# Patient Record
Sex: Female | Born: 1940 | Race: Black or African American | Hispanic: No | Smoking: Never smoker
Health system: Southern US, Community
[De-identification: ages and names within clinical notes are randomized; demographics above are authoritative.]

## PROBLEM LIST (undated history)

## (undated) DIAGNOSIS — F0390 Unspecified dementia without behavioral disturbance: Secondary | ICD-10-CM

## (undated) DIAGNOSIS — N189 Chronic kidney disease, unspecified: Secondary | ICD-10-CM

## (undated) DIAGNOSIS — H269 Unspecified cataract: Secondary | ICD-10-CM

## (undated) DIAGNOSIS — M810 Age-related osteoporosis without current pathological fracture: Secondary | ICD-10-CM

## (undated) DIAGNOSIS — I700 Atherosclerosis of aorta: Secondary | ICD-10-CM

## (undated) DIAGNOSIS — K219 Gastro-esophageal reflux disease without esophagitis: Secondary | ICD-10-CM

## (undated) DIAGNOSIS — I4821 Permanent atrial fibrillation: Secondary | ICD-10-CM

## (undated) DIAGNOSIS — M1990 Unspecified osteoarthritis, unspecified site: Secondary | ICD-10-CM

## (undated) DIAGNOSIS — K5790 Diverticulosis of intestine, part unspecified, without perforation or abscess without bleeding: Secondary | ICD-10-CM

## (undated) DIAGNOSIS — K922 Gastrointestinal hemorrhage, unspecified: Secondary | ICD-10-CM

## (undated) DIAGNOSIS — I10 Essential (primary) hypertension: Secondary | ICD-10-CM

## (undated) DIAGNOSIS — I5189 Other ill-defined heart diseases: Secondary | ICD-10-CM

## (undated) DIAGNOSIS — Z8669 Personal history of other diseases of the nervous system and sense organs: Secondary | ICD-10-CM

## (undated) DIAGNOSIS — I509 Heart failure, unspecified: Secondary | ICD-10-CM

## (undated) DIAGNOSIS — I429 Cardiomyopathy, unspecified: Secondary | ICD-10-CM

## (undated) DIAGNOSIS — I442 Atrioventricular block, complete: Secondary | ICD-10-CM

## (undated) DIAGNOSIS — M109 Gout, unspecified: Secondary | ICD-10-CM

## (undated) DIAGNOSIS — Z5189 Encounter for other specified aftercare: Secondary | ICD-10-CM

## (undated) DIAGNOSIS — IMO0001 Reserved for inherently not codable concepts without codable children: Secondary | ICD-10-CM

## (undated) DIAGNOSIS — T8092XA Unspecified transfusion reaction, initial encounter: Secondary | ICD-10-CM

## (undated) DIAGNOSIS — D509 Iron deficiency anemia, unspecified: Secondary | ICD-10-CM

## (undated) DIAGNOSIS — R011 Cardiac murmur, unspecified: Secondary | ICD-10-CM

## (undated) DIAGNOSIS — Z86010 Personal history of colonic polyps: Secondary | ICD-10-CM

## (undated) DIAGNOSIS — K279 Peptic ulcer, site unspecified, unspecified as acute or chronic, without hemorrhage or perforation: Secondary | ICD-10-CM

## (undated) HISTORY — DX: Unspecified cataract: H26.9

## (undated) HISTORY — PX: CARDIAC PACEMAKER PLACEMENT: SHX583

## (undated) HISTORY — DX: Age-related osteoporosis without current pathological fracture: M81.0

## (undated) HISTORY — DX: Atrioventricular block, complete: I44.2

## (undated) HISTORY — DX: Personal history of other diseases of the nervous system and sense organs: Z86.69

## (undated) HISTORY — DX: Atherosclerosis of aorta: I70.0

## (undated) HISTORY — DX: Personal history of colonic polyps: Z86.010

## (undated) HISTORY — DX: Diverticulosis of intestine, part unspecified, without perforation or abscess without bleeding: K57.90

## (undated) HISTORY — PX: FETAL BLOOD TRANSFUSION: SHX1602

## (undated) HISTORY — DX: Gastro-esophageal reflux disease without esophagitis: K21.9

## (undated) HISTORY — DX: Unspecified dementia, unspecified severity, without behavioral disturbance, psychotic disturbance, mood disturbance, and anxiety: F03.90

## (undated) HISTORY — DX: Other ill-defined heart diseases: I51.89

## (undated) HISTORY — DX: Cardiomyopathy, unspecified: I42.9

## (undated) HISTORY — PX: UPPER GASTROINTESTINAL ENDOSCOPY: SHX188

## (undated) HISTORY — DX: Reserved for inherently not codable concepts without codable children: IMO0001

## (undated) HISTORY — DX: Chronic kidney disease, unspecified: N18.9

## (undated) HISTORY — DX: Essential (primary) hypertension: I10

## (undated) HISTORY — DX: Unspecified transfusion reaction, initial encounter: T80.92XA

## (undated) HISTORY — DX: Encounter for other specified aftercare: Z51.89

## (undated) HISTORY — PX: COLONOSCOPY: SHX174

## (undated) HISTORY — DX: Heart failure, unspecified: I50.9

## (undated) HISTORY — DX: Unspecified osteoarthritis, unspecified site: M19.90

## (undated) HISTORY — DX: Gastrointestinal hemorrhage, unspecified: K92.2

## (undated) HISTORY — DX: Peptic ulcer, site unspecified, unspecified as acute or chronic, without hemorrhage or perforation: K27.9

## (undated) HISTORY — DX: Permanent atrial fibrillation: I48.21

## (undated) HISTORY — DX: Gout, unspecified: M10.9

## (undated) HISTORY — DX: Cardiac murmur, unspecified: R01.1

## (undated) HISTORY — DX: Iron deficiency anemia, unspecified: D50.9

---

## 1993-07-23 HISTORY — PX: BILROTH I PROCEDURE: SHX1231

## 1993-07-23 HISTORY — PX: VAGOTOMY: SUR1431

## 1997-08-24 ENCOUNTER — Ambulatory Visit (HOSPITAL_COMMUNITY): Admission: RE | Admit: 1997-08-24 | Discharge: 1997-08-24 | Payer: Self-pay | Admitting: Obstetrics

## 1997-10-29 ENCOUNTER — Encounter: Admission: RE | Admit: 1997-10-29 | Discharge: 1997-10-29 | Payer: Self-pay | Admitting: Family Medicine

## 1997-11-04 ENCOUNTER — Ambulatory Visit (HOSPITAL_COMMUNITY): Admission: RE | Admit: 1997-11-04 | Discharge: 1997-11-04 | Payer: Self-pay | Admitting: Internal Medicine

## 1997-11-09 ENCOUNTER — Encounter: Admission: RE | Admit: 1997-11-09 | Discharge: 1997-11-09 | Payer: Self-pay | Admitting: Family Medicine

## 1997-11-29 ENCOUNTER — Encounter: Admission: RE | Admit: 1997-11-29 | Discharge: 1997-11-29 | Payer: Self-pay | Admitting: Family Medicine

## 1998-05-16 ENCOUNTER — Encounter: Admission: RE | Admit: 1998-05-16 | Discharge: 1998-05-16 | Payer: Self-pay | Admitting: Family Medicine

## 1998-06-15 ENCOUNTER — Encounter: Admission: RE | Admit: 1998-06-15 | Discharge: 1998-06-15 | Payer: Self-pay | Admitting: Family Medicine

## 1998-08-11 ENCOUNTER — Encounter: Payer: Self-pay | Admitting: Family Medicine

## 1998-08-11 ENCOUNTER — Inpatient Hospital Stay (HOSPITAL_COMMUNITY): Admission: EM | Admit: 1998-08-11 | Discharge: 1998-08-14 | Payer: Self-pay | Admitting: Emergency Medicine

## 1998-10-04 ENCOUNTER — Encounter: Admission: RE | Admit: 1998-10-04 | Discharge: 1998-10-04 | Payer: Self-pay | Admitting: Sports Medicine

## 1998-11-03 ENCOUNTER — Encounter: Admission: RE | Admit: 1998-11-03 | Discharge: 1998-11-03 | Payer: Self-pay | Admitting: Family Medicine

## 1999-01-09 ENCOUNTER — Encounter: Admission: RE | Admit: 1999-01-09 | Discharge: 1999-01-09 | Payer: Self-pay | Admitting: Family Medicine

## 1999-04-04 ENCOUNTER — Ambulatory Visit (HOSPITAL_COMMUNITY): Admission: RE | Admit: 1999-04-04 | Discharge: 1999-04-04 | Payer: Self-pay | Admitting: *Deleted

## 1999-04-24 ENCOUNTER — Encounter: Admission: RE | Admit: 1999-04-24 | Discharge: 1999-04-24 | Payer: Self-pay | Admitting: Family Medicine

## 1999-11-15 ENCOUNTER — Encounter: Admission: RE | Admit: 1999-11-15 | Discharge: 1999-11-15 | Payer: Self-pay | Admitting: Family Medicine

## 1999-12-05 ENCOUNTER — Ambulatory Visit (HOSPITAL_COMMUNITY): Admission: RE | Admit: 1999-12-05 | Discharge: 1999-12-05 | Payer: Self-pay | Admitting: Family Medicine

## 1999-12-07 ENCOUNTER — Encounter: Admission: RE | Admit: 1999-12-07 | Discharge: 1999-12-07 | Payer: Self-pay | Admitting: Family Medicine

## 1999-12-19 ENCOUNTER — Other Ambulatory Visit: Admission: RE | Admit: 1999-12-19 | Discharge: 1999-12-19 | Payer: Self-pay | Admitting: Podiatry

## 1999-12-25 ENCOUNTER — Emergency Department (HOSPITAL_COMMUNITY): Admission: EM | Admit: 1999-12-25 | Discharge: 1999-12-25 | Payer: Self-pay | Admitting: Emergency Medicine

## 1999-12-26 ENCOUNTER — Encounter: Payer: Self-pay | Admitting: Emergency Medicine

## 2000-01-06 ENCOUNTER — Encounter: Payer: Self-pay | Admitting: Emergency Medicine

## 2000-01-06 ENCOUNTER — Emergency Department (HOSPITAL_COMMUNITY): Admission: EM | Admit: 2000-01-06 | Discharge: 2000-01-06 | Payer: Self-pay | Admitting: Emergency Medicine

## 2000-03-12 ENCOUNTER — Encounter: Admission: RE | Admit: 2000-03-12 | Discharge: 2000-03-12 | Payer: Self-pay | Admitting: Family Medicine

## 2000-04-30 ENCOUNTER — Encounter: Admission: RE | Admit: 2000-04-30 | Discharge: 2000-04-30 | Payer: Self-pay | Admitting: Family Medicine

## 2000-05-03 ENCOUNTER — Encounter: Admission: RE | Admit: 2000-05-03 | Discharge: 2000-05-03 | Payer: Self-pay | Admitting: Family Medicine

## 2000-05-31 ENCOUNTER — Encounter: Admission: RE | Admit: 2000-05-31 | Discharge: 2000-05-31 | Payer: Self-pay | Admitting: Family Medicine

## 2000-11-06 ENCOUNTER — Encounter: Admission: RE | Admit: 2000-11-06 | Discharge: 2000-11-06 | Payer: Self-pay | Admitting: Family Medicine

## 2001-01-24 ENCOUNTER — Encounter: Admission: RE | Admit: 2001-01-24 | Discharge: 2001-01-24 | Payer: Self-pay | Admitting: Family Medicine

## 2001-01-29 ENCOUNTER — Ambulatory Visit (HOSPITAL_COMMUNITY): Admission: RE | Admit: 2001-01-29 | Discharge: 2001-01-29 | Payer: Self-pay | Admitting: Family Medicine

## 2001-02-17 ENCOUNTER — Encounter: Admission: RE | Admit: 2001-02-17 | Discharge: 2001-02-17 | Payer: Self-pay | Admitting: Family Medicine

## 2001-09-29 ENCOUNTER — Encounter: Admission: RE | Admit: 2001-09-29 | Discharge: 2001-09-29 | Payer: Self-pay | Admitting: Family Medicine

## 2002-02-24 ENCOUNTER — Encounter: Admission: RE | Admit: 2002-02-24 | Discharge: 2002-02-24 | Payer: Self-pay | Admitting: Sports Medicine

## 2002-03-31 ENCOUNTER — Encounter: Admission: RE | Admit: 2002-03-31 | Discharge: 2002-03-31 | Payer: Self-pay | Admitting: Family Medicine

## 2002-05-05 ENCOUNTER — Encounter: Admission: RE | Admit: 2002-05-05 | Discharge: 2002-05-05 | Payer: Self-pay | Admitting: Sports Medicine

## 2002-06-22 LAB — POC HEMOCCULT BLD/STL (HOME/3-CARD/SCREEN)

## 2002-07-13 ENCOUNTER — Encounter: Admission: RE | Admit: 2002-07-13 | Discharge: 2002-07-13 | Payer: Self-pay | Admitting: Family Medicine

## 2002-07-14 ENCOUNTER — Encounter: Payer: Self-pay | Admitting: Cardiology

## 2002-07-14 ENCOUNTER — Inpatient Hospital Stay (HOSPITAL_COMMUNITY): Admission: AD | Admit: 2002-07-14 | Discharge: 2002-07-22 | Payer: Self-pay | Admitting: Internal Medicine

## 2002-07-14 ENCOUNTER — Encounter: Payer: Self-pay | Admitting: Internal Medicine

## 2002-07-20 ENCOUNTER — Encounter (INDEPENDENT_AMBULATORY_CARE_PROVIDER_SITE_OTHER): Payer: Self-pay | Admitting: *Deleted

## 2002-08-03 ENCOUNTER — Encounter: Admission: RE | Admit: 2002-08-03 | Discharge: 2002-08-03 | Payer: Self-pay | Admitting: Family Medicine

## 2002-08-20 ENCOUNTER — Encounter: Admission: RE | Admit: 2002-08-20 | Discharge: 2002-08-20 | Payer: Self-pay | Admitting: Family Medicine

## 2002-09-16 ENCOUNTER — Encounter: Admission: RE | Admit: 2002-09-16 | Discharge: 2002-09-16 | Payer: Self-pay | Admitting: Family Medicine

## 2002-09-24 ENCOUNTER — Encounter: Payer: Self-pay | Admitting: Family Medicine

## 2002-09-24 ENCOUNTER — Ambulatory Visit (HOSPITAL_COMMUNITY): Admission: RE | Admit: 2002-09-24 | Discharge: 2002-09-24 | Payer: Self-pay | Admitting: Family Medicine

## 2002-11-20 ENCOUNTER — Encounter: Admission: RE | Admit: 2002-11-20 | Discharge: 2002-11-20 | Payer: Self-pay | Admitting: Family Medicine

## 2002-12-22 ENCOUNTER — Encounter (INDEPENDENT_AMBULATORY_CARE_PROVIDER_SITE_OTHER): Payer: Self-pay | Admitting: *Deleted

## 2002-12-31 ENCOUNTER — Encounter: Admission: RE | Admit: 2002-12-31 | Discharge: 2002-12-31 | Payer: Self-pay | Admitting: Family Medicine

## 2003-01-15 ENCOUNTER — Encounter: Admission: RE | Admit: 2003-01-15 | Discharge: 2003-01-15 | Payer: Self-pay | Admitting: Family Medicine

## 2003-02-03 ENCOUNTER — Encounter: Admission: RE | Admit: 2003-02-03 | Discharge: 2003-02-03 | Payer: Self-pay | Admitting: Family Medicine

## 2003-03-19 ENCOUNTER — Encounter: Admission: RE | Admit: 2003-03-19 | Discharge: 2003-03-19 | Payer: Self-pay | Admitting: Family Medicine

## 2003-04-21 ENCOUNTER — Encounter: Admission: RE | Admit: 2003-04-21 | Discharge: 2003-04-21 | Payer: Self-pay | Admitting: Family Medicine

## 2003-05-17 ENCOUNTER — Encounter: Admission: RE | Admit: 2003-05-17 | Discharge: 2003-05-17 | Payer: Self-pay | Admitting: Family Medicine

## 2003-08-07 ENCOUNTER — Inpatient Hospital Stay (HOSPITAL_COMMUNITY): Admission: EM | Admit: 2003-08-07 | Discharge: 2003-08-09 | Payer: Self-pay | Admitting: Emergency Medicine

## 2003-08-13 ENCOUNTER — Encounter: Admission: RE | Admit: 2003-08-13 | Discharge: 2003-08-13 | Payer: Self-pay | Admitting: Family Medicine

## 2003-09-17 ENCOUNTER — Encounter: Admission: RE | Admit: 2003-09-17 | Discharge: 2003-09-17 | Payer: Self-pay | Admitting: Family Medicine

## 2003-10-01 ENCOUNTER — Encounter: Admission: RE | Admit: 2003-10-01 | Discharge: 2003-10-01 | Payer: Self-pay | Admitting: Family Medicine

## 2004-01-14 ENCOUNTER — Encounter: Admission: RE | Admit: 2004-01-14 | Discharge: 2004-01-14 | Payer: Self-pay | Admitting: Sports Medicine

## 2004-01-17 ENCOUNTER — Encounter: Admission: RE | Admit: 2004-01-17 | Discharge: 2004-01-17 | Payer: Self-pay | Admitting: Sports Medicine

## 2004-02-15 ENCOUNTER — Encounter: Admission: RE | Admit: 2004-02-15 | Discharge: 2004-02-15 | Payer: Self-pay | Admitting: Sports Medicine

## 2004-04-06 ENCOUNTER — Ambulatory Visit: Payer: Self-pay | Admitting: Family Medicine

## 2004-11-22 ENCOUNTER — Ambulatory Visit: Payer: Self-pay | Admitting: Cardiology

## 2005-02-19 ENCOUNTER — Ambulatory Visit: Payer: Self-pay | Admitting: Sports Medicine

## 2005-02-23 ENCOUNTER — Ambulatory Visit: Payer: Self-pay | Admitting: Cardiology

## 2005-02-23 ENCOUNTER — Ambulatory Visit: Payer: Self-pay | Admitting: Family Medicine

## 2005-03-02 ENCOUNTER — Ambulatory Visit: Payer: Self-pay | Admitting: Family Medicine

## 2005-03-09 ENCOUNTER — Ambulatory Visit: Payer: Self-pay | Admitting: Family Medicine

## 2005-03-16 ENCOUNTER — Ambulatory Visit: Payer: Self-pay | Admitting: Family Medicine

## 2005-03-23 ENCOUNTER — Ambulatory Visit: Payer: Self-pay | Admitting: Sports Medicine

## 2005-03-30 ENCOUNTER — Ambulatory Visit: Payer: Self-pay | Admitting: Family Medicine

## 2005-04-13 ENCOUNTER — Ambulatory Visit: Payer: Self-pay | Admitting: Family Medicine

## 2005-04-30 ENCOUNTER — Ambulatory Visit: Payer: Self-pay | Admitting: Family Medicine

## 2005-05-21 ENCOUNTER — Ambulatory Visit: Payer: Self-pay | Admitting: Sports Medicine

## 2005-05-22 ENCOUNTER — Ambulatory Visit: Payer: Self-pay | Admitting: Family Medicine

## 2005-08-22 ENCOUNTER — Ambulatory Visit (HOSPITAL_COMMUNITY): Admission: RE | Admit: 2005-08-22 | Discharge: 2005-08-22 | Payer: Self-pay | Admitting: Sports Medicine

## 2005-08-23 DIAGNOSIS — K279 Peptic ulcer, site unspecified, unspecified as acute or chronic, without hemorrhage or perforation: Secondary | ICD-10-CM

## 2005-08-23 HISTORY — DX: Peptic ulcer, site unspecified, unspecified as acute or chronic, without hemorrhage or perforation: K27.9

## 2005-09-16 ENCOUNTER — Ambulatory Visit: Payer: Self-pay | Admitting: Gastroenterology

## 2005-09-16 ENCOUNTER — Inpatient Hospital Stay (HOSPITAL_COMMUNITY): Admission: EM | Admit: 2005-09-16 | Discharge: 2005-09-19 | Payer: Self-pay | Admitting: Emergency Medicine

## 2005-09-16 ENCOUNTER — Ambulatory Visit: Payer: Self-pay | Admitting: Family Medicine

## 2005-10-01 ENCOUNTER — Ambulatory Visit: Payer: Self-pay | Admitting: Sports Medicine

## 2005-11-20 DIAGNOSIS — K5790 Diverticulosis of intestine, part unspecified, without perforation or abscess without bleeding: Secondary | ICD-10-CM

## 2005-11-20 HISTORY — DX: Diverticulosis of intestine, part unspecified, without perforation or abscess without bleeding: K57.90

## 2005-12-13 ENCOUNTER — Ambulatory Visit: Payer: Self-pay | Admitting: Cardiology

## 2005-12-19 ENCOUNTER — Ambulatory Visit (HOSPITAL_COMMUNITY): Admission: RE | Admit: 2005-12-19 | Discharge: 2005-12-19 | Payer: Self-pay | Admitting: Gastroenterology

## 2006-05-27 ENCOUNTER — Ambulatory Visit: Payer: Self-pay

## 2006-08-28 ENCOUNTER — Ambulatory Visit: Payer: Self-pay | Admitting: Cardiology

## 2006-09-19 DIAGNOSIS — IMO0001 Reserved for inherently not codable concepts without codable children: Secondary | ICD-10-CM

## 2006-09-19 DIAGNOSIS — I428 Other cardiomyopathies: Secondary | ICD-10-CM

## 2006-09-19 DIAGNOSIS — M545 Low back pain: Secondary | ICD-10-CM

## 2006-09-19 DIAGNOSIS — K219 Gastro-esophageal reflux disease without esophagitis: Secondary | ICD-10-CM

## 2006-09-19 DIAGNOSIS — M1710 Unilateral primary osteoarthritis, unspecified knee: Secondary | ICD-10-CM

## 2006-09-19 DIAGNOSIS — IMO0002 Reserved for concepts with insufficient information to code with codable children: Secondary | ICD-10-CM | POA: Insufficient documentation

## 2006-09-19 DIAGNOSIS — M2540 Effusion, unspecified joint: Secondary | ICD-10-CM | POA: Insufficient documentation

## 2006-09-19 DIAGNOSIS — D509 Iron deficiency anemia, unspecified: Secondary | ICD-10-CM

## 2006-09-20 ENCOUNTER — Encounter (INDEPENDENT_AMBULATORY_CARE_PROVIDER_SITE_OTHER): Payer: Self-pay | Admitting: *Deleted

## 2006-11-27 ENCOUNTER — Ambulatory Visit: Payer: Self-pay | Admitting: Cardiology

## 2007-01-13 ENCOUNTER — Ambulatory Visit: Payer: Self-pay | Admitting: Cardiology

## 2007-04-16 ENCOUNTER — Ambulatory Visit: Payer: Self-pay | Admitting: Cardiology

## 2007-05-26 ENCOUNTER — Ambulatory Visit: Payer: Self-pay

## 2007-06-25 ENCOUNTER — Encounter (INDEPENDENT_AMBULATORY_CARE_PROVIDER_SITE_OTHER): Payer: Self-pay | Admitting: *Deleted

## 2007-07-14 ENCOUNTER — Telehealth: Payer: Self-pay | Admitting: *Deleted

## 2007-07-21 ENCOUNTER — Ambulatory Visit: Payer: Self-pay | Admitting: Family Medicine

## 2007-08-19 ENCOUNTER — Ambulatory Visit: Payer: Self-pay | Admitting: Family Medicine

## 2007-08-22 ENCOUNTER — Ambulatory Visit (HOSPITAL_COMMUNITY): Admission: RE | Admit: 2007-08-22 | Discharge: 2007-08-22 | Payer: Self-pay | Admitting: Family Medicine

## 2007-08-27 ENCOUNTER — Ambulatory Visit: Payer: Self-pay | Admitting: Cardiology

## 2007-09-18 ENCOUNTER — Encounter: Payer: Self-pay | Admitting: Family Medicine

## 2007-09-18 ENCOUNTER — Ambulatory Visit: Payer: Self-pay | Admitting: Family Medicine

## 2007-09-18 LAB — CONVERTED CEMR LAB
BUN: 17 mg/dL (ref 6–23)
Calcium: 8.7 mg/dL (ref 8.4–10.5)
Cholesterol: 139 mg/dL (ref 0–200)
Creatinine, Ser: 0.81 mg/dL (ref 0.40–1.20)
Glucose, Bld: 62 mg/dL — ABNORMAL LOW (ref 70–99)
HCT: 39.6 % (ref 36.0–46.0)
Hemoglobin: 11.8 g/dL — ABNORMAL LOW (ref 12.0–15.0)
MCHC: 29.8 g/dL — ABNORMAL LOW (ref 30.0–36.0)
MCV: 96.4 fL (ref 78.0–100.0)
Potassium: 3.6 meq/L (ref 3.5–5.3)
RDW: 14.6 % (ref 11.5–15.5)
Triglycerides: 82 mg/dL (ref <150)

## 2007-09-22 ENCOUNTER — Encounter: Payer: Self-pay | Admitting: Family Medicine

## 2007-10-01 ENCOUNTER — Ambulatory Visit (HOSPITAL_COMMUNITY): Admission: RE | Admit: 2007-10-01 | Discharge: 2007-10-01 | Payer: Self-pay | Admitting: Family Medicine

## 2007-10-01 ENCOUNTER — Ambulatory Visit: Payer: Self-pay | Admitting: Family Medicine

## 2007-10-01 DIAGNOSIS — R42 Dizziness and giddiness: Secondary | ICD-10-CM

## 2007-10-06 ENCOUNTER — Encounter: Payer: Self-pay | Admitting: Family Medicine

## 2007-12-22 ENCOUNTER — Ambulatory Visit: Payer: Self-pay | Admitting: Cardiology

## 2007-12-30 ENCOUNTER — Ambulatory Visit: Payer: Self-pay | Admitting: Family Medicine

## 2008-03-24 ENCOUNTER — Ambulatory Visit: Payer: Self-pay | Admitting: Cardiology

## 2008-07-06 ENCOUNTER — Ambulatory Visit: Payer: Self-pay | Admitting: Cardiology

## 2008-07-08 ENCOUNTER — Ambulatory Visit: Payer: Self-pay | Admitting: Family Medicine

## 2008-07-08 DIAGNOSIS — R040 Epistaxis: Secondary | ICD-10-CM

## 2008-07-09 ENCOUNTER — Emergency Department (HOSPITAL_COMMUNITY): Admission: EM | Admit: 2008-07-09 | Discharge: 2008-07-09 | Payer: Self-pay | Admitting: Emergency Medicine

## 2008-07-10 ENCOUNTER — Emergency Department (HOSPITAL_COMMUNITY): Admission: EM | Admit: 2008-07-10 | Discharge: 2008-07-10 | Payer: Self-pay | Admitting: Emergency Medicine

## 2008-09-01 ENCOUNTER — Ambulatory Visit (HOSPITAL_COMMUNITY): Admission: RE | Admit: 2008-09-01 | Discharge: 2008-09-01 | Payer: Self-pay | Admitting: Family Medicine

## 2008-09-10 ENCOUNTER — Ambulatory Visit (HOSPITAL_COMMUNITY): Admission: RE | Admit: 2008-09-10 | Discharge: 2008-09-10 | Payer: Self-pay | Admitting: Family Medicine

## 2008-10-05 ENCOUNTER — Telehealth: Payer: Self-pay | Admitting: Family Medicine

## 2008-10-05 ENCOUNTER — Ambulatory Visit: Payer: Self-pay | Admitting: Family Medicine

## 2008-10-05 DIAGNOSIS — M25569 Pain in unspecified knee: Secondary | ICD-10-CM

## 2008-10-11 ENCOUNTER — Ambulatory Visit: Payer: Self-pay | Admitting: Cardiology

## 2008-11-11 ENCOUNTER — Encounter (INDEPENDENT_AMBULATORY_CARE_PROVIDER_SITE_OTHER): Payer: Self-pay | Admitting: *Deleted

## 2008-12-22 DIAGNOSIS — I10 Essential (primary) hypertension: Secondary | ICD-10-CM

## 2008-12-22 DIAGNOSIS — I4821 Permanent atrial fibrillation: Secondary | ICD-10-CM

## 2008-12-22 DIAGNOSIS — Z950 Presence of cardiac pacemaker: Secondary | ICD-10-CM

## 2008-12-22 DIAGNOSIS — Z8711 Personal history of peptic ulcer disease: Secondary | ICD-10-CM

## 2008-12-22 DIAGNOSIS — I442 Atrioventricular block, complete: Secondary | ICD-10-CM

## 2008-12-22 DIAGNOSIS — I5032 Chronic diastolic (congestive) heart failure: Secondary | ICD-10-CM

## 2008-12-22 DIAGNOSIS — Z8679 Personal history of other diseases of the circulatory system: Secondary | ICD-10-CM | POA: Insufficient documentation

## 2008-12-23 ENCOUNTER — Ambulatory Visit: Payer: Self-pay | Admitting: Cardiology

## 2009-01-05 ENCOUNTER — Encounter: Payer: Self-pay | Admitting: Cardiology

## 2009-01-05 ENCOUNTER — Ambulatory Visit: Payer: Self-pay

## 2009-01-10 ENCOUNTER — Telehealth: Payer: Self-pay | Admitting: Cardiology

## 2009-03-28 ENCOUNTER — Ambulatory Visit: Payer: Self-pay | Admitting: Cardiology

## 2009-04-22 DIAGNOSIS — K922 Gastrointestinal hemorrhage, unspecified: Secondary | ICD-10-CM

## 2009-04-22 HISTORY — DX: Gastrointestinal hemorrhage, unspecified: K92.2

## 2009-04-29 ENCOUNTER — Encounter: Payer: Self-pay | Admitting: Family Medicine

## 2009-04-29 ENCOUNTER — Ambulatory Visit: Payer: Self-pay | Admitting: Family Medicine

## 2009-04-29 ENCOUNTER — Ambulatory Visit: Payer: Self-pay | Admitting: Internal Medicine

## 2009-04-29 ENCOUNTER — Inpatient Hospital Stay (HOSPITAL_COMMUNITY): Admission: EM | Admit: 2009-04-29 | Discharge: 2009-05-02 | Payer: Self-pay | Admitting: Emergency Medicine

## 2009-04-30 ENCOUNTER — Encounter: Payer: Self-pay | Admitting: Internal Medicine

## 2009-05-02 ENCOUNTER — Encounter: Payer: Self-pay | Admitting: Family Medicine

## 2009-05-05 ENCOUNTER — Ambulatory Visit: Payer: Self-pay | Admitting: Family Medicine

## 2009-05-05 DIAGNOSIS — L259 Unspecified contact dermatitis, unspecified cause: Secondary | ICD-10-CM

## 2009-05-06 ENCOUNTER — Encounter: Payer: Self-pay | Admitting: Cardiology

## 2009-06-14 ENCOUNTER — Ambulatory Visit: Payer: Self-pay | Admitting: Family Medicine

## 2009-06-14 DIAGNOSIS — L293 Anogenital pruritus, unspecified: Secondary | ICD-10-CM

## 2009-06-29 ENCOUNTER — Encounter: Payer: Self-pay | Admitting: Internal Medicine

## 2009-07-07 ENCOUNTER — Encounter: Payer: Self-pay | Admitting: Internal Medicine

## 2009-09-30 ENCOUNTER — Encounter (INDEPENDENT_AMBULATORY_CARE_PROVIDER_SITE_OTHER): Payer: Self-pay | Admitting: *Deleted

## 2009-10-03 ENCOUNTER — Ambulatory Visit: Payer: Self-pay | Admitting: Cardiology

## 2009-10-03 ENCOUNTER — Telehealth: Payer: Self-pay | Admitting: Cardiology

## 2009-10-11 ENCOUNTER — Inpatient Hospital Stay (HOSPITAL_COMMUNITY): Admission: EM | Admit: 2009-10-11 | Discharge: 2009-10-13 | Payer: Self-pay | Admitting: Emergency Medicine

## 2009-10-11 ENCOUNTER — Ambulatory Visit: Payer: Self-pay | Admitting: Internal Medicine

## 2009-10-12 ENCOUNTER — Encounter: Payer: Self-pay | Admitting: Cardiology

## 2009-10-12 DIAGNOSIS — I5189 Other ill-defined heart diseases: Secondary | ICD-10-CM

## 2009-10-12 HISTORY — DX: Other ill-defined heart diseases: I51.89

## 2009-11-08 ENCOUNTER — Encounter: Payer: Self-pay | Admitting: Cardiology

## 2009-11-09 ENCOUNTER — Ambulatory Visit: Payer: Self-pay | Admitting: Cardiology

## 2009-11-14 ENCOUNTER — Telehealth: Payer: Self-pay | Admitting: Cardiology

## 2009-11-14 LAB — CONVERTED CEMR LAB
Basophils Absolute: 0 10*3/uL (ref 0.0–0.1)
Basophils Relative: 0.3 % (ref 0.0–3.0)
Eosinophils Absolute: 0.2 10*3/uL (ref 0.0–0.7)
HCT: 38 % (ref 36.0–46.0)
Hemoglobin: 12.4 g/dL (ref 12.0–15.0)
Lymphs Abs: 1.3 10*3/uL (ref 0.7–4.0)
MCHC: 32.7 g/dL (ref 30.0–36.0)
MCV: 91.6 fL (ref 78.0–100.0)
Neutro Abs: 5.2 10*3/uL (ref 1.4–7.7)
RBC: 4.15 M/uL (ref 3.87–5.11)
RDW: 14.6 % (ref 11.5–14.6)
Transferrin: 290.2 mg/dL (ref 212.0–360.0)

## 2009-11-24 ENCOUNTER — Telehealth: Payer: Self-pay | Admitting: Family Medicine

## 2009-11-25 ENCOUNTER — Ambulatory Visit: Payer: Self-pay | Admitting: Family Medicine

## 2009-11-25 ENCOUNTER — Encounter: Payer: Self-pay | Admitting: Family Medicine

## 2010-02-06 ENCOUNTER — Ambulatory Visit: Payer: Self-pay | Admitting: Cardiology

## 2010-03-28 ENCOUNTER — Ambulatory Visit: Payer: Self-pay | Admitting: Family Medicine

## 2010-03-28 ENCOUNTER — Encounter: Payer: Self-pay | Admitting: Family Medicine

## 2010-03-28 LAB — CONVERTED CEMR LAB
BUN: 13 mg/dL (ref 6–23)
CO2: 24 meq/L (ref 19–32)
Chloride: 100 meq/L (ref 96–112)
Creatinine, Ser: 0.96 mg/dL (ref 0.40–1.20)
Potassium: 3.7 meq/L (ref 3.5–5.3)

## 2010-03-30 ENCOUNTER — Ambulatory Visit: Payer: Self-pay | Admitting: Family Medicine

## 2010-03-30 ENCOUNTER — Telehealth: Payer: Self-pay | Admitting: Family Medicine

## 2010-03-30 DIAGNOSIS — Z8669 Personal history of other diseases of the nervous system and sense organs: Secondary | ICD-10-CM

## 2010-03-30 DIAGNOSIS — R634 Abnormal weight loss: Secondary | ICD-10-CM | POA: Insufficient documentation

## 2010-04-03 ENCOUNTER — Encounter: Payer: Self-pay | Admitting: Family Medicine

## 2010-04-05 ENCOUNTER — Ambulatory Visit: Payer: Self-pay | Admitting: Family Medicine

## 2010-04-05 ENCOUNTER — Encounter: Payer: Self-pay | Admitting: Family Medicine

## 2010-04-05 ENCOUNTER — Inpatient Hospital Stay (HOSPITAL_COMMUNITY): Admission: EM | Admit: 2010-04-05 | Discharge: 2010-04-18 | Payer: Self-pay | Admitting: Emergency Medicine

## 2010-04-16 LAB — CONVERTED CEMR LAB
BUN: 6 mg/dL
Creatinine, Ser: 0.8 mg/dL
Glucose, Bld: 101 mg/dL
Hemoglobin: 10 g/dL — ABNORMAL LOW
Potassium: 3.8 meq/L
RBC: 3.44 M/uL — ABNORMAL LOW
WBC: 7.6 10*3/uL

## 2010-04-24 ENCOUNTER — Ambulatory Visit: Payer: Self-pay | Admitting: Family Medicine

## 2010-04-25 ENCOUNTER — Encounter: Payer: Self-pay | Admitting: Family Medicine

## 2010-05-01 ENCOUNTER — Ambulatory Visit (HOSPITAL_COMMUNITY): Admission: RE | Admit: 2010-05-01 | Discharge: 2010-05-01 | Payer: Self-pay | Admitting: Family Medicine

## 2010-05-03 ENCOUNTER — Encounter: Payer: Self-pay | Admitting: Cardiology

## 2010-05-04 ENCOUNTER — Encounter
Admission: RE | Admit: 2010-05-04 | Discharge: 2010-08-02 | Payer: Self-pay | Source: Home / Self Care | Attending: Neurology | Admitting: Neurology

## 2010-05-11 ENCOUNTER — Encounter: Payer: Self-pay | Admitting: Internal Medicine

## 2010-05-11 ENCOUNTER — Ambulatory Visit: Payer: Self-pay | Admitting: Cardiology

## 2010-05-26 ENCOUNTER — Encounter (INDEPENDENT_AMBULATORY_CARE_PROVIDER_SITE_OTHER): Payer: Self-pay | Admitting: *Deleted

## 2010-08-10 ENCOUNTER — Ambulatory Visit
Admission: RE | Admit: 2010-08-10 | Discharge: 2010-08-10 | Payer: Self-pay | Source: Home / Self Care | Attending: Internal Medicine | Admitting: Internal Medicine

## 2010-08-10 ENCOUNTER — Encounter: Payer: Self-pay | Admitting: Internal Medicine

## 2010-08-13 ENCOUNTER — Encounter: Payer: Self-pay | Admitting: Family Medicine

## 2010-08-22 NOTE — Assessment & Plan Note (Signed)
Summary: h/fup,tcb   Vital Signs:  Patient profile:   70 year old female Height:      63 inches Weight:      109.25 pounds BMI:     19.42 BSA:     1.50 Temp:     98.8 degrees F Pulse rate:   59 / minute BP sitting:   134 / 72  Vitals Entered By: Jone Baseman CMA (April 24, 2010 4:29 PM) CC: HFU Is Patient Diabetic? No Pain Assessment Patient in pain? no        Primary Care Provider:  . [facility]  CC:  HFU.  History of Present Illness: Guillain-Barre syndrome ( Acute demyelinating polyneuropathy) Ms Batson is coming in to [facility] for follow-up of her recent [facility] admission in consultation with [facility] for  GB syndrome. She was treated with several rounds of plasmaphoresis with good response. She had a keft IJ central line at that time.  she was accompanied by her dgt to this OV history and physical. Currently she is at home with her dgt.  she feels much less unsteady with walking, though she uses a rolling walker for assistance. Her hearing has returned as has her appetite.  the "pins and needles" feeling in her feet is resolved. She will see [facility] in follow-up outpt consultation tomorrow.       Habits & Providers  Alcohol-Tobacco-Diet     Alcohol drinks/day: 0     Tobacco Status: never  Current Problems (verified): 1)  Weight Loss, Abnormal  (ICD-783.21) 2)  Guillain-barre Syndrome, Hx of  (ICD-V12.49) 3)  Acquired Hemolytic Anemia Unspecified  (ICD-283.9) 4)  Genital Pruritus  (ICD-698.1) 5)  Dermatitis  (ICD-692.9) 6)  Diverticulosis, Colon, Hx of  (ICD-V12.79) 7)  Palpitations, Hx of  (ICD-V12.50) 8)  Pacemaker, Permanent  (ICD-V45.01) 9)  Gastrointestinal Hemorrhage, Hx of  (ICD-V12.79) 10)  Peptic Ulcer Disease, Hx of  (ICD-V12.71) 11)  Atrial Fibrillation, Paroxysmal  (ICD-427.31) 12)  Hypertension  (ICD-401.9) 13)  Av Block, Complete  (ICD-426.0) 14)  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  (ICD-280.9)  Current Medications (verified): 1)  Metoprolol Succinate 100 Mg Xr24h-Tab (Metoprolol Succinate) .... Take One Tablet By Mouth Daily 2)  Amlodipine Besylate 10 Mg  Tabs (Amlodipine Besylate) .Marland Kitchen.. 1 Tab Once Daily 3)  Tramadol Hcl 50 Mg Tabs (Tramadol Hcl) .Marland Kitchen.. 1 By Mouth Three Times A Day As Needed Pain 4)  Multivitamins  Caps (Multiple Vitamin) .Marland Kitchen.. 1 Tablet By Mouth Daily  Allergies (verified): 1)  ! Aspirin 2)  ! Caffeine Extra Strength 3)  ! Orphenadrine Citrate (Orphenadrine Citrate)  Past History:  Past Medical History: Guillain-Barre Syndrome, History of DIVERTICULOSIS, COLON, HX OF (ICD-V12.79) - Diag 11/2005 PALPITATIONS, HX OF (ICD-V12.50) PACEMAKER, PERMANENT (ICD-V45.01) - placed in 2003 GASTROINTESTINAL HEMORRHAGE, HX OF (ICD-V12.79) - last one in Feb 2007 PEPTIC ULCER DISEASE, HX OF (ICD-V12.71) - in Feb 2007 ATRIAL FIBRILLATION, PAROXYSMAL (ICD-427.31) - not a coumadin candidate due to GI bleed HYPERTENSION (ICD-401.9) AV BLOCK, COMPLETE (ICD-426.0) - s/p pacemaker placement  HYPERTROPHIC CARDIOMYOPATHY (ICD-425.1) - Last ECHO 01/05/09: EF 65 -70%, mod concentric LV hypertrophy, mod AR, severe TR  1. Complete AV block. 2. Status post Medtronic dual chamber pacemaker implantation in 2003     with good pacer function, not pacer dependent. 3. Hypertrophic cardiomyopathy. Echo 09/14/09: Severe asymmetric hypertrophy with vigorous left ventricular      ejection fraction and no dynamic  obstruction at rest (Valsalva not performed), (wall motion normal), grade 1      diastolic dysfunction, 4. Hypertension. 5. Paroxysmal atrial fibrillation, not felt to be a Coumadin     candidate. 6. Peptic ulcer disease with a history of gastrointestinal bleed (most recent 10/10) requiring transfusion     Physical Exam  General:  alert.  Cooperative, slowly walking with rolling walker without sign of imbalance.  Chest Wall:  1 cm left precordial skin would with healing surface and 3 mm punctum at site of prior plasmapheresis catheter.  Flat, Nontender, nonfluctuant, no expressible discharge, no edema.   Lungs:  normal respiratory effort, normal breath sounds, no crackles, and no wheezes.   Heart:  normal rate.     Impression & Recommendations:  Problem # 1:  GUILLAIN-BARRE SYNDROME, HX OF (ICD-V12.49) Assessment Improved  Much improved paresthetia and ataxia and hearing loss.  Using rolling walker at present.  Follow-up at [facility] tomorrow. No planned further monitoring or intervention by [place] at this time.  Follow up as needed.   Orders: [facility]- Est Level  3 ([address])  Problem # 2:  WEIGHT LOSS, ABNORMAL (ICD-783.21) Assessment: Improved  Weight up 7 pounds from last office visit associated with return of appetitie with resolving GB syndrome.  Patient is up to date on colorectal cancer screening.  She is will to go for screening mammography.    Orders: [facility]- Est Level  3 (60454)  Problem # 3:  Preventive Health Care (ICD-V70.0) Patient declined pneumovax and Zostavax.  She is not a candidate for Influenza vaccination given hx of Gullain Barre Syndrome.   Complete Medication List: 1)  Metoprolol Succinate 100 Mg Xr24h-tab (Metoprolol succinate) .... Take one tablet by mouth daily 2)  Amlodipine Besylate 10 Mg Tabs (Amlodipine besylate) .Marland Kitchen.. 1 tab once daily 3)  Tramadol Hcl 50 Mg Tabs (Tramadol hcl) .Marland Kitchen.. 1 by mouth three times a day as needed pain 4)  Multivitamins Caps  (Multiple vitamin) .Marland Kitchen.. 1 tablet by mouth daily  Contraindications/Deferment of Procedures/Staging:    Treatment: Flu Shot    Contraindication: other     Test/Procedure: Pneumovax vaccine    Reason for deferment: patient declined     Test/Procedure: Zoster vaccine    Reason for deferment: declined

## 2010-08-22 NOTE — Letter (Signed)
Summary: Device-Delinquent Phone Journalist, newspaper, Main Office  [address]   [place], [place] [address]   Phone: [phone]  Fax: [phone]     September 30, 2009 MRN: [id number]   [name] [address] [place], [place]  [address]   Dear [name],  According to our records, you were scheduled for a device phone transmission on                              .  09/28/2009   We did not receive any results from this check.  If you transmitted on your scheduled day, please call us to help troubleshoot your system.  If you forgot to send your transmission, please send one upon receipt of this letter.  Thank you,  Altha Harm, LPN  September 30, 2009 8:51 AM  [facility]

## 2010-08-22 NOTE — Assessment & Plan Note (Signed)
Summary: Unsteady walking and decreased hearing   Vital Signs:  Patient profile:   70 year old female Height:      63 inches Weight:      102 pounds BMI:     18.13 BSA:     1.45 Temp:     97.4 degrees F Pulse rate:   89 / minute BP sitting:   167 / 126  (left arm)  Vitals Entered By: Jone Baseman CMA (March 30, 2010 11:21 AM)  Serial Vital Signs/Assessments:  Comments: 11:22 AM Manual BP: Right 158/104 and Left 150/108 ............................................... Delora Fuel March 30, 2010 11:22 AM  By: Jone Baseman CMA   Is Patient Diabetic? No Pain Assessment Patient in pain? no        Primary Care Provider:  . [facility]   History of Present Illness: Unsteady walking  Patient accompanied by a daughter.  Both pt and dgt were historians for the interview.  Both seemed able to relate a coherent narrative of recent events.  The dgt seemed more concerned about the changes in the patient than the patient herself.   Onset one day ago. Difficulty getting out of bed this morning because of sense of unsteadiness.   Patient was working full time at a [employer] until about a month ago when she retired.  No history of previous unsteady gait.   No alcohol, tobacco or illicit drug use.  She is taking her prescription medications as prescribed.  Denies new medication/herbals/vitamins/supplements.  Recent upper respiratory illness that primarily involved head and chest about three weeks ago.  Patient curtailed her usual activity significantly since the illness. .  No gastrointesitnal acute illness symptoms. Patient has a documented weight loss of over 20 pounds since May 2011.  Patient's dgt says patient not eating very much since recent illness.   ROS: No rash, no veritgo, no headache, no limb weakness, no dysphagia or odynophagia, no sz, no change in vision, no diplopia, no syncope, no change in speech or language, no sores in mouth, no melema or  hematochezia, no chest pain or shortness of breath, no dysuria, no hematuria, no change in her chronic knee pain, no new joint swelling or redness or pain, no fever/chills/night sweats. No back pain.  No urinary incontinence or difficulty emptying her bladder.  No saddle dysaesthesia  Patient denies being told she has nystagmus in past.  Decrease in hearing Dgt noticed patient not hearing as well as previously over the last couploe days.  TV turned up louder.  Patient noticed that not hearing as well from left ear when listening on phone yesterday. No tinnitus. No vertigo.   No recent new medications/herbals/supplements/vitamins. No drainage from ear. No otalgia No headache.   Hx of similar event in past which improved after removal of ear wax.    Habits & Providers  Alcohol-Tobacco-Diet     Alcohol drinks/day: 0     Tobacco Status: never  Current Medications (verified): 1)  Metoprolol Succinate 100 Mg Xr24h-Tab (Metoprolol Succinate) .... Take One Tablet By Mouth Daily 2)  Amlodipine Besylate 10 Mg  Tabs (Amlodipine Besylate) .Marland Kitchen.. 1 Tab Once Daily 3)  Tynleol .... Prn 4)  Ferrous Sulfate 325 (65 Fe) Mg Tabs (Ferrous Sulfate) .... Take One Tab By Mouth Two Times A Day With Meals. 5)  Tramadol Hcl 50 Mg Tabs (Tramadol Hcl) .Marland Kitchen.. 1 By Mouth Three Times A Day As Needed Pain  Allergies (verified): 1)  ! Aspirin 2)  ! Caffeine Extra  Strength 3)  ! Orphenadrine Citrate (Orphenadrine Citrate)  Past History:  Social History: Last updated: 03/30/2010 Lives with her daughter. Retired from working at  [employer] in July 2011. No smoking, alcohol, or drug use.  2 daughters Nettie Elm - lives with) and Marylene Land, 1 son.  Occasional sexual activity.  Divorced, but ex-husband died in 08-14-22 of cancer. PMH reviewed for relevance  Social History: Lives with her daughter. Retired from working at  [employer] in July 2011. No smoking, alcohol, or drug use.  2 daughters Nettie Elm  - lives with) and Marylene Land, 1 son.  Occasional sexual activity.  Divorced, but ex-husband died in Aug 14, 2022 of cancer.  Physical Exam  General:  Mental Status: Serial sevens form 100 to 65 without error.  Oriented to year, month, date, day, location.  Able to tie the drawstring on her pants easily.  Mood and affect congruent. Language is semantically meaningful and pragmatically apt.   Head:  atraumatic.   Eyes:  pupils equal, pupils round, and pupils reactive to light.  Horizontal nystagmus in both eyes with patient looking straight ahead.   Ears:  R. ear with bubbles behind TM, transparent TMs. Left ear normal TM R. Tympanogram with low peak pressure consistent with MEE Weber: localizes to Right ear Rhinne: AC>BC bilaterally. L. Tympanogram normal  Neck:  supple, full ROM, and no masses.   no carotid bruits Lungs:  normal respiratory effort, no intercostal retractions, and normal breath sounds.   Heart:  normal rate and regular rhythm.   2/6 SEM LUSB  no gallup No JVD Normal PMI Msk:  Enlarged boney medial knees bilaterally without erythema and exudate Pulses:  Palpable R and L dorsalis pedal pulses Palpable R and L post tibial pulses bilaterally. Hair on anterior shins bilaterally  Extremities:  No edema  Neurologic:  CN: Intact CN II thru CN XII except horizontal nystagmus with looking straing ahead.   Gait: walked into exam room with 1-point cane in right hand and dgt holding her left elbow.   Unsteady gait without cane and assistance.  Right foot crossing midline with swing and patient subsequently falling towards left.  Sensory: monofilament decrease monofilament in all toes bilaterally and right foot dorsum.  Intact bilaterally at and above ankles Pinprick: absent toes bilaterally and right foot dorsum.  Intact bilaterally at and above ankles. Vibration: intact at [facility] bilaterally Proprioception: intact at [employer] bilaterally  Motor: 5/5 strenght bilateral upper  extremities 4(+)/5 knee extension and ankle dorsiflexion bilaterally 5/5 knee flexion and ankle plantar flexion bilaterally. Normal muscle tone in arms and legs, no tremor or clonus  DTR: ankles absent bilaterally.  Knees (+)1 bilaterally, (+)1 at biceps and triceps. Babinski: Down going toes bilaterally  Cerebellar: Normal Finger to nose bilaterally; Normal heel to shim bilaterally.  Rhomberg: Swaying with eyes closed.   Psych:  Oriented X3, memory intact for recent and remote, normally interactive, good eye contact, not anxious appearing, and not depressed appearing.     Impression & Recommendations:  Problem # 1:  ATAXIA (ICD-781.3) Complicated presentation with acute onset of a bilateral stocking distribution positive paresthetia of feet followed by an acute ataxia with normal motor strength.  Unprovoked horizontal nystagmus without vertigo of unknow duration noted on exam.   If ignore the nystagmus, a sensory ataxia from whatever the cause of the paresthetia is possible explanation of temporally close findings.   If include the nystagmus then cerebellar or vestibular ataxia would be general domain of  differential.  She does have Middle ear effusion following her recent URI that could result in a vestibluar ataxia and the loss of hearing in the right ear on sweep audiometry today.  Given the possiblity of cerebellar ataxia and the acute paresthetia that can involve immunologic, infectious or neoplastic orign, patient referred for acute consultation with Dr Terrace Arabia at [facility] today at 2:30 pm.   Referral letter and recent labs sent to consult.    Problem # 2:  HEARING LOSS, BILATERAL (ICD-389.9)  Patient complaining of left ear decreased hearing but both MEE and No Response on Sweep Audiometry in Right ear.  She also has no response in 500 hz and 2000 hz in left ear.  Some contribution to decrease in hearing from right ear MEE, though there may be some sensorineural  hearing loss preceding this acute decline. Recommended shortterm topical decongestant for now with follow-up of Ears, tympanograms and audiometry in 2 to 4 weeks. Patient has appointment for follow up 03/11/10 at [facility]. 500 hz: No Response 1000 hz: 25db 2000 hz: No Response 4000 hz: 25db Right  500 hz: No Response 1000 hz: No Response 2000 hz: No Response 4000 hz: No Response Hearing test: fail Orders: Neurology Referral (Neuro)  Orders: [facility]- Est  Level 4 (04540)  Problem # 3:  WEIGHT LOSS, ABNORMAL (ICD-783.21) Assessment: New 23 pounds unintentional weight loss documented in [facility] medical chart from 11/25/2009.  Poor caloric intake since recent URI illness per dgt.  Will bring patient in to evaluate this after acute issues around paresthetia and ataxia are more defined. Patient has appointment for follow up 03/11/10 at [facility].  Problem # 4:  HYPERTENSION (ICD-401.9) Uncontrolled currently, will reassess after stabilization of acute issues. Patient has appointment for follow up 03/11/10 at [facility]. Her updated medication list for this problem includes:    Metoprolol Succinate 100 Mg Xr24h-tab (Metoprolol succinate) .Marland Kitchen... Take one tablet by mouth daily    Amlodipine Besylate 10 Mg Tabs (Amlodipine besylate) .Marland Kitchen... 1 tab once daily  Complete Medication List: 1)  Metoprolol Succinate 100 Mg Xr24h-tab (Metoprolol succinate) .... Take one tablet by mouth daily 2)  Amlodipine Besylate 10 Mg Tabs (Amlodipine besylate) .Marland Kitchen.. 1 tab once daily 3)  Tynleol  .... Prn 4)  Ferrous Sulfate 325 (65 Fe) Mg Tabs (Ferrous sulfate) .... Take one tab by mouth two times a day with meals. 5)  Tramadol Hcl 50 Mg Tabs (Tramadol hcl) .Marland Kitchen.. 1 by mouth three times a day as needed pain   Vital Signs:  Patient Profile:   70 year old female Height:     63 inches (160.02 cm) Weight:      102 pounds BMI:     18.13 BSA:     1.45 Temp:     97.4 degrees F Pulse rate:   89 / minute BP sitting:   167 / 126                Audiometry Screening        Left  500 hz: No Response 1000 hz: 25db 2000 hz: No Response 4000 hz: 25db Right  500 hz: No Response 1000 hz: No Response 2000 hz: No Response 4000 hz: No Response Hearing test: fail   Hearing Testing Entered By: Garen Grams LPN (March 30, 2010 12:22 PM)

## 2010-08-22 NOTE — Cardiovascular Report (Signed)
Summary: Office Visit Remote   Office Visit Remote   Imported By: Roderic Ovens 05/04/2010 10:57:26  _____________________________________________________________________  External Attachment:    Type:   Image     Comment:   External Document

## 2010-08-22 NOTE — Progress Notes (Signed)
Summary: CALLING REGARDING PACEMAKER TRANSMIT  Phone Note Call from Patient Call back at Home Phone [phone]   Caller: Patient Summary of Call: PT CALLING REGARDING PACEMAKER. Initial call taken by: Judie Grieve,  October 03, 2009 10:57 AM  Follow-up for Phone Call        Pt will try to transmit again today. Gypsy Balsam RN BSN  October 03, 2009 11:16 AM

## 2010-08-22 NOTE — Progress Notes (Signed)
  Phone Note Call from Patient   Caller: Daughter Summary of Call: Daughter calling regarding appt needed for mom.  She is doing a lot of stumbling and appear to have difficulty hearing when spoken to in normal voice mode.  Please call back @ [phone]  Initial call taken by: Britta Mccreedy mcgregor  Follow-up for Phone Call        dtr states her mom lives with her sister. they report unsteady gait x 1 wk. they think it is her ears. says that this has happened before & it was her ears. she is also speaking loudy at times. work in at 55 with Dr. Tanja Gift Follow-up by: Golden Circle RN,  March 30, 2010 9:44 AM

## 2010-08-22 NOTE — Cardiovascular Report (Signed)
Summary: Office Visit Remote   Office Visit Remote   Imported By: Roderic Ovens 05/30/2010 15:32:47  _____________________________________________________________________  External Attachment:    Type:   Image     Comment:   External Document

## 2010-08-22 NOTE — Letter (Signed)
Summary: Remote Device Check  [employer], Main Office  [address]   [place], [place] [address]   Phone: [phone]  Fax: [phone]     May 03, 2010 MRN: [id number]   [name] [address] [place], [place]  [address]   Dear [name],   Your remote transmission was recieved and reviewed by your physician.  All diagnostics were within normal limits for you.  __X___Your next transmission is scheduled for:  05-11-2010.  Please transmit at any time this day.  If you have a wireless device your transmission will be sent automatically.   Sincerely,  Vella Kohler

## 2010-08-22 NOTE — Initial Assessments (Signed)
Summary: History and Physical   Visit Type:  hospital Admission Primary Provider:  . [facility]  CC:  ataxia.  History of Present Illness: Pt. is a 70 y/o aaf with 3 week history of weakness and ataxia that all started with a Viral upper respiratory infection she contracted from her grand daughter. Her daughter caught a similar viral infection and was out of work for a week and half recovering. The patient has lost 20 lbs over the last year with a loss of appetite that has been there for the last two years. She says ever since she had her Billroth procedure for gastric ulcers she has had a loss of appetite. the patient was at her neurologists office for labwork and on her way out of the building she felt faint and had a near-syncopal episode. Her family decided to bring her to the Emergency room. Pt. has no upper or lower GI symptoms. she has no SOB, no Chest Pain, no vaginal bleeding. She has no loss of vision or new loss of hearing. She has been afebrile and normotensive. Her complaints include tingling in her lower ext. and weakness making it difficult to walk. She has no vertigo.  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) 7)  Genital Pruritus  (ICD-698.1) 8)  Dermatitis  (ICD-692.9) 9)  Diverticulosis, Colon, Hx of  (ICD-V12.79) 10)  Palpitations, Hx of  (ICD-V12.50) 11)  Pacemaker, Permanent  (ICD-V45.01) 12)  Gastrointestinal Hemorrhage, Hx of  (ICD-V12.79) 13)  Peptic Ulcer Disease, Hx of  (ICD-V12.71) 14)  Atrial Fibrillation, Paroxysmal  (ICD-427.31) 15)  Hypertension  (ICD-401.9) 16)  Av Block, Complete  (ICD-426.0) 17)  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  (ICD-280.9)  Medications Prior to Update: 1)  Metoprolol Succinate 100 Mg Xr24h-Tab (Metoprolol Succinate) .... Take One Tablet By Mouth Daily 2)  Amlodipine Besylate 10 Mg  Tabs (Amlodipine Besylate) .Marland Kitchen.. 1 Tab Once Daily 3)  Tynleol .... Prn 4)  Ferrous Sulfate 325 (65 Fe) Mg Tabs (Ferrous Sulfate) .... Take One Tab By Mouth Two Times A Day With Meals. 5)  Tramadol Hcl 50 Mg Tabs (Tramadol Hcl) .Marland Kitchen.. 1 By Mouth Three Times A Day As Needed Pain  Allergies: 1)  ! Aspirin 2)  ! Caffeine Extra Strength 3)  ! Orphenadrine Citrate (Orphenadrine Citrate)  Past History:  Past Medical History: Last updated: 10/17/2009 DIVERTICULOSIS, COLON, HX OF (ICD-V12.79) - Diag 11/2005 PALPITATIONS, HX OF (ICD-V12.50) PACEMAKER, PERMANENT (ICD-V45.01) - placed in 2003 GASTROINTESTINAL HEMORRHAGE, HX OF (ICD-V12.79) - last one in Feb 2007 PEPTIC ULCER DISEASE, HX OF (ICD-V12.71) - in Feb 2007 ATRIAL FIBRILLATION, PAROXYSMAL (ICD-427.31) - not a coumadin candidate due to GI bleed HYPERTENSION (ICD-401.9) AV BLOCK, COMPLETE (ICD-426.0) - s/p pacemaker placement HYPERTROPHIC CARDIOMYOPATHY (ICD-425.1) - Last ECHO 01/05/09: EF 65 -70%, mod concentric LV hypertrophy, mod AR, severe  TR  1. Complete AV block. 2. Status post Medtronic dual chamber pacemaker implantation in 2003     with good pacer function, not pacer dependent. 3. Hypertrophic cardiomyopathy. Echo 09/14/09: Severe asymmetric hypertrophy with vigorous left ventricular      ejection fraction and no dynamic obstruction at rest (Valsalva  not performed), (wall motion normal), grade 1      diastolic dysfunction, 4. Hypertension. 5. Paroxysmal atrial fibrillation, not felt to be a Coumadin     candidate. 6. Peptic ulcer disease with a history of gastrointestinal bleed (most recent 10/10) requiring transfusion     Past Surgical History: Last updated: 10/17/2009 -antrectomy/vagotomy `95 -, Cardiac Pacemaker placed - 07/24/2002, ECHO 4/04:  severe focal basal septal thickening - 12/11/2002, -ECHO 4/98: LVH; nml sys fxn -, -ECHO 7/02; LV cavity near obliteration - 02/18/2001, echo3/23: Severe asymmetric hypertrophy with vigorous left ventricularejection fraction and no dynamic obstruction at rest (Valsalva not performed), (wall motion normal), grade 1 diastolic dysfunction, per echocardiogram, October 12, 2009. -EGD & transfusion 2 U 1/00 - -UGI bleed 10/10 -- Hgb 6.3 s/p red cell transfusion; endoscopy by Dr. Juanda Chance showed anastomotic ulcer at site of previous bilroth I procedure  Family History: Last updated: 2006-09-22 Father died of TIAs, COPD, Htn - daughter, Mother died of ?, Sister - Breast cancer in late 82s  Social History: Last updated: 03/30/2010 Lives with her daughter. Retired from working at  [employer] in July 2011. No smoking, alcohol, or drug use.  2 daughters Nettie Elm - lives with) and Marylene Land, 1 son.  Occasional sexual activity.  Divorced, but ex-husband died in 2022/07/29 of cancer.  Risk Factors: Alcohol Use: 0 (03/30/2010)  Risk Factors: Smoking Status: never (03/30/2010)   Family History: Reviewed history from 09-22-2006 and no changes required. Father died of TIAs, COPD, Htn - daughter, Mother died of ?, Sister - Breast cancer in late 83s  Social History: Reviewed history from 03/30/2010 and no changes required. Lives with her daughter. Retired from working at  [employer] in July 2011. No smoking, alcohol, or drug use.  2 daughters Nettie Elm - lives with) and Marylene Land, 1 son.  Occasional  sexual activity.  Divorced, but ex-husband died in 2022/07/29 of cancer.   Physical Exam  General:  alert, well-developed, and underweight appearing.   Head:  normocephalic and atraumatic.   Eyes:  vision grossly intact, pupils equal, pupils round, and pupils reactive to light.   Ears:  R ear normal and L ear normal.   Nose:  no external deformity.   Neck:  supple, full ROM, and no masses.   Chest Wall:  no deformities, no tenderness, and no mass.   Lungs:  Normal respiratory effort, chest expands symmetrically. Lungs are clear to auscultation, no crackles or wheezes. Heart:  Normal rate and regular rhythm. S1 and S2 normal without gallop, murmur, click, rub or other extra sounds. Abdomen:  soft, non-tender, normal bowel sounds, no distention, no masses, no rebound tenderness, and abdominal scar(s).   Msk:  normal ROM, no joint tenderness, and no joint swelling.   Neurologic:  alert & oriented X3, cranial nerves II-XII intact, strength normal in all extremities, sensation intact to light touch, DTRs symmetrical and normal, finger-to-nose normal, abnormal gait, and ataxic gait.   Skin:  turgor normal, color normal, and no rashes.   Cervical Nodes:  no anterior cervical adenopathy and no posterior cervical adenopathy.   Psych:  Oriented X3, memory intact for recent and remote, normally interactive,  good eye contact, not anxious appearing, and not depressed appearing.    LABS/Radiology:  CT HEAD: negative Cxray:  IMPRESSION:   Chronic cardiac pacer - no congestive heart failure or pneumonia in   one-view.  Drug Screen Negative U/A Negative  asa/acetominaphen - negative  Troponins: 0.05 INR 0.96   WBC                                      11.0       h      4.0-10.5         K/uL  RBC                                      4.55              3.87-5.11        MIL/uL  Hemoglobin (HGB)                         13.2              12.0-15.0        g/dL  Hematocrit (HCT)                         39.9               36.0-46.0        %  MCV                                      87.7              78.0-100.0       fL  MCH -                                    29.0              26.0-34.0        pg  MCHC                                     33.1              30.0-36.0        g/dL  RDW                                      13.9              11.5-15.5        %  Platelet Count (PLT)                     378               150-400          K/u  Pt. is a 70 y/o female with recent viral infection, ataxia and 20 lbs weight loss.  Impression & Recommendations:  Problem # 1:  ATAXIA (  ZOX-096.3) Assessment New Likely Post Viral coupled with poor oral intake for the last three weeks. Neurology on board. - They want to rule out Autoimmune vs. Paraneoplastic vs. Inflammatory cause of ataxia. Head CT negative for lesion or stroke. Neuro exam normal except weakness and gait abnormality. will get CT abd/pel, Chest. To rule out occult malignancy (weight loss/neurologic deficit) Neuro wants Lumbar Puncture.  Problem # 2:  WEIGHT LOSS, ABNORMAL (ICD-783.21) Assessment: Deteriorated See problem 1  Problem # 3:  PERIPHERAL NEUROPATHY (ICD-356.9) Assessment: Unchanged see problem 1  Problem # 4:  DIZZINESS (ICD-780.4) see problem 1  Problem # 5:  HYPERTROPHIC CARDIOMYOPATHY (ICD-425.1) repeat dizziness, will get Cardiology to evaluate Pacer again and weigh in on further need for cardio workup.  Problem # 6:  PACEMAKER, PERMANENT (ICD-V45.01) see problem 5  Problem # 7:  FEN-GI Hep subq Regular diet - plus Ensure  Problem # 8:  Dispo Will complete workup for occult cause of ataxia/weight loss. Likely DC in AM  Complete Medication List: 1)  Metoprolol Succinate 100 Mg Xr24h-tab (Metoprolol succinate) .... Take one tablet by mouth daily 2)  Amlodipine Besylate 10 Mg Tabs (Amlodipine besylate) .Marland Kitchen.. 1 tab once daily 3)  Tynleol  .... Prn 4)  Ferrous Sulfate 325 (65 Fe) Mg Tabs (Ferrous sulfate) .... Take  one tab by mouth two times a day with meals. 5)  Tramadol Hcl 50 Mg Tabs (Tramadol hcl) .Marland Kitchen.. 1 by mouth three times a day as needed pain  ]

## 2010-08-22 NOTE — Assessment & Plan Note (Signed)
Summary: wants knees injected//everhart   Vital Signs:  Patient profile:   70 year old female Height:      63 inches Weight:      125 pounds BMI:     22.22 BSA:     1.59 Temp:     98.3 degrees F Pulse rate:   77 / minute BP sitting:   148 / 95  Vitals Entered By: Jone Baseman CMA (Nov 25, 2009 9:53 AM) CC: bilateral knee pain/ wants knee injections Is Patient Diabetic? No Pain Assessment Patient in pain? yes     Location: bilateral knee pain Intensity: 10   Primary Care Provider:  Myrtie Soman  MD  CC:  bilateral knee pain/ wants knee injections.  History of Present Illness: bilateral knee pain - chronic.  no acute injury.  known arthritis.  has had good improvement for short periods of time with intraarticular injections to knees.  no complications in the past from injections.  last injection 06/14/09.   Habits & Providers  Alcohol-Tobacco-Diet     Tobacco Status: never  Current Medications (verified): 1)  Toprol Xl 100 Mg  Tb24 (Metoprolol Succinate) 2)  Amlodipine Besylate 10 Mg  Tabs (Amlodipine Besylate) 3)  Tynleol .... Prn 4)  Ferrous Sulfate 325 (65 Fe) Mg Tabs (Ferrous Sulfate) .... Take One Tab By Mouth Two Times A Day With Meals. 5)  Tramadol Hcl 50 Mg Tabs (Tramadol Hcl) .Marland Kitchen.. 1 By Mouth Three Times A Day As Needed Pain  Allergies (verified): 1)  ! Aspirin 2)  ! Caffeine Extra Strength 3)  ! Orphenadrine Citrate (Orphenadrine Citrate)  Past History:  PMH reviewed for relevance  Review of Systems       per HPI.  knees not swollen, warm or hot.  no recent fevers  Physical Exam  General:  vital signs reviewed Alert, appropriate; well-dressed. somewhat cachectic elderly female; pleasant.   Msk:  impressive arthritis of the knees bilatearlly with fat pat swelling on inferior aspects of both knees, prominent crepitus, and valgus deformity of knees.  Additional Exam:  procedure note: informed consent obtained site marked bilateral  knees with patient seated and legs dangling area prepped with betadyne and alcohol swab cold anesthetic applied for topical anesthesia from inferomedial approach 1cc kenalog 40 and 2 cc marcaine and 2cc 1% lidocaine injected pressure applied bandage placed patient tolerated well.  same procedure performed on both knees.  less than 5cc blood loss.      Impression & Recommendations:  Problem # 1:  KNEE PAIN, BILATERAL (ICD-719.46) Assessment Deteriorated injected without complication.  added tramadol for pain. cannot tolerate NSAIDS from GI standpoint.  Her updated medication list for this problem includes:    Tramadol Hcl 50 Mg Tabs (Tramadol hcl) .Marland Kitchen... 1 by mouth three times a day as needed pain  Orders: Injection, large joint- [facility] ([address]10) Injection, large joint- [facility] ([address])  Complete Medication List: 1)  Toprol Xl 100 Mg Tb24 (Metoprolol succinate) 2)  Amlodipine Besylate 10 Mg Tabs (Amlodipine besylate) 3)  Tynleol  .... Prn 4)  Ferrous Sulfate 325 (65 Fe) Mg Tabs (Ferrous sulfate) .... Take one tab by mouth two times a day with meals. 5)  Tramadol Hcl 50 Mg Tabs (Tramadol hcl) .Marland Kitchen.. 1 by mouth three times a day as needed pain  Patient Instructions: 1)  Ice the knees a couple of times today. 2)  Try the tramadol for pain (it works best if taken with tylenol) 3)  If you have other questions or  concerns please let us know. Prescriptions: TRAMADOL HCL 50 MG TABS (TRAMADOL HCL) 1 by mouth three times a day as needed pain  #90 x 1   Entered and Authorized by:   Ancil Boozer  MD   Signed by:   Ancil Boozer  MD on 11/25/2009   Method used:   Electronically to        [facility] (retail)       [address]       [place], [place]  [address]       Ph: [phone]       Fax: [phone]   RxID:   [id number]

## 2010-08-22 NOTE — Miscellaneous (Signed)
Summary: ECHO  Clinical Lists Changes  Observations: Added new observation of ECHOINTERP:  Study Conclusions    - Left ventricle: The cavity size was normal. There was severe     asymmetric hypertrophy. Systolic function was vigorous. The     estimated ejection fraction was in the range of 65% to 70%. There     was no dynamic obstruction at rest. Valsalva not performed. Wall     motion was normal; there were no regional wall motion     abnormalities. Doppler parameters are consistent with abnormal     left ventricular relaxation (grade 1 diastolic dysfunction).   - Aortic valve: Mild regurgitation.   - Mitral valve: There was mild systolic anterior motion. Mild     regurgitation directed posteriorly.   - Left atrium: The atrium was moderately dilated.   - Atrial septum: There was an atrial septal aneurysm. --------------------------------------------------------------------------------  Prepared and Electronically Authenticated by    Nicholes Mango, MD   2011-03-23T18:43:25.920  (10/12/2009 9:49)      Echocardiogram  Procedure date:  10/12/2009  Findings:       Study Conclusions    - Left ventricle: The cavity size was normal. There was severe     asymmetric hypertrophy. Systolic function was vigorous. The     estimated ejection fraction was in the range of 65% to 70%. There     was no dynamic obstruction at rest. Valsalva not performed. Wall     motion was normal; there were no regional wall motion     abnormalities. Doppler parameters are consistent with abnormal     left ventricular relaxation (grade 1 diastolic dysfunction).   - Aortic valve: Mild regurgitation.   - Mitral valve: There was mild systolic anterior motion. Mild     regurgitation directed posteriorly.   - Left atrium: The atrium was moderately dilated.   - Atrial septum: There was an atrial septal aneurysm. --------------------------------------------------------------------------------  Prepared and  Electronically Authenticated by    Nicholes Mango, MD   2011-03-23T18:43:25.920

## 2010-08-22 NOTE — Consult Note (Signed)
Summary: [facility]   [facility]   Imported By: Clydell Hakim 04/10/2010 16:54:38  _____________________________________________________________________  External Attachment:    Type:   Image     Comment:   External Document

## 2010-08-22 NOTE — Letter (Signed)
Summary: Remote Device Check  [facility], Main Office  [address]   [place], [place] [address]   Phone: [phone]  Fax: [phone]     May 26, 2010 MRN: [id number]   [name] [address]  [address] [place], [place]  [address]   Dear Ms. PELLE,   Your remote transmission was recieved and reviewed by your physician.  All diagnostics were within normal limits for you.  __X___Your next transmission is scheduled for:08/10/2010.   Please transmit at any time this day.  If you have a wireless device your transmission will be sent automatically.  ______Your next office visit is scheduled for:                              . Please call our office to schedule an appointment.    Sincerely,  Altha Harm, LPN

## 2010-08-22 NOTE — Progress Notes (Signed)
Summary: medication change  ---- Converted from flag ---- ---- 11/11/2009 10:41 PM, Lenoria Farrier, MD, [facility] wrote: Yes. BB  ---- 11/09/2009 6:34 PM, Sherri Rad, RN, BSN wrote: Do you want her to start iron or not? ------------------------------  Phone Note Outgoing Call   Call placed by: Sherri Rad, RN, BSN,  November 14, 2009 11:29 AM Call placed to: Patient Summary of Call: The pt is aware to start Iron 325mg  two times a day per Dr. Juanda Chance. Initial call taken by: Sherri Rad, RN, BSN,  November 14, 2009 11:30 AM

## 2010-08-22 NOTE — Cardiovascular Report (Signed)
Summary: Office Visit   Office Visit   Imported By: Roderic Ovens 11/09/2009 16:35:20  _____________________________________________________________________  External Attachment:    Type:   Image     Comment:   External Document

## 2010-08-22 NOTE — Consult Note (Signed)
Summary: Neurology Consultation on FMTS 04/05/10  NAME:  [name], [name]            ACCOUNT NO.:  [id number]      MEDICAL RECORD NO.:  [id number]          PATIENT TYPE:  EMS      LOCATION:  [facility]                         FACILITY:  [facility]      PHYSICIAN:  Felicie Morn, PA-C      DATE OF BIRTH:  [birth year]      DATE OF CONSULTATION:  04/05/2010   DATE OF DISCHARGE:                                    CONSULTATION      TIME:  3:00.      REASON FOR CONSULTATION:  New onset bilateral lower extremity numbness   and tingling with abnormal EG findings in the office.      HISTORY OF PRESENT ILLNESS:  This is a 70 year old right-handed African   American female with primary care physician, Dr. McDiarmid.  The patient   has a past medical history of hypertension, pacemaker placement for   bradycardia, low back surgery 30 years ago for back pain.  Approximately   3 weeks ago, the patient suffered a cold complaining of running nose,   coughing, but denied any fever.  The patient had not been feeling well   ever since that flu-like illness.  During that flu-like illness, she   also had a loss of appetite in which family members describe she was not   eating as well as she usually does at baseline.  A couple weeks ago she   noticed bilateral feet paresthesia that was mainly located on the   plantar surfaces of her feet.  A few days after the bilateral plantar   paresthesias, she noted some difficulty with her gait, complained of   generalized weakness, loss of balance, and tendency to list to the   right.  She also complained of bilateral feet achiness and dizziness   when standing up from a seated position.  She denied any vertiginous   sensations.  The patient denied any bilateral upper extremity weaknesses   or paresthesias.      In addition, over the past 3 weeks she has lost approximately 20 pounds   per family members, and 26 pounds over the past 9 months.  The patient   has had no problems  with bowel incontinence, bladder incontinence.      PAST MEDICAL HISTORY:   1. Hypertension.   2. Osteoarthritis.   3. Gastric ulcers.   4. Back surgery.   5. Pacemaker placement for bradycardia.      MEDICATIONS:   1. Tramadol 50 mg daily.   2. Metoprolol ER 100 mg daily.   3. Amlodipine 100 mg daily.      ALLERGIES:  No known drug allergies.      SOCIAL HISTORY:  The patient does not drink, smoke or do illicit drugs.      REVIEW OF SYSTEMS:  Positive for weight loss, fatigue, decreased   hearing, hypertension, swelling of feet and ankles, shortness of breath,   gastric ulcers, easily bruises, joint pain, muscle pain, difficulty with   walking and use of a cane, lower back pain,  difficulty with sleeping.      PHYSICAL EXAMINATION:  VITAL SIGNS:  Blood pressure is 114/84, pulse 61,   respiratory 16, temperature 96.2.   NEUROLOGIC:  The patient is alert and oriented x3.  She carries out two   and three-step commands without any difficulty.  Cranial nerves II-XII   are grossly intact.  Pupils are equal, round, reactive to light and   accommodating, conjugate gaze, extraocular movements are intact.  Visual   fields grossly intact.  Face is symmetrical.  Tongue is midline.  Uvula   is midline.  Facial sensation V1-T3 grossly intact.   COORDINATION:  Finger-to-nose, heel-to-shin are smooth.   GAIT:  The patient has a short, shuffled gait.  She tends to lean to the   right side and has a cautious unsteady gait.   MOTOR:  The patient has 5/5 strength throughout.   DEEP TENDON REFLEXES:  Grossly intact, 2+, downgoing toes bilaterally.   ROMBERG SIGN:  She is able to keep her balance with her feet apart at   shoulder width.  She does lean to the right side when her feet are   together and eyes are closed.   DRIFT:  The patient has no drift in the upper or lower extremities.   PULMONARY:  Clear to auscultation bilaterally.  No rhonchi or wheezing.   CARDIOVASCULAR:  Regular rate and  rhythm.  S1 and S2 audible.   NECK:  Negative for bruits and supple.   SENSATION:  The patient has full sensation to pinprick and light touch   throughout.  In her lower extremities, she has full proprioception,   vibratory sensation and sensation to pinprick, light touch.  I did note   some increase in sensation as she moved from her toe to about mid calf.      LABS:  CMP is pending.  CBC 11.0 for white blood cell count, platelets   378, hemoglobin 13.2, hematocrit 39.9.      IMAGING AND TESTS:  CT of head is negative.  Unfortunately, the patient   has a pacemaker and is unable to have an MRI.      EMG study that was done at the office on April 03, 2010, showed   electrodiagnostic evidence of demyelinating polyneuropathy as evident by   multiple prolonged distal latencies, F-wave latency, slow conduction   velocity, and temperature dispersion of left peroneal motor response.   There is no evidence of acute axonal loss; however, on examination there   are preserved deep tendon reflexes.      ASSESSMENT:  This is a pleasant 70 year old African American female with   new onset of bilateral lower extremity numbness, tingling and gait   difficulty.  The patient has also lost approximately 20 pounds over the   past 9 months of unknown origin.  At this time do not believe this is   GBS as the patient has fully intact deep tendon reflexes.  Possibility   of paraneoplastic is in the differential diagnosis at this time.      RECOMMENDATIONS:   1. At this time I will order an LP from Interventional Radiology to       look for protein, glucose, CSF, Gram stain cell and diff.  I will       also order a CBC, CMP, B12, TSH, CRP, sedimentation rate, ANA,       serum protein pheresis, EBV, hep C, hepatitis screen,       paraneoplastic panel, CT  of abdomen and pelvis with contrast.  I       would recommend starting the patient on an oral vitamin B complex       and starting the patient on a  prednisone Dosepak of 30 mg after the       patient receives her LP.        We will continue to follow this patient while she is in the hospital.   Thank you very much for this consultation.            Felicie Morn, PA-C         DS/MEDQ  D:  04/05/2010  T:  04/05/2010  Job:  [id number]      cc:   Levert Feinstein, MD

## 2010-08-22 NOTE — Assessment & Plan Note (Signed)
Summary: eph   Visit Type:  Follow-up Primary Provider:  Myrtie Soman  MD  CC:  arthritis.  History of Present Illness: The patient is 70 years old and return for followup management of her pacemaker and hypertrophic cardiomyopathy and paroxysmal H. fibrillation. She had a Medtronic DDD pacemaker implanted for AV block in 2003. She has a hypertrophic cardiopathy and her last echocardiogram in June 2000 and showed a septal thickness of 14 mm and the posterior wall thickness of 13 mm with no subvalvular obstruction. She also has paroxysmal atrial fibrillation but has not been felt to be a Coumadin candidate due to her history of previous GI bleeding.  She says she's been doing quite well over the past several months but no chest pain shortness of breath or palpitations.  Current Medications (verified): 1)  Toprol Xl 100 Mg  Tb24 (Metoprolol Succinate) 2)  Amlodipine Besylate 10 Mg  Tabs (Amlodipine Besylate) 3)  Tynleol .... Prn  Allergies (verified): 1)  ! Aspirin 2)  ! Caffeine Extra Strength 3)  ! Orphenadrine Citrate (Orphenadrine Citrate)   Past History:  Past Medical History: Reviewed history from 10/17/2009 and no changes required. 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 Complete AV block. 2. Status post Medtronic dual chamber pacemaker implantation in 2003     with good pacer function, not pacer dependent. 3. Hypertrophic cardiomyopathy. Echo 09/14/09:  Severe asymmetric hypertrophy with vigorous left ventricular      ejection fraction and no dynamic obstruction at rest (Valsalva not performed), (wall motion normal), grade 1      diastolic dysfunction, 4. Hypertension. 5. Paroxysmal atrial fibrillation, not felt to be a Coumadin     candidate. 6. Peptic ulcer disease with a history of gastrointestinal bleed (most recent 10/10) requiring transfusion     Review of Systems       ROS is negative except as outlined in HPI.   Vital Signs:  Patient profile:   70 year old female Height:      63 inches Weight:      125 pounds BMI:     22.22 Pulse rate:   60 / minute BP sitting:   140 / 80  (left arm)  Vitals Entered By: Burnett Kanaris, CNA (November 09, 2009 10:31 AM)  Physical Exam  Additional Exam:  Gen. Well-nourished, in no distress   Neck: No JVD, thyroid not enlarged, no carotid bruits Lungs: No tachypnea, clear without rales, rhonchi or wheezes Cardiovascular: Rhythm regular, PMI not displaced,  heart sounds  normal, no murmurs or gallops, no peripheral edema, pulses normal in all 4 extremities. Abdomen: BS normal, abdomen soft and non-tender without masses or organomegaly, no hepatosplenomegaly. MS: No deformities, no cyanosis or clubbing   Neuro:  No focal sns   Skin:  no lesions    PPM Specifications Following MD:  Everardo Beals. Juanda Chance, MD     PPM Vendor:  Medtronic     PPM Model Number:  [id number]     PPM Serial Number:  [id number] PPM DOI:  07/17/2002  Lead 1    Location: RA     DOI: 07/17/2002     Model #: [id number]     Serial #: [id number]     Status: active Lead 2    Location: RV     DOI: 07/18/2007     Model #: [id number]     Serial #: [id number]     Status: active   Indications:  2;1 AV BLOCK; PRE-SYNCOPE    PPM Follow Up Remote Check?  No Battery Voltage:  2.71 V     Battery Est. Longevity:  16 months     Pacer Dependent:  Yes       PPM Device Measurements Atrium  Amplitude: 2.0 mV, Impedance: 402 ohms, Threshold: 0.75 V at  0.4 msec Right Ventricle  Impedance: 648 ohms, Threshold: 0.75 V at 0.4 msec  Episodes MS Episodes:  4     Percent Mode Switch:  4.9%     Coumadin:  No Atrial Pacing:  57.7%     Ventricular Pacing:  100%  Parameters Mode:  DDD     Lower Rate Limit:  60     Upper Rate Limit:  120 Next Remote Date:  02/09/2010     Next Cardiology Appt Due:  10/22/2010 Tech Comments:  RV 2.5@0 .4.  Carelink transmissions every 3 months.  Longest mode switch 17:57 hours, - coumadin.  ROV 1 year  Impression & Recommendations:  Problem # 1:  PACEMAKER, PERMANENT (ICD-V45.01) She has a DDD pacemaker implanted for AV block in 2003. She is pacing both chambers and resting ECG. We will interrogate her pacemaker today.  Problem # 2:  HYPERTROPHIC CARDIOMYOPATHY (ICD-425.1)  She has a hypertrophic cardiomyopathy without obstruction. This polyp is stable at present. Her updated medication list for this problem includes:    Toprol Xl 100 Mg Tb24 (Metoprolol succinate)    Amlodipine Besylate 10 Mg Tabs (Amlodipine besylate)  Orders: EKG w/ Interpretation (93000) TLB-CBC Platelet - w/Differential (85025-CBCD) TLB-Ferritin (82728-FER) TLB-Iron, (Fe) Total (83540-FE) TLB-IBC Pnl (Iron/FE;Transferrin) (83550-IBC)  Problem # 3:  ATRIAL FIBRILLATION, PAROXYSMAL (ICD-427.31) She has paroxysmal mitral fibrillation and is not felt to be a Coumadin candidate. She is currently in sinus rhythm. We will continue current therapy. Her updated medication list for this problem includes:    Toprol Xl 100 Mg Tb24 (Metoprolol succinate)  Problem # 4:  GASTROINTESTINAL HEMORRHAGE, HX OF (ICD-V12.79) She has a history of GI bleed has been previously been treated with iron. We will get a CBC and iron studies today and decide about her starting on iron.  Patient Instructions: 1)  Your physician recommends that you have lab work today: cbc/iron/TIBC/ferritin (426.0;425.1) 2)  Your physician has recommended you make the following  change in your medication: 1) pending the results of you labwork, we will have you start Iron 325mg  two times a day. 3)  Your physician wants you to follow-up in: 1 year with Dr. Johney Frame.  You will receive a reminder letter in the mail two months in advance. If you don't receive a letter, please call our office to schedule the follow-up appointment. Prescriptions: FERROUS SULFATE 325 (65 FE) MG TABS (FERROUS SULFATE) take one tab by mouth two times a day with meals.  #60 x 6   Entered by:   Sherri Rad, RN, BSN   Authorized by:   Lenoria Farrier, MD, [facility]   Signed by:   Sherri Rad, RN, BSN on 11/09/2009   Method used:   Print then Give to Patient  RxID:   [id number]

## 2010-08-22 NOTE — Assessment & Plan Note (Signed)
Summary: leg pain,tcb   Vital Signs:  Patient profile:   70 year old female Height:      63 inches Weight:      103 pounds BMI:     18.31 BSA:     1.46 Temp:     98.1 degrees F Pulse rate:   100 / minute BP sitting:   170 / 100  Vitals Entered By: Jone Baseman CMA (March 28, 2010 11:31 AM) CC: Bilateral leg pain, tingling x 1 week Is Patient Diabetic? No Pain Assessment Patient in pain? yes     Location: lower half of body Intensity: 10   Primary Care Provider:  . [facility]  CC:  Bilateral leg pain and tingling x 1 week.  History of Present Illness: 1) Bilateral foot tingling / pain: x 4-5 days. Never had this before. Started acutely, gradually worsened. Pain and tingling start at dorsal surface of feet and goes up to mid calf bilaterally. Equal symptom intensity bilaterally. Reports that she has had a cold for the past 2-3 weeks and has mostly been in bed, getting up to use bathroom, eat meals and walk around occasionally. 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   Denies rash, fever, nausea, emesis, history of diabetes, EtOH abuse, weakness, rash, chest pain, dyspnea, LE edema, current back pain, trauma, bruising, numbness/tingling elsewhere, difficulty with urination or defacation.   Habits & Providers  Alcohol-Tobacco-Diet     Tobacco Status: never  Current Medications (verified): 1)  Metoprolol Succinate 100 Mg Xr24h-Tab (Metoprolol Succinate) .... Take One Tablet By Mouth Daily 2)  Amlodipine Besylate 10 Mg  Tabs (Amlodipine Besylate) .Marland Kitchen.. 1 Tab Once Daily 3)  Tynleol .... Prn 4)  Ferrous Sulfate 325 (65 Fe) Mg Tabs (Ferrous Sulfate) .... Take One Tab By Mouth Two Times A Day With Meals. 5)  Tramadol Hcl 50 Mg Tabs (Tramadol Hcl) .Marland Kitchen.. 1 By Mouth Three Times A Day As Needed Pain  Allergies (verified): 1)  ! Aspirin 2)  ! Caffeine  Extra Strength 3)  ! Orphenadrine Citrate (Orphenadrine Citrate)  Physical Exam  General:  thin, alert,  well-appearing elderly female in NAD, cooperative to exam Eyes:  pupils equal, pupils round, corneas and lenses clear, and no injection.   Mouth:  moist membranes  Neck:  No deformities, masses, or tenderness noted. Lungs:  Normal respiratory effort, chest expands symmetrically. Lungs are clear to auscultation, no crackles or wheezes. Heart:  Normal rate and regular rhythm. 2/6 systolic murmur RUSB  Abdomen:  Bowel sounds positive,abdomen soft and non-tender without masses, organomegaly or hernias noted. Msk:  normal ROM, no joint tenderness, no joint swelling, no joint warmth, no redness over joints, and no joint deformities.   - negative SLR bilaterally - negative FABER bilaterally  Pulses:  2+ pedals bilaterally, well perfused  Extremities:  no edema  Neurologic:  alert & oriented X3, cranial nerves II-XII intact, strength 4+/5 in all extremities , and sensation intact to light touch.   DTRs 1+ patellae  Skin:  no skin changes bilateral lower extremities    Impression & Recommendations:  Problem # 1:  PERIPHERAL NEUROPATHY (ICD-356.9) Assessment New Concern for peripheral neuropathy of unknown etiology. Will start workup as below for metabolic (check BMET) vs. infectious vs inflammatory vs nutritional deficiency as cause. No history of diabetes. Red flags that would prompt return to care reviewed with  patient. Follow up in 1-2 weeks. Tramadol for pain. Uncertain if related to deconditioning.   Orders: B12-[facility] ([id number]) RPR-[facility] [phone]) Sed Rate (ESR)-[facility] [phone]) [facility]- Est Level  3 (30160)  Complete Medication List: 1)  Metoprolol Succinate 100 Mg Xr24h-tab (Metoprolol succinate) .... Take one tablet by mouth daily 2)  Amlodipine Besylate 10 Mg Tabs (Amlodipine besylate) .Marland Kitchen.. 1 tab once daily 3)  Tynleol  .... Prn 4)  Ferrous Sulfate 325 (65 Fe) Mg Tabs (Ferrous  sulfate) .... Take one tab by mouth two times a day with meals. 5)  Tramadol Hcl 50 Mg Tabs (Tramadol hcl) .Marland Kitchen.. 1 by mouth three times a day as needed pain  Other Orders: Basic Met-[facility] [phone])  Laboratory Results   Blood Tests   Date/Time Received: March 28, 2010 11:57 AM  Date/Time Reported: March 28, 2010 3:01 PM   SED rate: 34 mm/hr  Comments: ...........test performed by...........Marland KitchenTerese Door, CMA

## 2010-08-22 NOTE — Progress Notes (Signed)
Summary: triage  Phone Note Call from Patient Call back at Home Phone [phone]   Caller: Patient Summary of Call: Pt asking if she can get a cortisone shot for today. Initial call taken by: Clydell Hakim,  Nov 24, 2009 10:19 AM  Follow-up for Phone Call        c/o pain in both knees. has had pain for months. has been seen for this and has had injections before. work in with Dr. Sandi Mealy at 9:45. pcp is on vacation Follow-up by: Golden Circle RN,  Nov 24, 2009 10:23 AM

## 2010-08-22 NOTE — Miscellaneous (Signed)
Summary: Consent Knee Injection  Consent Knee Injection   Imported By: Clydell Hakim 11/30/2009 11:16:52  _____________________________________________________________________  External Attachment:    Type:   Image     Comment:   External Document

## 2010-08-22 NOTE — Cardiovascular Report (Signed)
Summary: Office Visit Remote   Office Visit Remote   Imported By: Roderic Ovens 10/20/2009 11:57:15  _____________________________________________________________________  External Attachment:    Type:   Image     Comment:   External Document

## 2010-08-30 ENCOUNTER — Encounter (INDEPENDENT_AMBULATORY_CARE_PROVIDER_SITE_OTHER): Payer: Self-pay | Admitting: *Deleted

## 2010-09-07 NOTE — Letter (Signed)
Summary: Remote Device Check  [employer], Main Office  [address]   [place], [place] [address]   Phone: [phone]  Fax: [phone]     August 30, 2010 MRN: [id number]   GENNAVIEVE HUQ [address]  [address] [place], [place]  [address]   Dear Ms. STEEBER,   Your remote transmission was recieved and reviewed by your physician.  All diagnostics were within normal limits for you.  __X___Your next transmission is scheduled for: 09-14-2010 .  Please transmit at any time this day.  If you have a wireless device your transmission will be sent automatically.   Sincerely,  Vella Kohler

## 2010-09-07 NOTE — Cardiovascular Report (Signed)
Summary: Office Visit Remote   Office Visit Remote   Imported By: Roderic Ovens 08/31/2010 15:31:16  _____________________________________________________________________  External Attachment:    Type:   Image     Comment:   External Document

## 2010-09-19 ENCOUNTER — Encounter (INDEPENDENT_AMBULATORY_CARE_PROVIDER_SITE_OTHER): Payer: Self-pay | Admitting: *Deleted

## 2010-09-28 NOTE — Letter (Signed)
Summary: Device-Delinquent Phone Journalist, newspaper, Main Office  [address]   [place], [place] [address]   Phone: [phone]  Fax: [phone]     September 19, 2010 MRN: [id number]   INDIGO BARBIAN [address]  [address] [place], [place]  [address]   Dear Ms. HICKOK,  According to our records, you were scheduled for a device phone transmission on 09-14-10.     We did not receive any results from this check.  If you transmitted on your scheduled day, please call us to help troubleshoot your system.  If you forgot to send your transmission, please send one upon receipt of this letter.  Thank you,   Architectural technologist [facility]

## 2010-10-05 ENCOUNTER — Encounter (INDEPENDENT_AMBULATORY_CARE_PROVIDER_SITE_OTHER): Payer: Self-pay | Admitting: *Deleted

## 2010-10-05 LAB — URINALYSIS, ROUTINE W REFLEX MICROSCOPIC
Glucose, UA: NEGATIVE mg/dL
Ketones, ur: NEGATIVE mg/dL
Nitrite: NEGATIVE
Specific Gravity, Urine: 1.009 (ref 1.005–1.030)
pH: 5.5 (ref 5.0–8.0)

## 2010-10-05 LAB — BASIC METABOLIC PANEL
BUN: 5 mg/dL — ABNORMAL LOW (ref 6–23)
BUN: 6 mg/dL (ref 6–23)
CO2: 17 mEq/L — ABNORMAL LOW (ref 19–32)
CO2: 24 mEq/L (ref 19–32)
CO2: 26 mEq/L (ref 19–32)
Calcium: 9 mg/dL (ref 8.4–10.5)
Calcium: 9.3 mg/dL (ref 8.4–10.5)
Chloride: 112 mEq/L (ref 96–112)
Chloride: 112 mEq/L (ref 96–112)
Creatinine, Ser: 0.6 mg/dL (ref 0.4–1.2)
Creatinine, Ser: 0.62 mg/dL (ref 0.4–1.2)
Creatinine, Ser: 0.72 mg/dL (ref 0.4–1.2)
Creatinine, Ser: 0.75 mg/dL (ref 0.4–1.2)
Creatinine, Ser: 0.8 mg/dL (ref 0.4–1.2)
GFR calc Af Amer: >60 mL/min (ref >60)
GFR calc Af Amer: >60 mL/min (ref >60)
GFR calc Af Amer: >60 mL/min (ref >60)
GFR calc non Af Amer: >60 mL/min (ref >60)
GFR calc non Af Amer: >60 mL/min (ref >60)
GFR calc non Af Amer: >60 mL/min (ref >60)
Glucose, Bld: 85 mg/dL (ref 70–99)
Glucose, Bld: 93 mg/dL (ref 70–99)
Glucose, Bld: 95 mg/dL (ref 70–99)
Potassium: 3.4 mEq/L — ABNORMAL LOW (ref 3.5–5.1)
Potassium: 5.1 mEq/L (ref 3.5–5.1)
Sodium: 139 mEq/L (ref 135–145)
Sodium: 141 mEq/L (ref 135–145)

## 2010-10-05 LAB — DIFFERENTIAL
Basophils Absolute: 0 10*3/uL (ref 0.0–0.1)
Eosinophils Absolute: 0 10*3/uL (ref 0.0–0.7)
Eosinophils Relative: 2 % (ref 0–5)
Eosinophils Relative: 0 % (ref 0–5)
Lymphocytes Relative: 11 % — ABNORMAL LOW (ref 12–46)
Lymphocytes Relative: 8 % — ABNORMAL LOW (ref 12–46)
Lymphs Abs: 0.8 10*3/uL (ref 0.7–4.0)
Lymphs Abs: 1.3 10*3/uL (ref 0.7–4.0)
Monocytes Absolute: 0.6 10*3/uL (ref 0.1–1.0)
Monocytes Absolute: 0.7 10*3/uL (ref 0.1–1.0)
Monocytes Relative: 9 % (ref 3–12)
Monocytes Relative: 10 % (ref 3–12)
Neutro Abs: 5.1 10*3/uL (ref 1.7–7.7)
Neutrophils Relative %: 70 % (ref 43–77)

## 2010-10-05 LAB — CBC
HCT: 30.8 % — ABNORMAL LOW (ref 36.0–46.0)
HCT: 33.5 % — ABNORMAL LOW (ref 36.0–46.0)
HCT: 33.9 % — ABNORMAL LOW (ref 36.0–46.0)
HCT: 34.4 % — ABNORMAL LOW (ref 36.0–46.0)
HCT: 37.6 % (ref 36.0–46.0)
HCT: 35.3 % — ABNORMAL LOW (ref 36.0–46.0)
HCT: 39.9 % (ref 36.0–46.0)
Hemoglobin: 10 g/dL — ABNORMAL LOW (ref 12.0–15.0)
Hemoglobin: 10.4 g/dL — ABNORMAL LOW (ref 12.0–15.0)
Hemoglobin: 11.4 g/dL — ABNORMAL LOW (ref 12.0–15.0)
Hemoglobin: 9.9 g/dL — ABNORMAL LOW (ref 12.0–15.0)
MCH: 28.9 pg (ref 26.0–34.0)
MCH: 29.2 pg (ref 26.0–34.0)
MCH: 29.2 pg (ref 26.0–34.0)
MCH: 29.4 pg (ref 26.0–34.0)
MCH: 29 pg (ref 26.0–34.0)
MCH: 29 pg (ref 26.0–34.0)
MCHC: 32.7 g/dL (ref 30.0–36.0)
MCHC: 33 g/dL (ref 30.0–36.0)
MCHC: 33.1 g/dL (ref 30.0–36.0)
MCHC: 33.8 g/dL (ref 30.0–36.0)
MCHC: 33.1 g/dL (ref 30.0–36.0)
MCV: 85.7 fL (ref 78.0–100.0)
MCV: 86.7 fL (ref 78.0–100.0)
MCV: 87.8 fL (ref 78.0–100.0)
MCV: 87.4 fL (ref 78.0–100.0)
MCV: 87.7 fL (ref 78.0–100.0)
Platelets: 213 10*3/uL (ref 150–400)
Platelets: 228 10*3/uL (ref 150–400)
Platelets: 246 10*3/uL (ref 150–400)
Platelets: 271 10*3/uL (ref 150–400)
Platelets: 285 10*3/uL (ref 150–400)
RBC: 3.44 MIL/uL — ABNORMAL LOW (ref 3.87–5.11)
RBC: 3.91 MIL/uL (ref 3.87–5.11)
RBC: 4.55 MIL/uL (ref 3.87–5.11)
RDW: 13.7 % (ref 11.5–15.5)
RDW: 14 % (ref 11.5–15.5)
RDW: 14.2 % (ref 11.5–15.5)
RDW: 14.8 % (ref 11.5–15.5)
RDW: 13.6 % (ref 11.5–15.5)
RDW: 13.9 % (ref 11.5–15.5)
WBC: 7.4 10*3/uL (ref 4.0–10.5)
WBC: 7.6 10*3/uL (ref 4.0–10.5)
WBC: 11 10*3/uL — ABNORMAL HIGH (ref 4.0–10.5)

## 2010-10-05 LAB — GLUCOSE, CAPILLARY: Glucose-Capillary: 104 mg/dL — ABNORMAL HIGH (ref 70–99)

## 2010-10-05 LAB — COMPREHENSIVE METABOLIC PANEL
ALT: 16 U/L (ref 0–35)
AST: 43 U/L — ABNORMAL HIGH (ref 0–37)
AST: 32 U/L (ref 0–37)
Albumin: 3.3 g/dL — ABNORMAL LOW (ref 3.5–5.2)
CO2: 23 mEq/L (ref 19–32)
Calcium: 9.2 mg/dL (ref 8.4–10.5)
Creatinine, Ser: 0.74 mg/dL (ref 0.4–1.2)
GFR calc Af Amer: >60 mL/min (ref >60)
GFR calc non Af Amer: >60 mL/min (ref >60)
GFR calc non Af Amer: >60 mL/min (ref >60)
Total Bilirubin: 0.7 mg/dL (ref 0.3–1.2)

## 2010-10-05 LAB — URINE MICROSCOPIC-ADD ON

## 2010-10-05 LAB — CSF CELL COUNT WITH DIFFERENTIAL
RBC Count, CSF: 14 /mm3 — ABNORMAL HIGH
WBC, CSF: 13 /mm3 (ref 0–5)

## 2010-10-05 LAB — RAPID URINE DRUG SCREEN, HOSP PERFORMED
Barbiturates: NOT DETECTED
Benzodiazepines: NOT DETECTED
Opiates: NOT DETECTED

## 2010-10-05 LAB — HEPATITIS C ANTIBODY: HCV Ab: NEGATIVE

## 2010-10-05 LAB — CARDIAC PANEL(CRET KIN+CKTOT+MB+TROPI)
CK, MB: 2.1 ng/mL (ref 0.3–4.0)
Relative Index: 1.6 (ref 0.0–2.5)
Troponin I: 0.01 ng/mL (ref 0.00–0.06)

## 2010-10-05 LAB — PROTEIN ELECTROPHORESIS, SERUM
Beta 2: 4.4 % (ref 3.2–6.5)
Beta Globulin: 5.8 % (ref 4.7–7.2)
M-Spike, %: NOT DETECTED g/dL
Total Protein ELP: 7 g/dL (ref 6.0–8.3)

## 2010-10-05 LAB — SEDIMENTATION RATE: Sed Rate: 10 mm/hr (ref 0–22)

## 2010-10-05 LAB — B. BURGDORFI ANTIBODIES BY WB: B burgdorferi IgM Abs (IB): NEGATIVE

## 2010-10-05 LAB — PROTEIN AND GLUCOSE, CSF: Total  Protein, CSF: 137 mg/dL — ABNORMAL HIGH (ref 15–45)

## 2010-10-05 LAB — SJOGRENS SYNDROME-B EXTRACTABLE NUCLEAR ANTIBODY: SSB (La) (ENA) Antibody, IgG: <1 AU/mL (ref <30)

## 2010-10-05 LAB — URINE CULTURE: Culture  Setup Time: 201109141627

## 2010-10-05 LAB — FIBRINOGEN: Fibrinogen: 347 mg/dL (ref 204–475)

## 2010-10-05 LAB — RPR: RPR Ser Ql: NONREACTIVE

## 2010-10-05 LAB — VITAMIN B12: Vitamin B-12: 440 pg/mL (ref 211–911)

## 2010-10-05 LAB — POCT CARDIAC MARKERS: Myoglobin, poc: 144 ng/mL (ref 12–200)

## 2010-10-10 NOTE — Letter (Signed)
Summary: Appointment - Reschedule  [employer], Main Office  [address]   [place], [place] [address]   Phone: [phone]  Fax: [phone]     October 05, 2010 MRN: [id number]   [name] [address]  [address] [place], [place]  [address]   Dear Ms. MIRKIN,   Due to a change in our office schedule, your appointment on  10-23-10  at  2:45p              must be changed.  It is very important that we reach you to reschedule this appointment. We look forward to participating in your health care needs. Please contact us at the number listed above at your earliest convenience to reschedule this appointment.     Sincerely,  Glass blower/designer

## 2010-10-11 ENCOUNTER — Encounter: Payer: Self-pay | Admitting: Internal Medicine

## 2010-10-13 LAB — DIFFERENTIAL
Eosinophils Absolute: 0.1 10*3/uL (ref 0.0–0.7)
Lymphocytes Relative: 10 % — ABNORMAL LOW (ref 12–46)
Lymphs Abs: 0.7 10*3/uL (ref 0.7–4.0)
Neutrophils Relative %: 82 % — ABNORMAL HIGH (ref 43–77)

## 2010-10-13 LAB — TSH: TSH: 0.367 u[IU]/mL (ref 0.350–4.500)

## 2010-10-13 LAB — COMPREHENSIVE METABOLIC PANEL
ALT: 19 U/L (ref 0–35)
AST: 28 U/L (ref 0–37)
Albumin: 3.7 g/dL (ref 3.5–5.2)
Alkaline Phosphatase: 76 U/L (ref 39–117)
BUN: 11 mg/dL (ref 6–23)
Chloride: 108 mEq/L (ref 96–112)
GFR calc Af Amer: >60 mL/min (ref >60)
Potassium: 3.7 mEq/L (ref 3.5–5.1)
Sodium: 139 mEq/L (ref 135–145)
Total Bilirubin: 1 mg/dL (ref 0.3–1.2)

## 2010-10-13 LAB — CARDIAC PANEL(CRET KIN+CKTOT+MB+TROPI)
Relative Index: 1.7 (ref 0.0–2.5)
Relative Index: 1.7 (ref 0.0–2.5)
Troponin I: 0.01 ng/mL (ref 0.00–0.06)

## 2010-10-13 LAB — CBC
MCV: 91.2 fL (ref 78.0–100.0)
Platelets: 287 10*3/uL (ref 150–400)
WBC: 7.1 10*3/uL (ref 4.0–10.5)

## 2010-10-13 LAB — POCT CARDIAC MARKERS: Myoglobin, poc: 94.5 ng/mL (ref 12–200)

## 2010-10-13 LAB — BASIC METABOLIC PANEL
BUN: 16 mg/dL (ref 6–23)
Creatinine, Ser: 1 mg/dL (ref 0.4–1.2)
GFR calc non Af Amer: 55 mL/min — ABNORMAL LOW (ref >60)

## 2010-10-13 LAB — TROPONIN I: Troponin I: 0.01 ng/mL (ref 0.00–0.06)

## 2010-10-13 LAB — PROTIME-INR: INR: 1.13 (ref 0.00–1.49)

## 2010-10-13 LAB — CK TOTAL AND CKMB (NOT AT ARMC): Total CK: 144 U/L (ref 7–177)

## 2010-10-23 ENCOUNTER — Ambulatory Visit: Payer: Self-pay | Admitting: Internal Medicine

## 2010-10-26 LAB — CBC
HCT: 19 % — ABNORMAL LOW (ref 36.0–46.0)
HCT: 23.1 % — ABNORMAL LOW (ref 36.0–46.0)
HCT: 31.7 % — ABNORMAL LOW (ref 36.0–46.0)
HCT: 33.5 % — ABNORMAL LOW (ref 36.0–46.0)
Hemoglobin: 10.7 g/dL — ABNORMAL LOW (ref 12.0–15.0)
Hemoglobin: 10.9 g/dL — ABNORMAL LOW (ref 12.0–15.0)
Hemoglobin: 11.6 g/dL — ABNORMAL LOW (ref 12.0–15.0)
Hemoglobin: 7.9 g/dL — CL (ref 12.0–15.0)
MCHC: 33.3 g/dL (ref 30.0–36.0)
MCHC: 33.4 g/dL (ref 30.0–36.0)
MCHC: 34.5 g/dL (ref 30.0–36.0)
MCHC: 34.8 g/dL (ref 30.0–36.0)
MCV: 85.6 fL (ref 78.0–100.0)
MCV: 86.1 fL (ref 78.0–100.0)
MCV: 86.9 fL (ref 78.0–100.0)
Platelets: 187 10*3/uL (ref 150–400)
Platelets: 187 10*3/uL (ref 150–400)
Platelets: 190 10*3/uL (ref 150–400)
Platelets: 222 10*3/uL (ref 150–400)
RBC: 2.7 MIL/uL — ABNORMAL LOW (ref 3.87–5.11)
RBC: 3.69 MIL/uL — ABNORMAL LOW (ref 3.87–5.11)
RBC: 3.86 MIL/uL — ABNORMAL LOW (ref 3.87–5.11)
RDW: 14.3 % (ref 11.5–15.5)
RDW: 14.4 % (ref 11.5–15.5)
RDW: 14.7 % (ref 11.5–15.5)
RDW: 14.9 % (ref 11.5–15.5)
WBC: 10.8 10*3/uL — ABNORMAL HIGH (ref 4.0–10.5)
WBC: 9.1 10*3/uL (ref 4.0–10.5)

## 2010-10-26 LAB — COMPREHENSIVE METABOLIC PANEL
AST: 17 U/L (ref 0–37)
Albumin: 2.8 g/dL — ABNORMAL LOW (ref 3.5–5.2)
BUN: 43 mg/dL — ABNORMAL HIGH (ref 6–23)
Calcium: 8.2 mg/dL — ABNORMAL LOW (ref 8.4–10.5)
Creatinine, Ser: 0.93 mg/dL (ref 0.4–1.2)
GFR calc Af Amer: >60 mL/min (ref >60)
GFR calc non Af Amer: 60 mL/min — ABNORMAL LOW (ref >60)

## 2010-10-26 LAB — CROSSMATCH
ABO/RH(D): O POS
Donor AG Type: NEGATIVE
Donor AG Type: NEGATIVE

## 2010-10-26 LAB — POCT CARDIAC MARKERS
CKMB, poc: <1 ng/mL — ABNORMAL LOW (ref 1.0–8.0)
Troponin i, poc: <0.05 ng/mL (ref 0.00–0.09)

## 2010-10-26 LAB — BASIC METABOLIC PANEL
BUN: 38 mg/dL — ABNORMAL HIGH (ref 6–23)
CO2: 23 mEq/L (ref 19–32)
Calcium: 8 mg/dL — ABNORMAL LOW (ref 8.4–10.5)
Creatinine, Ser: 0.9 mg/dL (ref 0.4–1.2)
GFR calc non Af Amer: >60 mL/min (ref >60)
Glucose, Bld: 98 mg/dL (ref 70–99)

## 2010-10-26 LAB — DIFFERENTIAL
Basophils Absolute: 0 10*3/uL (ref 0.0–0.1)
Eosinophils Relative: 0 % (ref 0–5)
Lymphocytes Relative: 8 % — ABNORMAL LOW (ref 12–46)
Lymphs Abs: 0.9 10*3/uL (ref 0.7–4.0)
Monocytes Absolute: 0.6 10*3/uL (ref 0.1–1.0)
Neutro Abs: 9.5 10*3/uL — ABNORMAL HIGH (ref 1.7–7.7)

## 2010-10-26 LAB — PROTIME-INR
INR: 1.16 (ref 0.00–1.49)
Prothrombin Time: 14.7 s (ref 11.6–15.2)

## 2010-10-26 LAB — APTT: aPTT: 25 seconds (ref 24–37)

## 2010-10-26 LAB — PREPARE RBC (CROSSMATCH)

## 2010-11-15 ENCOUNTER — Encounter: Payer: Self-pay | Admitting: Internal Medicine

## 2010-11-15 ENCOUNTER — Encounter: Payer: Self-pay | Admitting: *Deleted

## 2010-11-15 ENCOUNTER — Ambulatory Visit (INDEPENDENT_AMBULATORY_CARE_PROVIDER_SITE_OTHER): Payer: Medicare Other | Admitting: Internal Medicine

## 2010-11-15 DIAGNOSIS — I442 Atrioventricular block, complete: Secondary | ICD-10-CM

## 2010-11-15 DIAGNOSIS — I4891 Unspecified atrial fibrillation: Secondary | ICD-10-CM

## 2010-11-15 LAB — CBC WITH DIFFERENTIAL/PLATELET
Eosinophils Absolute: 0.1 10*3/uL (ref 0.0–0.7)
MCHC: 33 g/dL (ref 30.0–36.0)
MCV: 86 fl (ref 78.0–100.0)
Monocytes Absolute: 0.7 10*3/uL (ref 0.1–1.0)
Neutrophils Relative %: 77 % (ref 43.0–77.0)
Platelets: 295 10*3/uL (ref 150.0–400.0)
WBC: 7.5 10*3/uL (ref 4.5–10.5)

## 2010-11-15 LAB — BASIC METABOLIC PANEL
GFR: 73.42 mL/min (ref >60.00)
Potassium: 3.3 mEq/L — ABNORMAL LOW (ref 3.5–5.1)
Sodium: 143 mEq/L (ref 135–145)

## 2010-11-15 LAB — APTT: aPTT: 27 s (ref 21.7–28.8)

## 2010-11-15 LAB — PROTIME-INR: Prothrombin Time: 13 s — ABNORMAL HIGH (ref 10.2–12.4)

## 2010-11-15 NOTE — Assessment & Plan Note (Signed)
Asymptomatic Not felt to be a coumadin or ASA candidate due to GI bleeding

## 2010-11-15 NOTE — Patient Instructions (Signed)
Lab work today. We will call you with any abnormal labs. Your physician has recommended that you have  New battery  pacemaker inserted. A pacemaker is a small device that is placed under the skin of your chest or abdomen to help control abnormal heart rhythms. This device uses electrical pulses to prompt the heart to beat at a normal rate. Pacemakers are used to treat heart rhythms that are too slow. Wire (leads) are attached to the pacemaker that goes into the chambers of you heart. This is done in the hospital and usually requires and overnight stay. Please see the instruction sheet given to you today for more information.

## 2010-11-15 NOTE — Assessment & Plan Note (Signed)
Stable No changes 

## 2010-11-15 NOTE — Progress Notes (Signed)
[name] is a pleasant 70 y.o. yo patient with a h/o bradycardia sp PPM (MDT) by Dr Juanda Chance who presents today to establish care in the Electrophysiology device clinic.  The patient reports doing very well since having a pacemaker implanted and remains very active despite her age.   Today, she  denies symptoms of palpitations, chest pain, shortness of breath, orthopnea, PND, lower extremity edema, dizziness, presyncope, syncope, or neurologic sequela.  The patientis tolerating medications without difficulties and is otherwise without complaint today.  Her primary concern is with DJD of the knees.  Past Medical History  Diagnosis Date  . History of Guillain-Barre syndrome     9/11  . Diverticulosis 11/2005    colon, history  . Gastrointestinal hemorrhage 08/2005    Not felt to be a coumadin or ASA candidate  . Peptic ulcer disease 08/2005  . Paroxysmal atrial fibrillation     prior GI bleeding- not felt to be a candidate for ASA or coumadin  . Hypertension   . Atrioventricular block, complete     s/p PPM  . Hypertrophic obstructive cardiomyopathy   . Diastolic dysfunction 10/12/2009  . UGI bleed 04/2009  . Blood transfusion abn reaction or complication, no procedure mishap     Past Surgical History  Procedure Date  . Vagotomy 1995  . Cardiac pacemaker placement 2003/2004    medtronic  . Fetal blood transfusion     History   Social History  . Marital Status: Single    Spouse Name: N/A    Number of Children: N/A  . Years of Education: N/A   Occupational History  . Not on file.   Social History Main Topics  . Smoking status: Never Smoker   . Smokeless tobacco: Not on file  . Alcohol Use: No  . Drug Use: No  . Sexually Active: Not on file   Other Topics Concern  . Not on file   Social History Narrative  . No narrative on file    Family History  Problem Relation Age of Onset  . Transient ischemic attack Father   . COPD Father   . Hypertension Daughter   .  Breast cancer Sister     Allergies  Allergen Reactions  . Aspirin     REACTION: doesn't take due to trouble w/ ulcers  . Orphenadrine Citrate     Current Outpatient Prescriptions  Medication Sig Dispense Refill  . amLODipine (NORVASC) 10 MG tablet Take 10 mg by mouth daily.        . metoprolol (TOPROL-XL) 100 MG 24 hr tablet Take 100 mg by mouth daily.        . Multiple Vitamin (MULTIVITAMIN) capsule Take 1 capsule by mouth daily.        Marland Kitchen DISCONTD: traMADol (ULTRAM) 50 MG tablet Take 50 mg by mouth 3 (three) times daily. As needed for pain         ROS- all systems are reviewed and negative except as per HPI  Physical Exam: Filed Vitals:   11/15/10 1037  BP: 122/64  Pulse: 56  Height: 5' (1.524 m)  Weight: 116 lb 1.9 oz (52.672 kg)    GEN- The patient is well appearing, alert and oriented x 3 today.   Head- normocephalic, atraumatic Eyes-  Sclera clear, conjunctiva pink Ears- hearing intact Oropharynx- clear Neck- supple, no JVP Lymph- no cervical lymphadenopathy Lungs- Clear to ausculation bilaterally, normal work of breathing Chest- R sided pacemaker pocket is well healed Heart- Regular rate and rhythm,  2/6 SEM LUSB, no rubs or gallops  GI- soft, NT, ND, + BS Extremities- no clubbing, cyanosis, or edema MS- no significant deformity or atrophy Skin- no rash or lesion Psych- euthymic mood, full affect Neuro- strength and sensation are intact  Pacemaker interrogation- reviewed in detail today,  She has reached ERI battery status and reverted to VVI,  See PACEART report  Assessment and Plan:

## 2010-11-15 NOTE — Assessment & Plan Note (Signed)
She has reached ERI battery status and requires pulse generator replacement.  R/B/A to PPM pulse generator replacement were discussed at length with pt who wishes to proceed.  We will schedule the procedure at the next available time. See Arita Miss Art report No changes today

## 2010-11-15 NOTE — Assessment & Plan Note (Signed)
Stable No change required today  

## 2010-11-20 ENCOUNTER — Telehealth: Payer: Self-pay | Admitting: Internal Medicine

## 2010-11-20 NOTE — Telephone Encounter (Signed)
Pt is calling re procedure. Pt wants to talk to kelly to reschedule procedure.

## 2010-11-21 NOTE — Telephone Encounter (Signed)
Patient moved to 12/08/10 and will get labs on 12/01/10  Pt aware

## 2010-11-29 ENCOUNTER — Ambulatory Visit: Payer: Medicare Other | Admitting: *Deleted

## 2010-12-01 ENCOUNTER — Other Ambulatory Visit (INDEPENDENT_AMBULATORY_CARE_PROVIDER_SITE_OTHER): Payer: Medicare Other | Admitting: *Deleted

## 2010-12-01 DIAGNOSIS — I442 Atrioventricular block, complete: Secondary | ICD-10-CM

## 2010-12-01 DIAGNOSIS — I421 Obstructive hypertrophic cardiomyopathy: Secondary | ICD-10-CM

## 2010-12-01 LAB — CBC WITH DIFFERENTIAL/PLATELET
Eosinophils Relative: 2.4 % (ref 0.0–5.0)
Monocytes Absolute: 0.8 10*3/uL (ref 0.1–1.0)
Monocytes Relative: 9.8 % (ref 3.0–12.0)
Neutrophils Relative %: 74 % (ref 43.0–77.0)
Platelets: 367 10*3/uL (ref 150.0–400.0)
WBC: 8.2 10*3/uL (ref 4.5–10.5)

## 2010-12-01 LAB — BASIC METABOLIC PANEL
GFR: 80.11 mL/min (ref >60.00)
Glucose, Bld: 85 mg/dL (ref 70–99)
Potassium: 3.6 mEq/L (ref 3.5–5.1)
Sodium: 141 mEq/L (ref 135–145)

## 2010-12-05 ENCOUNTER — Ambulatory Visit (INDEPENDENT_AMBULATORY_CARE_PROVIDER_SITE_OTHER): Payer: Medicare Other | Admitting: Family Medicine

## 2010-12-05 ENCOUNTER — Encounter: Payer: Self-pay | Admitting: Family Medicine

## 2010-12-05 VITALS — BP 142/75 | HR 65 | Temp 97.8°F | Ht 63.0 in

## 2010-12-05 DIAGNOSIS — M109 Gout, unspecified: Secondary | ICD-10-CM

## 2010-12-05 DIAGNOSIS — M2550 Pain in unspecified joint: Secondary | ICD-10-CM

## 2010-12-05 MED ORDER — PREDNISONE (PAK) 10 MG PO TABS: 10.0000 mg | ORAL_TABLET | Freq: Every day | ORAL | Status: DC

## 2010-12-05 MED ORDER — PREDNISONE (PAK) 10 MG PO TABS: 10.0000 mg | ORAL_TABLET | Freq: Every day | ORAL | Status: AC

## 2010-12-05 MED ORDER — HYDROCODONE-ACETAMINOPHEN 7.5-500 MG PO TABS: 1.0000 | ORAL_TABLET | ORAL | Status: DC | PRN

## 2010-12-05 NOTE — Progress Notes (Signed)
  Subjective:    Patient ID: [name], female    DOB: [birth year], 70 y.o.   MRN: [id number]  HPI 1.  Right knee pain and swelling:  Woke up with red, warm, swollen knee on Sunday morning.  Pain 8-10/10 in severity.  Has never had swollen unilateral joint before.  Diagnosed with osteoarthritis BL knees.  No trauma to area, no recent injuries or MVA.  No fevers, chills, nausea, abdominal pain, vomiting.  Pain persists so she can to clinic.  Has Tramadol at home but has not taken anything for pain.     Review of Systems See HPI above for review of systems.       Objective:   Physical Exam Gen:  Alert, cooperative patient who appears stated age in minimal distress, sitting in wheelchair.  Vital signs reviewed. MSK:  Right knee erythematous/edematous.  Some warmth. Effusion noted supra-patellar and anterior to patella.  Tenderness throughout.  No joint or ligament laxity.           Assessment & Plan:

## 2010-12-05 NOTE — Assessment & Plan Note (Signed)
[facility]                            CARDIOLOGY OFFICE NOTE   CHARNICE, ZWILLING                     MRN:          [id number]  DATE:01/13/2007                            DOB:          Aug 01, 1940    PRIMARY CARE PHYSICIAN:  Dr. Ara Kussmaul [facility].   CLINICAL HISTORY:  Ms. Lafontant is 70 years old and has a history of a  complete heart block and has been treated with a Medtronic dual-chamber  pacemaker.  She also has a hypertrophic cardiomyopathy.  Her last  echocardiogram in 2004, showed mild SAM and mild left ventricular  outflow gradient and focal basal septal thickening.  The posterior wall  and septal thickness were not quantified at that time.  She says she has  been doing quite well and has had no recent chest pain, shortness of  breath or palpitations.   PAST MEDICAL HISTORY:  1. Atrial fibrillation.  2. History of a GI bleed.  __________  __________   CURRENT MEDICATIONS:  Norvasc, Toprol, Prilosec OTC, __________  currently.   PHYSICAL EXAMINATION:  VITAL SIGNS:  Blood pressure 1336/90, pulse 78  and regular.  NECK:  No venous distention.  Carotid pulses full without bruits.  CHEST:  Clear.  HEART:  The cardiac rhythm was regular.  There was a grade 2/6 systolic  murmur at the left sternal edge.  I could hear no diastolic murmur.  ABDOMEN:  Soft with normal bowel sounds.  No hepatosplenomegaly.  EXTREMITIES:  The peripheral pulses are full and there is no peripheral  edema.   We interrogated her pacemaker.  Her resting electrocardiogram showed  atrial tracing and ventricular pacing.  She had good thresholds on both  leads.  She was pacing the ventricle most all of the time and she was  tracking the atrium the majority of the time.   IMPRESSION:  1. Complete atrioventricular block.  2. Status post Medtronic dual-chamber pacer implantation with a Kappa-      701 device, with good pacer  function - pacer-dependent.  3. Hypertension.  4. Peptic ulcer disease with a history of gastrointestinal bleed.  5. Paroxysmal atrial fibrillation, not felt to be a Coumadin      candidate.   RECOMMENDATIONS:  I think Ms. Poteat is doing well.  Will plan to  resume her Norvasc and beta blocker.  Will follow her on [place] and  see her back in one year.  I will plan to arrange for her to have an  echocardiogram after her next visit, since she has not had one in  some time. I also talked to her daughter about having the family members  checked for hypertrophic cardiomyopathy with echocardiography.     Bruce Elvera Lennox Juanda Chance, MD, [facility]  Electronically Signed    BRB/MedQ  DD: 01/13/2007  DT: 01/14/2007  Job #: [id number]

## 2010-12-05 NOTE — Assessment & Plan Note (Signed)
[facility]                            CARDIOLOGY OFFICE NOTE   [name], [name]                     MRN:          [id number]  DATE:12/22/2007                            DOB:          15-Aug-1940    PRIMARY CARE PHYSICIAN:  Dr. Ara Kussmaul [facility].   CLINICAL HISTORY:  [name] is 70 years old and returns for  management of her pacemaker.  She had a Medtronic dual-chamber pacemaker  implanted for complete heart block in December 2003.  She also has  hypertrophic cardiomyopathy with mild SAM by echocardiography.  She says  she had been doing well recently with no chest pain, shortness breath or  palpitations.   Her past medical history is significant for a history of paroxysmal  atrial fibrillation, history of GI bleed.   CURRENT MEDICATIONS:  Include:  1. Norvasc 10 mg daily.  2. Protonix.  3. Toprol 100 mg daily.  4. Multivitamins.   On examination, the blood pressure was 131/84 and the pulse 59 and  regular.  There was no venous distension.  The carotid pulses were full  without bruits.  CHEST:  Was clear.  HEART:  Rhythm was regular.  There was a grade 2/6 systolic murmur at  the left sternal edge.  I could hear no diastolic murmur.  The abdomen was soft with normal bowel sounds.  Peripheral pulses were full.  No peripheral edema.   Electrocardiogram showed pacing in both chambers.  We interrogated the  pacemaker, and she had good thresholds in both leads.  She did have 48  mode switches, the longest which was one hour.   IMPRESSION:  1. Complete AV block.  2. Status post Medtronic dual chamber pacemaker implantation in 2003      with good pacer function, not pacer dependent.  3. Hypertrophic cardiomyopathy.  4. Hypertension.  5. Paroxysmal atrial fibrillation, not felt to be a Coumadin      candidate.  6. Peptic ulcer disease with a history of gastrointestinal bleed.   RECOMMENDATIONS:  I think Ms.  [name] is doing well.  We will not  make any change to her medications.  We will plan to see her back in  follow-up in a year.     Bruce Elvera Lennox Juanda Chance, MD, [facility]  Electronically Signed    BRB/MedQ  DD: 12/22/2007  DT: 12/22/2007  Job #: [id number]

## 2010-12-05 NOTE — Assessment & Plan Note (Signed)
[facility]                         ELECTROPHYSIOLOGY OFFICE NOTE   DAYLE, SHERPA                     MRN:          [id number]  DATE:05/26/2007                            DOB:          21-May-1941    The patient was seen today in the clinic on May 26, 2007, for follow-  up of her Medtronic model #701 Cappa.  Date of implant was July 17, 2002, for 2 to 1 AV block.   On interrogation of her device today, her battery voltage is 2.76 with  an estimated longevity of 3-1/2 years.  P waves measured 2.0 to 2.8  millivolts with an atrial capture threshold of 1 volt at 0.4  milliseconds and an atrial lead impedance of 421 ohms.  R waves were not  measured.  She is pacemaker dependent to a rate of 30 with a ventricular  capture threshold of 0.75 volts at 0.4 milliseconds and a ventricular  lead impedance of 623 ohms.  There were 50 mode switch episodes totally  0.1% on the time.  She is not on Coumadin therapy.  She is ventricularly  pacing 99.8% of the time.  Capture adaptive is programmed on.  No  changes were made in her parameters.  She will send a [facility]  transmission in February and return in June to see Dr. Juanda Chance.      Altha Harm, LPN  Electronically Signed      Everardo Beals. Juanda Chance, MD, [facility]  Electronically Signed   PO/MedQ  DD: 05/26/2007  DT: 05/27/2007  Job #: [id number]

## 2010-12-06 ENCOUNTER — Telehealth: Payer: Self-pay | Admitting: Internal Medicine

## 2010-12-06 NOTE — Telephone Encounter (Signed)
Spoke with Patient's daughter . Patient was seen by PCP Dr. Nehemiah Massed yesterday 12/05/10. For knee swelling and pain. PCP prescribed Lortab 7.5/ 500 mg  every 4 hours as needed for pain, and Prednisone 10 mg 21 tablets pack to titrate down then stop. Pt's daughter said pt. Is having a pulse generator replacement on Friday 12/08/10. She would like to now if pt. Needs to stop take these medications prior the procedure. Pt's daughter was made aware that Prednisone can't be stop abruptly. I will send this message to MD and his nurse.

## 2010-12-06 NOTE — Telephone Encounter (Signed)
Pt was given prednisone 10mg   & hydroco/acteamin 7.5/500mg  by Dr. Nehemiah Massed PCP and they need to know if patient should betaking this and dose she need to stop the meds prior to surgery

## 2010-12-07 DIAGNOSIS — Z8739 Personal history of other diseases of the musculoskeletal system and connective tissue: Secondary | ICD-10-CM | POA: Insufficient documentation

## 2010-12-07 NOTE — Assessment & Plan Note (Signed)
Aspiration of joint performed: Informed consent obtained and placed in chart.  Time out completed prior to procedure.  Betadine swabs x 3 used to clean both supra-patellar and medial joint line aspects of knee, then wiped clear with alcohol swab to prevent staining.  1 cc Lidocaine with Epi raised in wheal supra-patellar via 30 gauge needle.  18 gauge needle then used to access supra-patellar bursa.  Unable to draw back any fluid, used 1 cc Lidocaine to raise wheal at medial aspect of joint line.  I was unable to draw back any fluid but Dr. Leveda Anna then stepped in and removed 0.25 cc serosanguinous synovial fluid.  Sent for culture, no growth thus far.  Likely gout -- treated with steroids and Vicodin rather than NSAIDs/colchicine as she has fairly recent history of bleeding peptic ulcers after ASA use.  To fu for improvement in 1-2 weeks, given strict reasons to return and what defines a septic joint.

## 2010-12-08 ENCOUNTER — Ambulatory Visit (HOSPITAL_COMMUNITY)
Admission: RE | Admit: 2010-12-08 | Discharge: 2010-12-08 | Disposition: A | Discharge status: Home / Self Care | Payer: Medicare Other | Source: Ambulatory Visit | Attending: Internal Medicine | Admitting: Internal Medicine

## 2010-12-08 ENCOUNTER — Ambulatory Visit (HOSPITAL_COMMUNITY): Payer: Medicare Other

## 2010-12-08 DIAGNOSIS — Z45018 Encounter for adjustment and management of other part of cardiac pacemaker: Secondary | ICD-10-CM | POA: Insufficient documentation

## 2010-12-08 DIAGNOSIS — I4891 Unspecified atrial fibrillation: Secondary | ICD-10-CM | POA: Insufficient documentation

## 2010-12-08 DIAGNOSIS — I442 Atrioventricular block, complete: Secondary | ICD-10-CM | POA: Insufficient documentation

## 2010-12-08 DIAGNOSIS — I421 Obstructive hypertrophic cardiomyopathy: Secondary | ICD-10-CM | POA: Insufficient documentation

## 2010-12-08 DIAGNOSIS — Z01811 Encounter for preprocedural respiratory examination: Secondary | ICD-10-CM

## 2010-12-08 LAB — CBC
Hemoglobin: 12.4 g/dL (ref 12.0–15.0)
MCHC: 32.7 g/dL (ref 30.0–36.0)
Platelets: 379 10*3/uL (ref 150–400)
RDW: 14.3 % (ref 11.5–15.5)

## 2010-12-08 LAB — BASIC METABOLIC PANEL
BUN: 25 mg/dL — ABNORMAL HIGH (ref 6–23)
Calcium: 9.8 mg/dL (ref 8.4–10.5)
Creatinine, Ser: 0.73 mg/dL (ref 0.4–1.2)
GFR calc non Af Amer: >60 mL/min (ref >60)
Glucose, Bld: 89 mg/dL (ref 70–99)
Sodium: 142 mEq/L (ref 135–145)

## 2010-12-08 LAB — PROTIME-INR
INR: 1.1 (ref 0.00–1.49)
Prothrombin Time: 14.4 seconds (ref 11.6–15.2)

## 2010-12-08 NOTE — Assessment & Plan Note (Signed)
[facility]                           ELECTROPHYSIOLOGY OFFICE NOTE   ANNASOFIA, POHL                     MRN:          [id number]  DATE:05/27/2006                            DOB:          1940/10/26    REASON FOR VISIT:  Ms. Veley was seen today in the clinic on May 27, 2006 for followup of her Medtronic Model #701 Kappa.  Date of implant was  July 17, 2002 for 2:1 heart block and presyncope.   On interrogation of her device today her battery voltage is 2.77.  P waves  measured 2.0 to 2.8 millivolts with an atrial pacing threshold of 1 volt at  0.4 milliseconds and an atrial lead impedence of 425.  R waves were not  measured.  She is pacemaker dependent to a rate of 30 with a ventricular  pacing threshold 0.75 volts at 0.4 milliseconds and a ventricular lead  impedence of 655.  There were 55 mode switch episodes, all very short in  duration totally 0% of the time since last interrogation.  She was signed up  today for [facility] and will send a transmission at 3, 6 and 9 month's time  with a return office visit in one year.      Altha Harm, LPN  Electronically Signed      Everardo Beals. Juanda Chance, MD, [facility]  Electronically Signed   PO/MedQ  DD: 05/27/2006  DT: 05/28/2006  Job #: [id number]

## 2010-12-08 NOTE — Op Note (Signed)
NAME:  [name], SIME NO.:  [id number]   MEDICAL RECORD NO.:  [id number]          PATIENT TYPE:  AMB   LOCATION:  ENDO                         FACILITY:  [facility]   PHYSICIAN:  Anselmo Rod, M.D.  DATE OF BIRTH:  02-25-1941   DATE OF PROCEDURE:  12/19/2005  DATE OF DISCHARGE:  12/19/2005                                 OPERATIVE REPORT   PROCEDURE PERFORMED:  Screening colonoscopy.   ENDOSCOPIST:  Anselmo Rod, M.D.   INSTRUMENT USED:  Olympus video colonoscope.   INDICATION FOR PROCEDURE:  A 70 year old African-American female undergoing  screening colonoscopy.  Rule out colonic polyps, masses, etc.   PREPROCEDURE PREPARATION:  Informed consent was procured from the patient.  The patient was fasted for 4 hours prior to the procedure and prepped with  OsmoPrep pills the night of and in the morning of the procedure.  The risks  and benefits of the procedure, including a 10% miss rate of cancer and  polyps, were discussed with the patient as well.  She received a gram of  Ancef for prophylaxis prior to the procedure.   PREPROCEDURE PHYSICAL:  VITAL SIGNS:  The patient had stable vital signs.  NECK:  Supple.  CHEST:  Clear to auscultation.  S1, S2 regular.  A pacemaker is present on  the right chest.  ABDOMEN:  Soft with normal bowel sounds.   DESCRIPTION OF PROCEDURE:  The patient was placed in the left lateral  decubitus position and sedated with 50 mcg of fentanyl and 5 mg of Versed in  slow incremental doses.  Once the patient was adequately sedate and  maintained on low-flow oxygen and continuous cardiac monitoring, the Olympus  video colonoscope was advanced from the rectum to the cecum.  There was a  large amount of stool in the colon.  Multiple washes were done.  The  appendiceal orifice and ileocecal valve were clearly visualized and  photographed.  There were small internal hemorrhoids seen on retroflexion.  There was evidence of sigmoid  diverticulosis.  No masses or polyps were  seen.  The patient tolerated the procedure well without any obvious  complications.   IMPRESSION:  1.  Small, nonbleeding internal hemorrhoids.  2.  Sigmoid diverticulosis.  3.  Large amount of residual stool in the colon, multiple washes done.  4.  No masses or polyps seen.   RECOMMENDATIONS:  1.  Continue a high-fiber diet with liberal fluid intake.  Brochures on      diverticulosis have been given to the patient for her education.  2.  Outpatient follow-up as the need arises in the future.  3.  Repeat colonoscopy in the next 10 years unless the patient develops any      abnormal symptoms in the interim, in which case she should contact the      office immediately.      Anselmo Rod, M.D.  Electronically Signed     JNM/MEDQ  D:  12/20/2005  T:  12/21/2005  Job:  [id number]   cc:   Penni Bombard, MD  Fax: [phone]

## 2010-12-08 NOTE — Discharge Summary (Signed)
NAMEMarland Kitchen  Sonya, [name]:  [id number]   MEDICAL RECORD NO.:  [id number]                   PATIENT TYPE:  INP   LOCATION:  2006                                 FACILITY:  [facility]   PHYSICIAN:  Charlies Constable, M.D.                  DATE OF BIRTH:  1941/07/13   DATE OF ADMISSION:  08/07/2003  DATE OF DISCHARGE:  08/09/2003                                 DISCHARGE SUMMARY   PROCEDURE:  None.   HOSPITAL COURSE:  [name] is a 70 year old female with a history of  complete heart block who is status post DDD pacemaker. She also has  hypertrophic cardiomyopathy and hypertension. She has had a negative  Cardiolite in the past but has never been catheterization. On the day of  admission, [name] has onset of tachy palpitations, feeling that her  heart was racing that were associated with chest tightness and shortness  of breath as well as some lightheadedness. She has two episodes and came to  the emergency room. She was admitted for further evaluation and treatment.   Her initial CK-MB was mildly elevated at 135/5.0 with a troponin of 0.08.  Repeat troponins were negative, and her CK-MBs were trending down. It was  felt that this was not clear cut for cardiac ischemia as her creatinine was  elevated at 2.4.   [name] had no further episodes of chest pain, shortness of breath,  or tachy palpitations. It was felt that an outpatient Cardiolite was  adequate for evaluation. This was scheduled at the office on Monday.   Her ISTAT creatinine initially was 1.4, but then when the creatinine was  checked with a CMET, it was 2.4. It was rechecked on August 09, 2003 and  still elevated at 2.5 with a BUN of 37. Her Hyzaar was discontinued, and she  is to get a BMET later this week. Further evaluation and treatment will  depend on those results. It was felt that she was dehydrated. On the point  of care markers, her myoglobin was elevated at 371, and  this was also  concerning for dehydration.   As part of her evaluation, her BMET was checked and not significantly  elevated at 147. Cholesterol profile was also performed which showed a total  cholesterol of 181, triglycerides 96, HDL 28, LDL 134. Dr. Juanda Chance will  evaluate the results when he sees the patient in followup and advised on  treatment.   By August 09, 2003, [name] was ambulating without chest pain or  shortness of breath. She had initially been placed on aspirin, but because  of history of peptic ulcer disease, this is on hold. Possible coated baby  aspirin can be added to her medication regimen if the Cardiolite is abnormal  but otherwise will hold on this for now. Dr. Juanda Chance evaluated [name]  on August 09, 2003 and considered her stable for discharge with  an  outpatient Cardiolite and office visit arranged.   LABORATORY VALUES:  Hemoglobin 12.0, hematocrit 36.7, WBCs 9.8, platelets  311. Sodium 136, potassium 3.9, chloride 107, CO2 22, BUN 37, creatinine  2.5, glucose 92. Other CMET values within normal limits.   Chest x-ray:  Lungs were clear. No acute cardiopulmonary findings.   DISCHARGE CONDITION:  Improved.   DISCHARGE DIAGNOSES:  1. Chest tightness, associated with tachy palpitations and presyncope.     Followup Cardiolite today.  2. Tachy palpitations with presyncope, followup in the office with Dr.     Juanda Chance, possible event monitor.  3. History of third degree heart block, status post DDD pacemaker.  4. Hypertrophic cardiomyopathy with no outflow track obstruction by     echocardiogram.  5. Hypertension.  6. History of peptic ulcer disease.  7. Arthritis.  8. History of iron-deficiency anemia.   DISCHARGE INSTRUCTIONS:  Activity level was to be as tolerated. She is to  stick to a low fat and salt diet. She is to follow up at the office today at  1:00. She is to get a BMET on this Thursday or Friday. She is to follow up  with Dr. Ophelia Charter  as needed. She is to followup with Dr. Juanda Chance on February 9  at 4:15.   DISCHARGE MEDICATIONS:  Toprol-XL 50 mg q.d.      Theodore Demark, P.A. [facility]                  Charlies Constable, M.D.    RB/MEDQ  D:  08/09/2003  T:  08/09/2003  Job:  [id number]   cc:   Jonah Blue, M.D.  [facility] - [address]  [place], [place] [address]  Fax: [phone]

## 2010-12-08 NOTE — H&P (Signed)
NAME:  [name], [name]:  [id number]   MEDICAL RECORD NO.:  [id number]                   PATIENT TYPE:  INP   LOCATION:  1823                                 FACILITY:  [facility]   PHYSICIAN:  Marrian Salvage. Freida Busman, M.D. [facility]            DATE OF BIRTH:  11/06/1940   DATE OF ADMISSION:  08/07/2003  DATE OF DISCHARGE:                                HISTORY & PHYSICAL   CHIEF COMPLAINT:  Chest pain, palpitations, unstable angina.   HISTORY OF PRESENT ILLNESS:  The patient is a 70 year old female with a  history of complete heart block, status post pacemaker placement in 2003, as  well as hypertension and mild hypertrophic cardiomyopathy.  She was in her  usual state of health until about 2-3 days prior to admission when she  developed upper respiratory infection symptoms with cough.  She had no  fevers but did note mild chills, nasal congestion, and significantly  decreased p.o. intake.  Her urine output remained relatively normal.  She  had no significant shortness of breath.  On the morning of admission at  about 8, she had sudden onset of palpitations and racing heart symptoms  lasting for about ten minutes.  This was associated with mild chest  tightness.  She did not take her heart rate at this time.  Eventually, the  symptoms resolved on their own with rest.  Then at approximately noon, she  got up to go to the bathroom, on the way back had onset of lightheadedness,  flushing, palpitations, and recurrent chest pressure.  Again, these symptoms  resolved slowly on their own.  The daughter called EMS.  On arrival, the  paramedics noted the patient's heart rate was 150 with a blood pressure of  120/palp.  An EKG had a rate of 124 and was reported as Afib with inverted P  waves, but EKG was not transmitted and not available for viewing.  In the  emergency department, the patient was pain free and asymptomatic.  Her pulse  was in the 80s there with EKG showing sinus  rhythm and PACs with ventricular  pacing.  Her point-of-care cardiac markers were just mildly elevated and  consequently the [facility] Consult Service was called.   PAST MEDICAL HISTORY:  1. A third heart block in December 2003, status post DDD pacemaker     placement.  She had been on beta-blocker at the time.  2. Hypertrophic cardiomyopathy, likely secondary to hypertension.  No     outflow tract obstruction.  3. Hypertension.  4. Peptic ulcer disease with H. pylori negative.  5. Arthritis.  6. Iron deficiency anemia.  7. Negative Cardiolite stress test in 2001, per the patient.  Note:  The last pacemaker interrogation April 2004.   ALLERGIES:  The patient reports an ASPIRIN allergy due to a history of  peptic ulcer disease.   MEDICATIONS:  1. Toprol XL 25 mg daily.  2.  Hyzaar 100/25 one tab daily.  3. P.r.n. cold medication taken one in two days prior to admission.   SOCIAL HISTORY:  The patient lives in [place] with her daughter.  She is  not employed.  She has three children and is single.  She does not use  tobacco, alcohol or drugs.  Her activity level is minimal.  Her diet is  normal, but she has had decreased p.o. the last few days.  No herbal  medication use.   FAMILY HISTORY:  Mother deceased at a young age of unknown etiology.  Father  died at age 37 of COPD.  No known early coronary artery disease.   REVIEW OF SYSTEMS:  Positive for chills, nasal discharge, chest pain,  palpitations.  Otherwise all systems negative in detail.   ADVANCE DIRECTIVES:  The patient is a full code.   PHYSICAL EXAMINATION:  VITAL SIGNS:  On arrival:  Pulse 83, blood pressure  117/69, respirations 16, saturation 100% on 2 liters.  GENERAL:  The patient was nontoxic-appearing and conversant.  She had  significant nasal congestion and a mild infrequent cough.  NECK:  No carotid bruits.  JVP was flat.  No lymphadenopathy.  HEART:  Regular, rate and rhythm.  Normal S1 S2.  No  appreciable S4.  A 2/6  murmur at the left lower sternal border.  Pulses were 2+ in the femorals and  DPs without carotid bruits.  LUNGS:  Clear to auscultation bilaterally.  No rash.  ABDOMEN:  Soft and nontender.  CHEST WALL:  Nontender.  RECTAL:  Negative.  EXTREMITIES:  With no significant edema.  NEUROLOGIC:  The patient was alert and oriented.   LABORATORIES:  EKG showed a normal sinus rhythm with some PACs at a rate of  90 with a PR interval of 160, QRS of 130 which was variable showing evidence  of V pacing with a possible fusion of native conduction.  QTC was  approximately 450.  No significant evidence of ischemia.   White blood cell count 9.8, hematocrit 37, platelets 311.  Sodium 139,  potassium 3.9, creatinine 1.4, glucose 99.  CK 135, MB 5.0, troponin I,  initially, less than 0.05 and then on repeat 0.08 with a myoglobin of 370  and repeated at 385.   IMPRESSION:  A 70 year old female with a history of pacemaker placement one  year ago for complete heart block, and mild hypertensive hypertrophic  cardiomyopathy.  In the setting of upper respiratory infection, she had two  episodes of palpitations with associated mild chest pain.  By history it  seemed that her chest discomfort was secondary to palpitations.  She had  mild dizziness but no true syncope.  She has no prior known history of  coronary artery disease.   DIFFERENTIAL DIAGNOSIS:  Seems that this would most likely be hypovolemia  due to decreased p.o. intake in the setting of viral illness with associated  sinus tachycardia and PACs; however, her cardiac markers are mildly abnormal  suggesting the possibility of unstable angina/non-ST elevation MI.  Other  concerns would be for pacemaker mediated tachycardia or SVT possibly  secondary to a cold medication, with associated mild demand ischemia.  Currently the patient is stable and chest pain free without significant  symptoms.  PLAN:  The patient being  admitted to telemetry bed.  She will receive mild  fluid bolus for evidence of mild hypovolemia.  We will continue to monitor  her cardiac markers for further elevation.  She will receive another  EKG in  the morning.  She will remain on telemetry and should have her pacemaker  interrogated when the pacemaker service is available.  She will continue on  beta-blocker and ARB.  We will start aspirin and heparin for possible  ACS.  Given a suspicion of demand ischemia versus ACS and current  asymptomatic state, Integrilin will currently be withheld.  Prior to  discharge, the patient will need an ischemia workup with either stress or  cardiac catheterization.                                                Marrian Salvage Freida Busman, M.D. [facility]    LAA/MEDQ  D:  08/08/2003  T:  08/08/2003  Job:  [id number]

## 2010-12-08 NOTE — Discharge Summary (Signed)
NAME:  [name], [name]:  [id number]   MEDICAL RECORD NO.:  [id number]                   PATIENT TYPE:  INP   LOCATION:  2011                                 FACILITY:  [facility]   PHYSICIAN:  Charlton Haws, M.D. [facility]              DATE OF BIRTH:  [birth year]   DATE OF ADMISSION:  07/14/2002  DATE OF DISCHARGE:  07/22/2002                           DISCHARGE SUMMARY - REFERRING   DISCHARGE DIAGNOSES:  1. Third degree atrioventricular block, bradycardic, symptomatic with     presyncope requiring permanent pacemaker implantation.  2. Hypertensive cardiomyopathy.  3. Iron deficiency anemia.   HOSPITAL COURSE:  [name] is a 70 year old female who was seen in the  office on 07/14/02 after feeling fatigued and weak.  Prior to her arrival to  the office she did experience a presyncopal episode where the daughter  called her before she hit the floor.  She denied any total loss of  consciousness.  She has a history of hypertensive cardiomyopathy with no  significant outflow tract gradient and mild mitral regurgitation.  In the  office her electrocardiogram revealed a second degree A-V block, ventricular  rate in the 30s.  For this reason the patient was hospitalized.  She had  been on Toprol XL 100 mg a day and this was immediately stopped.  Despite  stopping this medication the patient remained bradycardic and eventually  required permanent pacemaker implantation on 07/17/02.  A dual chamber  pacemaker, Medtronic device was utilized.  In addition, the patient was noted to have iron deficiency anemia during the  hospitalization and ultimately required management of this through the care  of Dr. Ophelia Charter from the family practice service as well as Dr. Arlyce Dice.  The  patient eventually underwent an EGD which revealed a marginal ulcer but with  no active bleeding.  H. pylori test was obtained and this was negative.   LABORATORY DATA:  Laboratory studies during  the patients stay included H.  pylori 0.34 (negative), ESR 41, white count 8.6, hemoglobin 8.9, hematocrit  27.3, platelets 311,000, sodium 141, potassium 3.8, BUN 11, creatinine 1.1,  AST 48, ALT 49.  Cardiac enzymes were negative.  TSH 1.279, iron 10, TIBC  314, iron saturation 3, B12 299, RBC folate 314, ferritin 9.  The patient  was started on iron promptly and the plan was to have the patient's  reticulocyte count followed up as an outpatient in the family practice  clinic.  If the count was not elevated the patient will eventually need  parenteral iron.   DISPOSITION:  The patient was felt to be stable for discharge to home by  07/22/02 on the following medications:  1. Prilosec 20 mg one p.o. every day.  2. Iron sulfate 325 mg t.i.d.  3. Multivitamin daily.  4. Norvasc 10 mg a day.  5. A new dose of metoprolol will be started at 25 mg one p.o. every day.  6. Tylenol as needed for pain.   DISCHARGE INSTRUCTIONS:  She is to gradually increase her arm motion as  directed over one weeks time.  She is to remain on a low fat diet.  She is  to clean over the incision site gently with soap and water and not to allow  water to directly hit the site while showering.  She is to follow up with  Dr. Ophelia Charter on 07/31/02 at 9:20 AM, [facility] on 07/29/02 at 11:15 AM  for pacemaker site check and then to follow up with the pacemaker nurse on  08/12/02 at 9:45 AM.  At this point a follow up appointment with Dr. Graciela Husbands  will be made.      Guy Franco, P.A. [facility]                      Charlton Haws, M.D. [facility]    LB/MEDQ  D:  07/22/2002  T:  07/22/2002  Job:  [id number]   cc:   Guy Franco, P.A. [facility]  [address]  [place], [place] [address]  Fax: [phone]   Charlton Haws, M.D. [facility]   Jonah Blue, M.D.  Family Prac Resident - [address]  [place], [place] [address]  Fax: [phone]   Teena Irani. Arlyce Dice, M.D.  [address]  [place]  [place] [address]  Fax: [phone]

## 2010-12-08 NOTE — H&P (Signed)
NAMEMarland Kitchen  CAELYNN, MARSHMAN NO.:  [id number]   MEDICAL RECORD NO.:  [id number]          PATIENT TYPE:  INP   LOCATION:  2905                         FACILITY:  [facility]   PHYSICIAN:  Melina Fiddler, MD DATE OF BIRTH:  June 08, 1941   DATE OF ADMISSION:  09/16/2005  DATE OF DISCHARGE:                                HISTORY & PHYSICAL   CHIEF COMPLAINT:  Weak, tarry stools for 5 days.   HISTORY OF PRESENT ILLNESS:  The patient is a 70 year old female who was in  normal state of health on Coumadin who comes in with new weakness, decreased  energy, and funny stools for 5 days. She says her stools were dark and  tarry with some gross blood. No abdominal pain, no hematemesis, no bloody  cough. The patient has been essentially bed-bound for these 5 days because  she has been so tired. She also complains of dizziness for the last day. She  has had decreased p.o. intake secondary to no appetite, no problems with  nausea. She complains of weight loss.   REVIEW OF SYSTEMS:  Negative for chest pain, shortness of breath, diarrhea,  and constipation.   PAST MEDICAL HISTORY:  1.  Arthritis.  2.  Hypertension.  3.  Hypertrophic cardiomyopathy, likely secondary to hypertension.  4.  GERD.  5.  Aortic stenosis.  6.  Osteoarthritis.  7.  Iron deficiency anemia.  8.  History of low back pain.  9.  Peptic ulcer disease status post antrectomy/vagotomy.  10. Third degree heart block, status post pacer December 2003.   MEDICATIONS:  1.  Ferrous sulfate 325 mg p.o. b.i.d.  2.  Hyzaar 100/25 mg p.o. daily.  3.  Prilosec over-the-counter daily p.r.n.  4.  Toprol-XL 25 mg p.o. daily.  5.  Warfarin 5 mg p.o. daily. This was started July 2006 for atrial      fibrillation.   ALLERGIES:  The patient is allergic to:  1.  ASPIRIN.  2.  CAFFEINE.  3.  ORPHENADRINE.   FAMILY HISTORY:  The patient's daughter has hypertension. The patient's  sister has breast cancer that was diagnosed in  her late 2s. The patient's  father died of TIA and COPD in his 66s.   SOCIAL HISTORY:  The patient lives with her daughter, who is 82. She works  at [employer]. No smoking, alcohol, or drug use. She has two  daughters. She lives with Nettie Elm and she has another daughter, Marylene Land, and  one other son. The patient has occasional sexual activity. She is divorced  but her ex-husband died in 01-13-2004of cancer.   PHYSICAL EXAMINATION:  VITAL SIGNS:  Temperature 99.1, pulse 103 but came  down to 85, respirations 20, blood pressure 122/76, 100% on room air.  GENERAL:  The patient is a well-appearing female in no apparent distress,  resting in bed.  MENTAL STATUS:  The patient is alert and oriented x3.  HEENT:  Head is atraumatic. Pupils are equal, round, and reactive to light  with extraocular muscles intact. Left tympanic membranes has fluid.  Oropharynx is clear.  LUNGS:  Clear  to auscultation bilaterally.  HEART:  Regular rate and rhythm with a 3/6 systolic ejection murmur. At  baseline, the patient has a 2/6 systolic ejection murmur.  ABDOMEN:  Soft, nontender, positive bowel sounds.  EXTREMITIES:  No clubbing, cyanosis, or edema.  PULSES:  With 2+ pedal pulses.  RECTAL:  The patient was fecal occult blood positive.  NEUROLOGIC:  CN II-XII was intact. Strength was 5/5 upper and lower  extremities.   LABORATORIES AND TESTS:  The patient was fecal occult blood positive. White  blood cells 8.9, H&H 6.2/17.5, platelets 396. Sodium 134, potassium 3.7,  chloride 104, bicarb 23, BUN 21, creatinine 1.1, glucose 92, pH 7.418, pCO2  35.   ASSESSMENT AND PLAN:  This is a 70 year old female with:  1.  Rectal bleeding. The patient is on Coumadin for atrial fibrillation. Her      INR was pending at the time but came back at 1.1. The patient was fecal      occult blood positive per Dr. Ethelda Chick in the ER. The patient is      getting 2 units of packed red blood cells. We will stop the  FFP ordered      by the ER doctors and the patient does not need vitamin K secondary to      her almost normal INR. The patient will need an EGD. We will call GI and      give her two large-bore IVs.  2.  Cardiology. We will try to get the patient's hemoglobin greater than 10      secondary to history of cardiomyopathy, aortic stenosis. MI. EKG was      paced.  3.  Hypertension. We will continue the patient's Hyzaar. Creatinine 1.1.      Toprol-XL.  4.  Gastroesophageal reflux disease. We will have the patient on Prilosec      b.i.d. given the concern for peptic ulcer disease.  5.  Decreased p.o. intake. The patient may need Megace when taking p.o.  6.  Dehydration. The patient will get boluses with the packed red blood      cells. We will then start her on maintenance IV fluid, half normal      saline at 125 mL/hour.  7.  Prophylaxis. SCDs, PPI b.i.d.      Rolm Gala, M.D.    ______________________________  Melina Fiddler, MD    HG/MEDQ  D:  09/16/2005  T:  09/17/2005  Job:  [id number]

## 2010-12-08 NOTE — Consult Note (Signed)
NAMEMarland Kitchen  [name], [name]:  [id number]   MEDICAL RECORD NO.:  [id number]                   PATIENT TYPE:  INP   LOCATION:  2906                                 FACILITY:  [facility]   PHYSICIAN:  Jonah Blue, M.D.                DATE OF BIRTH:  Sep 06, 1940   DATE OF CONSULTATION:  07/16/2002  DATE OF DISCHARGE:                                   CONSULTATION   CHIEF COMPLAINT:  Anemia.   HISTORY OF PRESENT ILLNESS:  The patient is a 70 year old African-American  female patient of mine who has a longstanding history of medical  noncompliance.  The patient was admitted by Dr. Graciela Husbands from [facility]  cardiology for symptomatic bradycardia secondary to A-V block with  presyncopal episode.  The patient was found to have anemia during her  hospitalization, and thus, the family practice service was consulted for  this problem.   REVIEW OF SYSTEMS:  Significant for weakness and fatigue and a nonproductive  cough and generalized musculoskeletal weakness.   PAST MEDICAL HISTORY:  Significant for hypertension, idiopathic hypertrophic  cardiomyopathy, GERD, osteoarthritis, and aortic stenosis.  The patient had  an anastomotic bleed in 1/00 and a distant history of iron deficient anemia.   MEDICATIONS ON ADMISSION:  1. Nexium 40 mg p.o. q.d.  2. Norvasc 5 mg p.o. q.d.  3. Toprol-XL 100 mg p.o. q.d.  This was received by cardiology secondary to     the patient's bradycardia.   ALLERGIES:  ASPIRIN, CAFFEINE and ORPHENADRINE.   SOCIAL HISTORY:  The patient lives with her daughter.  She works at [employer]  She denies alcohol, drugs, or smoking.   FAMILY HISTORY:  Significant for daughter with hypertension.   OBJECTIVE:  VITAL SIGNS:  Afebrile, blood pressure 142/50, pulse 42,  respirations 12, saturating 99% on 2 L of O2.  GENERAL:  The patient is somewhat lackluster but is in no acute distress,  has some generalized pallor as well as gingival  pallor.  HEENT:  PERRLA, EOMI, gingival pallor as noted previously.  Pharynx clear.  Moderately good dentition.  NECK:  Supple, no lymphadenopathy, no thyromegaly.  CARDIOVASCULAR:  Regular rhythm with bradycardia and hyperdynamic heart  sounds.  LUNGS:  Clear to auscultation bilaterally.  ABDOMEN:  Soft, nontender, nondistended.  LOWER EXTREMITIES:  No clubbing, cyanosis, edema or calf tenderness.  RECTAL:  Heme-negative, moderately-hard brown stool in the rectal vault.   LABORATORY DATA:  WBC 9.8, hemoglobin 8.6, platelets 329,000.  Ferritin was  9, B12 279, iron 10, TIBC 314, percent ________.  Sodium 141, potassium 3.8,  chloride 111, CO2 23, BUN 11, creatinine 1.1, glucose 103, and calcium 8.9.  BNP is 991.  TSH 1.279.  Coags are within normal limits.  Cardiac enzymes  negative x3.  Hemoccult negative.  Echocardiogram shows hyperdynamic left  ventricle with EF of 75-85% with marked basal-septal hypertrophy, LV outflow  tract obstruction,  mild AR, moderate MR, left atrium mildly dilated, and  mild-to-moderate pulmonary hypertension.   ASSESSMENT AND PLAN:  A 70 year old African-American female with symptomatic  bradycardia an anemia.   Problem 1.  Bradycardia. The patient was discontinued off Toprol-XL after  cardiology plans for a permanent transvenous pacer placement tomorrow  morning.   Problem 2.  Anemia.  This is likely iron deficiency anemia as the patient  has had a prior history of iron deficiency anemia in the past, and her iron  studies do indicate that that is a concern.  The patient does also have a  history of a AVM with a GI bleed in the past, but she is heme-negative on  rectal exam.  We will guaiac her stools but presume that the diagnosis is  iron deficiency anemia.  Will start the patient on iron sulfate, FES04 325  mg p.o. q.i.d.  Would not transfuse at this time unless necessary for  surgery.   Problem 3.  Hypertension.  The patient is stable at this point  despite the  discontinuance of  Toprol.  The patient may need additional adjustment in  the future.   Problem 4.  Cardiomegaly.  Question whether the patient would benefit from  other non-beta blocker treatment for this condition, as per cardiology.   Problem 5.  Gastrointestinal.  The patient does have moderately-hard stool  in the rectal vault and is being started on iron which has a tendency to  cause constipation. Will start patient on Senokot-S two tabs p.o. q.h.s. and  follow her.                                               Jonah Blue, M.D.    Milas Gain  D:  07/16/2002  T:  07/17/2002  Job:  [id number]

## 2010-12-08 NOTE — Discharge Summary (Signed)
NAMEMarland Kitchen  MARYFER, TAUZIN NO.:  [id number]   MEDICAL RECORD NO.:  [id number]                   PATIENT TYPE:  INP   LOCATION:  2006                                 FACILITY:  [facility]   PHYSICIAN:  Charlies Constable, M.D.                  DATE OF BIRTH:  1940/09/16   DATE OF ADMISSION:  08/07/2003  DATE OF DISCHARGE:  08/09/2003                                 DISCHARGE SUMMARY   PROCEDURES:  None.   HOSPITAL COURSE:  Ms. Egloff is a 70 year old female with a history of  complete heart block status post pacemaker as well as mild left ventricular  hypertrophy on an echocardiogram.  She has a history of hypertension and had  a negative Cardiolite.  She had never been catheterized.  She has some  palpitations on the day of admission that she described as her heart racing  that lasted about 10 minutes and was associated with chest tightness.  Symptoms resolved.  She later had another episode with similar symptoms and  came to the emergency room.  She was admitted for further evaluation and  treatment.  Sinus rhythm with some PACs was seen on the monitor, and she was  ventricular paced.   She had some moderate elevation in her CK-MBs with a troponin that peaked at  0.08 which was the initial reading.  It was felt that this was possibly  secondary to renal insufficiency as she had a BUN of 36 and creatinine of  2.4 noted.  She has no history of renal insufficiency.   DICTATION ENDS HERE      Theodore Demark, P.A. [facility]                  Charlies Constable, M.D.    RB/MEDQ  D:  08/09/2003  T:  08/09/2003  Job:  [id number]

## 2010-12-08 NOTE — Cardiovascular Report (Signed)
NAMEMarland [name]  LILAS, DIEFENDORF NO.:  [id number]   MEDICAL RECORD NO.:  [id number]                   PATIENT TYPE:  INP   LOCATION:  2906                                 FACILITY:  [facility]   PHYSICIAN:  Charlies Constable, M.D. [facility]              DATE OF BIRTH:  May 21, 1941   DATE OF PROCEDURE:  07/17/2002  DATE OF DISCHARGE:                              CARDIAC CATHETERIZATION   CLINICAL HISTORY:  The patient is 70 years old and has hypertrophic  cardiomyopathy.  She recently developed presyncope and was seen in the  office in consultation by Dr. Berton Mount and was found to have 2:1 AV  block.  She is on beta blockers, and this was stopped, but her block  persisted and she was scheduled for permanent pacing.   PROCEDURE:  Implantation of a Medtronic Kappa DDD pacemaker (model number  [id number], serial number [id number]), and a Medtronic bipolar screw-in  ventricular lead (model number 5076-52 cm, serial number [id number]), and a  Medtronic bipolar screw-in atrial lead (model number 5076-45 cm, serial  number [id number]).   INDICATIONS FOR PROCEDURE:  Second-degree AV block and syncope.   ANESTHESIA:  Local Xylocaine, 1%.   ESTIMATED BLOOD LOSS:  Less than 20 cc.   COMPLICATIONS:  None.   DESCRIPTION OF PROCEDURE:  The procedure was performed in Laboratory #4.  The right anterior chest was prepped and draped in the usual fashion.  A  venogram was performed.  The skin and subcutaneous tissue were anesthetized  with 1% local Xylocaine.  Using a #18 gauge thin-walled needle, the  subclavian vein was entered and access was secured with a 0.038 wire.  We  had to use a Wholey wire to advance down the superior vena cava.  An  incision was made below the clavicle and extended to the prepectoral fascia.  A pocket was made inferior to the incision using blunt dissection.  Using  two #9-French sheaths, the atrial and ventricular leads were passed to the  right atrium.  The  0.038 wire was left in the subclavian vein for later  access.  A figure-of-eight suture was placed at the entry site for  hemostasis for later securing the leads.  The ventricular lead was  positioned in the right ventricular apex with good pacing parameters  described below.  For the atrial lead, we could not find a good position in  the right atrial appendage so the atrial lead was attached to the right  lateral atrial wall.  There were good parameters as described below.  After  removal of the stylets and the 0.038 wire, the figure-of-eight suture was  secured at the entry site.  The leads were attached to the generator with a  hex nut wrench.  The generator was implanted into the pocket and secured  loosely to the prepectoral fascia with 1-0 silk.  The subcutaneous tissue  was closed with running 2-0 Dexon.  The skin was closed with running 5-0  Dexon.   PACING PARAMETERS:  1. Atrial unipolar:  P wave 2.1 mV, minimum threshold to capture 1.2 volts,     resistance 422 ohms.  2. Atrial bipolar:  T wave 2.2 mV, minimum threshold to capture 0.6 volts,     resistance 530 ohms.  3. Ventricular unipolar:  R wave 9.0 mV, minimum threshold to capture 1.2     volts, resistance 864 ohms.  4.     Ventricular bipolar:  R wave 16.4 mV, minimum threshold to capture 1.8     volts, resistance 1100 ohms.  5. There was no pacing of the diaphragm on either lead at 10 volts in either     the unipolar or bipolar mode.   COMMENTS:  The patient tolerated the procedure well and left the laboratory  atrial tracking and ventricular pacing.                                                Charlies Constable, M.D. [facility]    BB/MEDQ  D:  07/17/2002  T:  07/17/2002  Job:  [id number]   cc:   Duke Salvia, M.D. [facility]   Teaching Service   Cardiac Catheterization Lab   Jonah Blue, M.D.  Family Prac Resident - [address]  [place], [place] [address]  Fax: [phone]

## 2010-12-08 NOTE — Discharge Summary (Signed)
NAME:  [name], LOK NO.:  [id number]   MEDICAL RECORD NO.:  [id number]          PATIENT TYPE:  INP   LOCATION:  4735                         FACILITY:  [facility]   PHYSICIAN:  Leighton Roach McDiarmid, M.D.DATE OF BIRTH:  [birth year]   DATE OF ADMISSION:  09/16/2005  DATE OF DISCHARGE:  09/19/2005                                 DISCHARGE SUMMARY   CONSULTATIONS:  Anselmo Rod, M.D., gastroenterology   PROCEDURE:  Esophagogastroduodenoscopy on February 25 which showed a  duodenal ulcer.   PROBLEMS:  1.  Duodenal ulcer.  2.  Atrial fibrillation.  3.  Hypertension.  4.  Hypertrophic cardiomyopathy.  5.  Gastroesophageal reflux disease.  6.  Aortic stenosis.  7.  Anemia.  8.  Arthritis.  9.  History of peptic ulcer disease.   LABORATORY DATA:  INR on admission was 1.1.  The patient was fecal occult  blood positive.  H&H on January 25, hemoglobin 7.6.  Creatinine on January  25 was 1.1.  BMP on February 28 prior to discharge, sodium 137, potassium  3.5, chloride 108, bicarb 23, glucose 89, BUN 9, creatinine 1, calcium 8.8.  CBC on the day of discharge, February 28, white blood cell count 8.2,  hemoglobin 11.3, hematocrit 32.2, platelets 393.   HOSPITAL COURSE:  This is a 70 year old female who was found to have a  duodenal ulcer and needed transfusion.   Problem 1:  Duodenal ulcer.  The patient came in with a five day history of  black, tarry stools that had some bright red blood with them.  An EGD was  done by Dr. Loreta Ave and associates and she was found to have a duodenal ulcer  with a clot.  This was treated with epinephrine.  The patient was then  watched closely for three days which was the highest chance for rebleed.  The patient was taken off her Coumadin and restarting the medication will be  discussed with her and her primary doctor.  The patient was told not to take  ibuprofen, aspirin, or any other similar medications.   Problem 2:  Atrial fibrillation.   The patient was rate controlled but her  Coumadin was stopped.   Problem 3:  Hypertension.  The patient was continued on her home dose of  Toprol XL and Hyzaar.   Problem 4:  Gastroesophageal reflux disease.  The patient came in on  Prilosec once daily.  She was discharged on Prilosec twice daily.   Problem 5:  Hypertrophic cardiomyopathy.   Problem 6:  Aortic stenosis.   Problem 7:  Anemia.  The patient received 3 units of packed red blood cells  during her hospitalization.  Her hemoglobin at discharge is 11.3.   Problem 8:  Arthritis.  The patient was given Tylenol for her arthritis  pain.   Problem 9:  History of peptic ulcer disease.   Problem 10:  H. pylori is pending and this will need to be followed up by  her primary MD.   DISCHARGE MEDICATIONS:  Ferrous sulfate 325 mg p.o. b.i.d., Hyzaar 100/25  p.o. daily, Prilosec OTC twice daily, Toprol XL  25 mg daily, stop Coumadin,  Megace 800 mg p.o. daily, Ensure or other protein shake with meals, multi-  vitamin daily.   The patient was a full code during her hospital stay.  The patient will  follow up with Dr. Melvyn Novas at the family practice, [phone], within 1-2 weeks.      Rolm Gala, M.D.    ______________________________  Leighton Roach McDiarmid, M.D.    Bennetta Laos  D:  09/19/2005  T:  09/19/2005  Job:  [id number]   cc:   Penni Bombard, MD  Fax: [phone]   Anselmo Rod, M.D.  Fax: [phone]

## 2010-12-09 LAB — BODY FLUID CULTURE
Gram Stain: NONE SEEN
Organism ID, Bacteria: NO GROWTH

## 2010-12-15 NOTE — Op Note (Signed)
NAME:  [name], [name]:  [id number]  MEDICAL RECORD NO.:  [id number]           PATIENT TYPE:  O  LOCATION:  [facility]                         FACILITY:  [facility]  PHYSICIAN:  Hillis Range, MD       DATE OF BIRTH:  [birth year]  DATE OF PROCEDURE: DATE OF DISCHARGE:  12/08/2010                              OPERATIVE REPORT   SURGEON:  Hillis Range, MD  PREPROCEDURE DIAGNOSES: 1. Complete heart block. 2. Hypertrophic obstructive cardiomyopathy. 3. Paroxysmal atrial fibrillation. 4. Pacemaker at elective replacement indicator.  POSTPROCEDURE DIAGNOSES: 1. Complete heart block. 2. Hypertrophic obstructive cardiomyopathy. 3. Paroxysmal atrial fibrillation. 4. Pacemaker at elective replacement indicator.  PROCEDURES: 1. Skin and pocket revision. 2. Pacemaker pulse generator replacement.  INTRODUCTION:  [name] is a pleasant 70 year old female with a history of complete heart block status post implantation of a pacemaker by Dr. Juanda Chance in 2003.  She has done well since her pacemaker was implanted.  She has recently converted to ERI battery status.  She now presents for pacemaker pulse generator replacement.  DESCRIPTION OF THE PROCEDURE:  Informed written consent was obtained and the patient was brought to the electrophysiology lab in the fasting state.  She was adequately sedated with intravenous Versed and fentanyl as outlined in the nursing report.  The patient's pacemaker was interrogated and confirmed to be at [facility] battery status.  She converted to VVIR pacing and AV dyssynchrony was observed on telemetry.  Her right chest was prepped and draped in the usual sterile fashion by the EP lab staff.  The skin overlying her existing pacemaker was infiltrated with lidocaine for local analgesia.  A 4-cm incision was made over her existing pacemaker.  Using a combination of sharp and blunt dissection, her pacemaker was exposed and gently removed from the pocket.   A single silk stitch was identified securing the device to the pocket and this was removed in its entirety.  The pocket was evaluated and there was no foreign matter or debris.  The atrial and ventricular leads were carefully inspected and their integrity was confirmed to be intact.  The atrial lead was a Medtronic model [id number] (serial number [id number]) lead implanted on July 17, 2002.  The right ventricular lead was a Medtronic model [id number] (serial number [id number]) lead implanted on July 17, 2002.  Atrial lead P-waves measured 3.6 mV with impedance of 396 ohms and a threshold of 0.9 V at 0.5 msec.  There were no underlying R-waves today, and the patient was completely device dependent.  Ventricular lead impedance was 554 ohms with a threshold of 1.1 V at 0.5 msec.  Both leads were therefore connected to a Medtronic Adapta L model ADDRL1 (serial number [id number]) pacemaker.  The pocket was then revised to accommodate this new device.  Electrocautery was required to assure hemostasis.  The pocket was then irrigated with copious gentamicin solution.  The pacemaker was then placed into the pocket.  The pocket was then closed in two layers with 2.0 Vicryl suture for the subcutaneous and subcuticular layers.  Steri-Strips and a sterile dressing were then applied.  There were no early apparent complications.  CONCLUSIONS: 1. Successful pulse generator replacement with a Medtronic Adapta L     dual-chamber pacemaker for end-of-life battery status and complete     heart block. 2. No early apparent complications.     Hillis Range, MD     JA/MEDQ  D:  12/08/2010  T:  12/09/2010  Job:  [id number]  Electronically Signed by Hillis Range MD on 12/15/2010 05:54:01 PM

## 2010-12-27 ENCOUNTER — Other Ambulatory Visit: Payer: Self-pay | Admitting: Internal Medicine

## 2010-12-27 ENCOUNTER — Ambulatory Visit (INDEPENDENT_AMBULATORY_CARE_PROVIDER_SITE_OTHER): Payer: Medicare Other | Admitting: *Deleted

## 2010-12-27 DIAGNOSIS — I442 Atrioventricular block, complete: Secondary | ICD-10-CM

## 2010-12-27 NOTE — Progress Notes (Signed)
Wound check pacer 

## 2011-03-12 ENCOUNTER — Ambulatory Visit: Payer: Medicare Other | Admitting: Internal Medicine

## 2011-04-18 ENCOUNTER — Encounter: Payer: Self-pay | Admitting: Internal Medicine

## 2011-04-18 ENCOUNTER — Ambulatory Visit (INDEPENDENT_AMBULATORY_CARE_PROVIDER_SITE_OTHER): Payer: Medicare Other | Admitting: Internal Medicine

## 2011-04-18 DIAGNOSIS — Z950 Presence of cardiac pacemaker: Secondary | ICD-10-CM

## 2011-04-18 DIAGNOSIS — I421 Obstructive hypertrophic cardiomyopathy: Secondary | ICD-10-CM

## 2011-04-18 DIAGNOSIS — I4891 Unspecified atrial fibrillation: Secondary | ICD-10-CM

## 2011-04-18 DIAGNOSIS — I442 Atrioventricular block, complete: Secondary | ICD-10-CM

## 2011-04-18 LAB — PACEMAKER DEVICE OBSERVATION
AL IMPEDENCE PM: 384 Ohm
ATRIAL PACING PM: 51
BATTERY VOLTAGE: 2.79 V
RV LEAD IMPEDENCE PM: 599 Ohm
VENTRICULAR PACING PM: 100

## 2011-04-18 NOTE — Assessment & Plan Note (Signed)
Asymptomatic Not felt to be a coumadin or ASA candidate due to GI bleeding

## 2011-04-18 NOTE — Assessment & Plan Note (Signed)
Asymptomatic  No changes today

## 2011-04-18 NOTE — Progress Notes (Signed)
The patient presents today for routine electrophysiology followup.  Since last being seen in our clinic, the patient reports doing very well.  Today, she denies symptoms of palpitations, chest pain, shortness of breath, orthopnea, PND, lower extremity edema, dizziness, presyncope, syncope, or neurologic sequela.  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  The patient feels that she is tolerating medications without difficulties and is otherwise without complaint today.   Past Medical History  Diagnosis Date  . History of Guillain-Barre syndrome     9/11  . Diverticulosis 11/2005    colon, history  . Gastrointestinal hemorrhage 08/2005    Not felt to be a coumadin or ASA candidate  . Peptic ulcer disease 08/2005  . Paroxysmal atrial fibrillation     prior GI bleeding- not felt to be a candidate for ASA or coumadin  . Hypertension   . Atrioventricular block, complete     s/p PPM  . Hypertrophic obstructive cardiomyopathy   . Diastolic dysfunction 10/12/2009  . UGI bleed 04/2009  . Blood transfusion abn reaction or complication, no procedure mishap    Past Surgical History  Procedure Date  . Vagotomy 1995  . Cardiac pacemaker placement 2003/2004    , most recent generator change 5/12, MDT  . Fetal blood transfusion     Current Outpatient Prescriptions  Medication Sig Dispense Refill  . amLODipine (NORVASC) 10 MG tablet Take 10 mg by mouth daily.        Marland Kitchen HYDROcodone-acetaminophen (LORTAB) 7.5-500 MG per tablet Take 1 tablet by mouth every 4 (four) hours as needed for pain.  20 tablet  0  . metoprolol (TOPROL-XL) 100 MG 24 hr tablet Take 100 mg by mouth daily.        . Multiple Vitamin (MULTIVITAMIN) capsule Take 1 capsule by mouth daily.          Allergies  Allergen Reactions  . Aspirin     REACTION: doesn't take due to trouble w/ ulcers  . Orphenadrine Citrate     History   Social History  . Marital Status: Single    Spouse Name: N/A    Number of Children: N/A  .  Years of Education: N/A   Occupational History  . Not on file.   Social History Main Topics  . Smoking status: Never Smoker   . Smokeless tobacco: Not on file  . Alcohol Use: No  . Drug Use: No  . Sexually Active: Not on file   Other Topics Concern  . Not on file   Social History Narrative  . No narrative on file    Family History  Problem Relation Age of Onset  . Transient ischemic attack Father   . COPD Father   . Hypertension Daughter   . Breast cancer Sister     Physical Exam: Filed Vitals:   04/18/11 1004  BP: 167/97  Pulse: 66  Resp: 16  Height: 5\' 4"  (1.626 m)  Weight: 120 lb (54.432 kg)    GEN- The patient is well appearing, alert and oriented x 3 today.   Head- normocephalic, atraumatic Eyes-  Sclera clear, conjunctiva pink Ears- hearing intact Oropharynx- clear Neck- supple, no JVP Lymph- no cervical lymphadenopathy Lungs- Clear to ausculation bilaterally, normal work of breathing Chest- pacemaker pocket is well healed Heart- Regular rate and rhythm, no murmurs, rubs or gallops, PMI not laterally displaced GI- soft, NT, ND, + BS Extremities- no clubbing, cyanosis, or edema  Pacemaker interrogation- reviewed in detail today,  See PACEART  report  Assessment and Plan:

## 2011-04-18 NOTE — Patient Instructions (Addendum)
Your physician wants you to follow-up in:  May 2013  You will receive a reminder letter in the mail two months in advance. If you don't receive a letter, please call our office to schedule the follow-up appointment.  

## 2011-04-18 NOTE — Assessment & Plan Note (Signed)
Normal pacemaker function See Pace Art report No changes today  

## 2011-04-26 LAB — CBC
Hemoglobin: 11 g/dL — ABNORMAL LOW (ref 12.0–15.0)
MCHC: 32.2 g/dL (ref 30.0–36.0)
MCV: 88.1 fL (ref 78.0–100.0)
RBC: 3.88 MIL/uL (ref 3.87–5.11)
WBC: 9.8 10*3/uL (ref 4.0–10.5)

## 2011-05-08 ENCOUNTER — Other Ambulatory Visit: Payer: Self-pay | Admitting: Internal Medicine

## 2011-05-08 MED ORDER — METOPROLOL SUCCINATE ER 100 MG PO TB24: 100.0000 mg | ORAL_TABLET | Freq: Every day | ORAL | Status: DC

## 2011-05-14 ENCOUNTER — Telehealth: Payer: Self-pay | Admitting: Internal Medicine

## 2011-05-14 MED ORDER — AMLODIPINE BESYLATE 10 MG PO TABS: 10.0000 mg | ORAL_TABLET | Freq: Every day | ORAL | Status: DC

## 2011-05-14 NOTE — Telephone Encounter (Signed)
Pt needs refill amilodpine 10mg  qd and metoprolol 100mg  qd called into [facility] on [address] Quincy Carnes where told this would be done Tuesday of last week and they still have not heard anything

## 2011-06-05 ENCOUNTER — Other Ambulatory Visit: Payer: Self-pay | Admitting: Internal Medicine

## 2011-06-05 DIAGNOSIS — Z1231 Encounter for screening mammogram for malignant neoplasm of breast: Secondary | ICD-10-CM

## 2011-07-05 ENCOUNTER — Ambulatory Visit (HOSPITAL_COMMUNITY)
Admission: RE | Admit: 2011-07-05 | Discharge: 2011-07-05 | Disposition: A | Discharge status: Home / Self Care | Payer: Medicare Other | Source: Ambulatory Visit | Attending: Internal Medicine | Admitting: Internal Medicine

## 2011-07-05 DIAGNOSIS — Z1231 Encounter for screening mammogram for malignant neoplasm of breast: Secondary | ICD-10-CM

## 2011-08-24 ENCOUNTER — Other Ambulatory Visit: Payer: Self-pay | Admitting: Family Medicine

## 2011-12-10 ENCOUNTER — Ambulatory Visit (INDEPENDENT_AMBULATORY_CARE_PROVIDER_SITE_OTHER): Payer: Medicare Other | Admitting: Internal Medicine

## 2011-12-10 ENCOUNTER — Encounter: Payer: Self-pay | Admitting: Internal Medicine

## 2011-12-10 VITALS — BP 132/90 | HR 60 | Ht 61.5 in | Wt 128.8 lb

## 2011-12-10 DIAGNOSIS — I442 Atrioventricular block, complete: Secondary | ICD-10-CM

## 2011-12-10 DIAGNOSIS — I421 Obstructive hypertrophic cardiomyopathy: Secondary | ICD-10-CM

## 2011-12-10 DIAGNOSIS — I4891 Unspecified atrial fibrillation: Secondary | ICD-10-CM

## 2011-12-10 LAB — PACEMAKER DEVICE OBSERVATION
ATRIAL PACING PM: 51.6
BAMS-0001: 150 {beats}/min
RV LEAD THRESHOLD: 0.875 V
VENTRICULAR PACING PM: 100

## 2011-12-10 NOTE — Assessment & Plan Note (Signed)
Asymptomatic Not felt to be a coumadin or ASA candidate due to GI bleeding   

## 2011-12-10 NOTE — Patient Instructions (Signed)
Remote monitoring is used to monitor your Pacemaker of ICD from home. This monitoring reduces the number of office visits required to check your device to one time per year. It allows us to keep an eye on the functioning of your device to ensure it is working properly. You are scheduled for a device check from home on March 13, 2012. You may send your transmission at any time that day. If you have a wireless device, the transmission will be sent automatically. After your physician reviews your transmission, you will receive a postcard with your next transmission date.  Your physician wants you to follow-up in: 1 year with Dr Allred.  You will receive a reminder letter in the mail two months in advance. If you don't receive a letter, please call our office to schedule the follow-up appointment.  

## 2011-12-10 NOTE — Assessment & Plan Note (Signed)
Normal pacemaker function See Pace Art report No changes today  

## 2011-12-10 NOTE — Progress Notes (Signed)
PCP: Renold Don, MD, MD  [name] is a 71 y.o. female who presents today for routine electrophysiology followup.  Since last being seen in our clinic, the patient reports doing very well.  Today, she denies symptoms of palpitations, chest pain, shortness of breath,  lower extremity edema, dizziness, presyncope, or syncope.  The patient is otherwise without complaint today.   Past Medical History  Diagnosis Date  . History of Guillain-Barre syndrome     9/11  . Diverticulosis 11/2005    colon, history  . Gastrointestinal hemorrhage 08/2005    Not felt to be a coumadin or ASA candidate  . Peptic ulcer disease 08/2005  . Paroxysmal atrial fibrillation     prior GI bleeding- not felt to be a candidate for ASA or coumadin  . Hypertension   . Atrioventricular block, complete     s/p PPM  . Hypertrophic obstructive cardiomyopathy   . Diastolic dysfunction 10/12/2009  . UGI bleed 04/2009  . Blood transfusion abn reaction or complication, no procedure mishap    Past Surgical History  Procedure Date  . Vagotomy 1995  . Cardiac pacemaker placement 2003/2004    , most recent generator change 5/12, MDT  . Fetal blood transfusion     Current Outpatient Prescriptions  Medication Sig Dispense Refill  . amLODipine (NORVASC) 10 MG tablet Take 1 tablet (10 mg total) by mouth daily.  30 tablet  11  . metoprolol (TOPROL-XL) 100 MG 24 hr tablet Take 1 tablet (100 mg total) by mouth daily.  30 tablet  6  . Multiple Vitamin (MULTIVITAMIN) capsule Take 1 capsule by mouth daily.          Physical Exam: Filed Vitals:   12/10/11 1019  BP: 132/90  Pulse: 60  Height: 5' 1.5" (1.562 m)  Weight: 128 lb 12.8 oz (58.423 kg)    GEN- The patient is well appearing, alert and oriented x 3 today.   Head- normocephalic, atraumatic Eyes-  Sclera clear, conjunctiva pink Ears- hearing intact Oropharynx- clear Lungs- Clear to ausculation bilaterally, normal work of breathing Chest- pacemaker pocket is  well healed Heart- Regular rate and rhythm,2/6 SEM LUSB GI- soft, NT, ND, + BS Extremities- no clubbing, cyanosis, or edema  Pacemaker interrogation- reviewed in detail today,  See PACEART report  Assessment and Plan:

## 2011-12-10 NOTE — Assessment & Plan Note (Signed)
Asymptomatic. No changes 

## 2012-02-06 ENCOUNTER — Encounter: Payer: Self-pay | Admitting: Internal Medicine

## 2012-02-06 ENCOUNTER — Other Ambulatory Visit: Payer: Self-pay | Admitting: Internal Medicine

## 2012-02-06 MED ORDER — METOPROLOL SUCCINATE ER 100 MG PO TB24: 100.0000 mg | ORAL_TABLET | Freq: Every day | ORAL | Status: DC

## 2012-03-13 ENCOUNTER — Encounter: Payer: Medicare Other | Admitting: *Deleted

## 2012-03-18 ENCOUNTER — Encounter: Payer: Self-pay | Admitting: *Deleted

## 2012-05-19 ENCOUNTER — Other Ambulatory Visit: Payer: Self-pay | Admitting: *Deleted

## 2012-05-19 MED ORDER — AMLODIPINE BESYLATE 10 MG PO TABS: 10.0000 mg | ORAL_TABLET | Freq: Every day | ORAL | Status: DC

## 2012-07-31 ENCOUNTER — Encounter: Payer: Self-pay | Admitting: *Deleted

## 2012-12-11 ENCOUNTER — Encounter: Payer: Self-pay | Admitting: Internal Medicine

## 2012-12-11 ENCOUNTER — Ambulatory Visit (INDEPENDENT_AMBULATORY_CARE_PROVIDER_SITE_OTHER): Payer: Medicare Other | Admitting: Internal Medicine

## 2012-12-11 VITALS — BP 126/78 | HR 89 | Ht 62.0 in | Wt 124.0 lb

## 2012-12-11 DIAGNOSIS — I421 Obstructive hypertrophic cardiomyopathy: Secondary | ICD-10-CM

## 2012-12-11 DIAGNOSIS — I442 Atrioventricular block, complete: Secondary | ICD-10-CM

## 2012-12-11 DIAGNOSIS — R609 Edema, unspecified: Secondary | ICD-10-CM

## 2012-12-11 DIAGNOSIS — I10 Essential (primary) hypertension: Secondary | ICD-10-CM

## 2012-12-11 LAB — BASIC METABOLIC PANEL
BUN: 16 mg/dL (ref 6–23)
Chloride: 111 mEq/L (ref 96–112)
Creatinine, Ser: 0.9 mg/dL (ref 0.4–1.2)
GFR: 79.66 mL/min (ref >60.00)
Potassium: 3.9 mEq/L (ref 3.5–5.1)

## 2012-12-11 MED ORDER — AMLODIPINE BESYLATE 10 MG PO TABS: 5.0000 mg | ORAL_TABLET | Freq: Every day | ORAL | Status: DC

## 2012-12-11 NOTE — Patient Instructions (Addendum)
Your physician wants you to follow-up in: 12 months with Dr [name] will receive a reminder letter in the mail two months in advance. If you don't receive a letter, please call our office to schedule the follow-up appointment.  Remote monitoring is used to monitor your Pacemaker of ICD from home. This monitoring reduces the number of office visits required to check your device to one time per year. It allows [place] to keep an eye on the functioning of your device to ensure it is working properly. You are scheduled for a device check from home on 03/16/13. You may send your transmission at any time that day. If you have a wireless device, the transmission will be sent automatically. After your physician reviews your transmission, you will receive a postcard with your next transmission date.   Your physician recommends that you have labs today: BMP  Your physician has requested that you have an echocardiogram. Echocardiography is a painless test that uses sound waves to create images of your heart. It provides your doctor with information about the size and shape of your heart and how well your heart's chambers and valves are working. This procedure takes approximately one hour. There are no restrictions for this procedure.   Your physician has recommended you make the following change in your medication:  1) Decrease Amlodipine to 5mg  daily

## 2012-12-11 NOTE — Progress Notes (Signed)
PCP: Gildardo Cranker, DO  [name] is a 72 y.o. female who presents today for routine electrophysiology followup.  Since last being seen in our clinic, the patient reports doing very well. She has noticed some edema.  Today, she denies symptoms of palpitations, chest pain, shortness of breath, dizziness, presyncope, or syncope.  The patient is otherwise without complaint today.   Past Medical History  Diagnosis Date  . History of Guillain-Barre syndrome     9/11  . Diverticulosis 11/2005    colon, history  . Gastrointestinal hemorrhage 08/2005    Not felt to be a coumadin or ASA candidate  . Peptic ulcer disease 08/2005  . Paroxysmal atrial fibrillation     prior GI bleeding- not felt to be a candidate for ASA or coumadin  . Hypertension   . Atrioventricular block, complete     s/p PPM  . Hypertrophic obstructive cardiomyopathy   . Diastolic dysfunction 10/12/2009  . UGI bleed 04/2009  . Blood transfusion abn reaction or complication, no procedure mishap    Past Surgical History  Procedure Laterality Date  . Vagotomy  1995  . Cardiac pacemaker placement  2003/2004    , most recent generator change 5/12, MDT  . Fetal blood transfusion      Current Outpatient Prescriptions  Medication Sig Dispense Refill  . amLODipine (NORVASC) 10 MG tablet Take 1 tablet (10 mg total) by mouth daily.  90 tablet  3  . metoprolol succinate (TOPROL-XL) 100 MG 24 hr tablet Take 1 tablet (100 mg total) by mouth daily.  30 tablet  8  . Multiple Vitamin (MULTIVITAMIN) capsule Take 1 capsule by mouth daily.         No current facility-administered medications for this visit.    Physical Exam: Filed Vitals:   12/11/12 0916  BP: 126/78  Pulse: 89  Height: 5\' 2"  (1.575 m)  Weight: 124 lb (56.246 kg)  SpO2: 97%    GEN- The patient is well appearing, alert and oriented x 3 today.   Head- normocephalic, atraumatic Eyes-  Sclera clear, conjunctiva pink Ears- hearing intact Oropharynx-  clear Lungs- Clear to ausculation bilaterally, normal work of breathing Chest- pacemaker pocket is well healed Heart- Regular rate and rhythm,2/6 SEM LUSB GI- soft, NT, ND, + BS Extremities- no clubbing, cyanosis, +1 edema  Pacemaker interrogation- reviewed in detail today,  See PACEART report  Assessment and Plan:  1. Complete heart block V output reprogrammed unipolar 2.5V @1 .  (threshold today 1.25V@1msec ) Reprogrammed VVIR due to permanent afib  2. Permanent afib Not felt to be a candidate for anticoagulation due to prior GI bleeding Could consider eliquis in the future  3. Edema Check bmet and echo Decrease norvasc which may be contributing 2 gram sodium diet Consider low dose diuretic if not improved  4. HCM Stable  5. HTN Reduce norvasc as above  Return in 1 year [facility] every 3 months

## 2012-12-23 ENCOUNTER — Ambulatory Visit (HOSPITAL_COMMUNITY): Payer: Medicare Other | Attending: Internal Medicine | Admitting: Radiology

## 2012-12-23 DIAGNOSIS — I359 Nonrheumatic aortic valve disorder, unspecified: Secondary | ICD-10-CM

## 2012-12-23 DIAGNOSIS — R609 Edema, unspecified: Secondary | ICD-10-CM | POA: Insufficient documentation

## 2012-12-23 NOTE — Progress Notes (Signed)
Echocardiogram performed.  

## 2013-01-01 LAB — PACEMAKER DEVICE OBSERVATION
BAMS-0001: 150 {beats}/min
BATTERY VOLTAGE: 2.76 V
BRDY-0002RV: 60 {beats}/min
BRDY-0003RV: 120 {beats}/min
BRDY-0004RV: 120 {beats}/min
RV LEAD THRESHOLD: 1.25 V

## 2013-01-02 ENCOUNTER — Other Ambulatory Visit: Payer: Self-pay | Admitting: Emergency Medicine

## 2013-01-02 MED ORDER — METOPROLOL SUCCINATE ER 100 MG PO TB24: 100.0000 mg | ORAL_TABLET | Freq: Every day | ORAL | Status: DC

## 2013-02-24 ENCOUNTER — Other Ambulatory Visit: Payer: Self-pay | Admitting: *Deleted

## 2013-02-24 DIAGNOSIS — R609 Edema, unspecified: Secondary | ICD-10-CM

## 2013-02-24 MED ORDER — AMLODIPINE BESYLATE 5 MG PO TABS: 5.0000 mg | ORAL_TABLET | Freq: Every day | ORAL | Status: DC

## 2013-03-16 ENCOUNTER — Ambulatory Visit (INDEPENDENT_AMBULATORY_CARE_PROVIDER_SITE_OTHER): Payer: Medicare Other | Admitting: *Deleted

## 2013-03-16 DIAGNOSIS — I442 Atrioventricular block, complete: Secondary | ICD-10-CM

## 2013-03-16 DIAGNOSIS — Z950 Presence of cardiac pacemaker: Secondary | ICD-10-CM

## 2013-03-27 LAB — REMOTE PACEMAKER DEVICE
BMOD-0003RV: 30
BRDY-0002RV: 60 {beats}/min
VENTRICULAR PACING PM: 99

## 2013-03-30 ENCOUNTER — Encounter: Payer: Self-pay | Admitting: *Deleted

## 2013-04-03 NOTE — Progress Notes (Signed)
MDT ppm remote check. All functions normal, full details in PaceArt.  [facility] 06/22/13 & ROV w/ Dr. Johney Frame [id number].

## 2013-04-08 ENCOUNTER — Encounter: Payer: Self-pay | Admitting: Internal Medicine

## 2013-06-22 ENCOUNTER — Ambulatory Visit (INDEPENDENT_AMBULATORY_CARE_PROVIDER_SITE_OTHER): Payer: Medicare Other | Admitting: *Deleted

## 2013-06-22 DIAGNOSIS — I442 Atrioventricular block, complete: Secondary | ICD-10-CM

## 2013-06-22 LAB — MDC_IDC_ENUM_SESS_TYPE_REMOTE
Battery Impedance: 158 Ohm
Brady Statistic RV Percent Paced: 99 %
Lead Channel Impedance Value: 67 Ohm
Lead Channel Pacing Threshold Pulse Width: 0.4 ms
Lead Channel Setting Pacing Amplitude: 2.5 V
Lead Channel Setting Sensing Sensitivity: 2.8 mV

## 2013-07-07 ENCOUNTER — Encounter: Payer: Self-pay | Admitting: *Deleted

## 2013-07-15 ENCOUNTER — Encounter: Payer: Self-pay | Admitting: Internal Medicine

## 2013-08-21 ENCOUNTER — Encounter (HOSPITAL_COMMUNITY): Payer: Self-pay | Admitting: Emergency Medicine

## 2013-08-21 ENCOUNTER — Inpatient Hospital Stay (HOSPITAL_COMMUNITY)
Admission: EM | Admit: 2013-08-21 | Discharge: 2013-08-24 | DRG: 378 | Disposition: A | Discharge status: Home / Self Care | Payer: Medicare Other | Attending: Family Medicine | Admitting: Family Medicine

## 2013-08-21 ENCOUNTER — Emergency Department (HOSPITAL_COMMUNITY): Payer: Medicare Other

## 2013-08-21 DIAGNOSIS — D599 Acquired hemolytic anemia, unspecified: Secondary | ICD-10-CM

## 2013-08-21 DIAGNOSIS — Q254 Other congenital malformations of aorta: Secondary | ICD-10-CM

## 2013-08-21 DIAGNOSIS — I428 Other cardiomyopathies: Secondary | ICD-10-CM

## 2013-08-21 DIAGNOSIS — K922 Gastrointestinal hemorrhage, unspecified: Secondary | ICD-10-CM

## 2013-08-21 DIAGNOSIS — R197 Diarrhea, unspecified: Secondary | ICD-10-CM | POA: Diagnosis present

## 2013-08-21 DIAGNOSIS — I10 Essential (primary) hypertension: Secondary | ICD-10-CM

## 2013-08-21 DIAGNOSIS — K219 Gastro-esophageal reflux disease without esophagitis: Secondary | ICD-10-CM

## 2013-08-21 DIAGNOSIS — Z8249 Family history of ischemic heart disease and other diseases of the circulatory system: Secondary | ICD-10-CM

## 2013-08-21 DIAGNOSIS — Z803 Family history of malignant neoplasm of breast: Secondary | ICD-10-CM

## 2013-08-21 DIAGNOSIS — E872 Acidosis, unspecified: Secondary | ICD-10-CM | POA: Diagnosis present

## 2013-08-21 DIAGNOSIS — E876 Hypokalemia: Secondary | ICD-10-CM | POA: Diagnosis present

## 2013-08-21 DIAGNOSIS — Z823 Family history of stroke: Secondary | ICD-10-CM

## 2013-08-21 DIAGNOSIS — Z8711 Personal history of peptic ulcer disease: Secondary | ICD-10-CM

## 2013-08-21 DIAGNOSIS — Z950 Presence of cardiac pacemaker: Secondary | ICD-10-CM

## 2013-08-21 DIAGNOSIS — M109 Gout, unspecified: Secondary | ICD-10-CM | POA: Diagnosis present

## 2013-08-21 DIAGNOSIS — I4821 Permanent atrial fibrillation: Secondary | ICD-10-CM

## 2013-08-21 DIAGNOSIS — I442 Atrioventricular block, complete: Secondary | ICD-10-CM

## 2013-08-21 DIAGNOSIS — I4891 Unspecified atrial fibrillation: Secondary | ICD-10-CM | POA: Diagnosis present

## 2013-08-21 DIAGNOSIS — D509 Iron deficiency anemia, unspecified: Secondary | ICD-10-CM

## 2013-08-21 DIAGNOSIS — D62 Acute posthemorrhagic anemia: Secondary | ICD-10-CM | POA: Diagnosis present

## 2013-08-21 DIAGNOSIS — K5731 Diverticulosis of large intestine without perforation or abscess with bleeding: Principal | ICD-10-CM | POA: Diagnosis present

## 2013-08-21 DIAGNOSIS — D72829 Elevated white blood cell count, unspecified: Secondary | ICD-10-CM | POA: Diagnosis present

## 2013-08-21 DIAGNOSIS — Z836 Family history of other diseases of the respiratory system: Secondary | ICD-10-CM

## 2013-08-21 DIAGNOSIS — I421 Obstructive hypertrophic cardiomyopathy: Secondary | ICD-10-CM

## 2013-08-21 DIAGNOSIS — R634 Abnormal weight loss: Secondary | ICD-10-CM

## 2013-08-21 LAB — CG4 I-STAT (LACTIC ACID): Lactic Acid, Venous: 3.42 mmol/L — ABNORMAL HIGH (ref 0.5–2.2)

## 2013-08-21 NOTE — ED Provider Notes (Signed)
CSN: [id number]     Arrival date & time 08/21/13  2232 History   First MD Initiated Contact with Patient 08/21/13 2255     Chief Complaint  Patient presents with  . Rectal Bleeding   (Consider location/radiation/quality/duration/timing/severity/associated sxs/prior Treatment) HPI Comments: Patient presents to the ER for evaluation of rectal bleeding. Patient reports that she had 2 episodes of dark red blood per rectum tonight. She has not had any abdominal pain or rectal pain. Patient denies weakness, dizziness, or palpitations, chest pain. The patient reports that she passed blood without stool. No clots.  Patient is a 73 y.o. female presenting with hematochezia.  Rectal Bleeding Associated symptoms: no abdominal pain and no fever     Past Medical History  Diagnosis Date  . History of Guillain-Barre syndrome     9/11  . Diverticulosis 11/2005    colon, history  . Gastrointestinal hemorrhage 08/2005    Not felt to be a coumadin or ASA candidate  . Peptic ulcer disease 08/2005  . Paroxysmal atrial fibrillation     prior GI bleeding- not felt to be a candidate for ASA or coumadin  . Hypertension   . Atrioventricular block, complete     s/p PPM  . Hypertrophic obstructive cardiomyopathy   . Diastolic dysfunction 8/84/1660  . UGI bleed 04/2009  . Blood transfusion abn reaction or complication, no procedure mishap    Past Surgical History  Procedure Laterality Date  . Vagotomy  1995  . Cardiac pacemaker placement  2003/2004    , most recent generator change 5/12, MDT  . Fetal blood transfusion     Family History  Problem Relation Age of Onset  . Transient ischemic attack Father   . COPD Father   . Hypertension Daughter   . Breast cancer Sister    History  Substance Use Topics  . Smoking status: Never Smoker   . Smokeless tobacco: Not on file  . Alcohol Use: No   OB History   Grav Para Term Preterm Abortions TAB SAB Ect Mult Living                 Review of Systems   Constitutional: Negative for fever.  Respiratory: Negative.   Cardiovascular: Negative.   Gastrointestinal: Positive for hematochezia and anal bleeding. Negative for abdominal pain.  All other systems reviewed and are negative.    Allergies  Aspirin and Orphenadrine citrate  Home Medications   Current Outpatient Rx  Name  Route  Sig  Dispense  Refill  . amLODipine (NORVASC) 5 MG tablet   Oral   Take 1 tablet (5 mg total) by mouth daily.   90 tablet   1     Medication Decrease per Dr Rayann Heman. Patient prefers ...   . metoprolol succinate (TOPROL-XL) 100 MG 24 hr tablet   Oral   Take 1 tablet (100 mg total) by mouth daily.   90 tablet   3   . Multiple Vitamin (MULTIVITAMIN) capsule   Oral   Take 1 capsule by mouth daily.            BP 92/66  Pulse 64  Temp(Src) 98.4 F (36.9 C) (Oral)  Resp 20  SpO2 100% Physical Exam  Constitutional: She is oriented to person, place, and time. She appears well-developed and well-nourished. No distress.  HENT:  Head: Normocephalic and atraumatic.  Right Ear: Hearing normal.  Left Ear: Hearing normal.  Nose: Nose normal.  Mouth/Throat: Oropharynx is clear and moist and mucous membranes  are normal.  Eyes: Conjunctivae and EOM are normal. Pupils are equal, round, and reactive to light.  Neck: Normal range of motion. Neck supple.  Cardiovascular: Regular rhythm, S1 normal and S2 normal.  Exam reveals no gallop and no friction rub.   No murmur heard. Pulmonary/Chest: Effort normal and breath sounds normal. No respiratory distress. She exhibits no tenderness.  Abdominal: Soft. Normal appearance and bowel sounds are normal. There is no hepatosplenomegaly. There is no tenderness. There is no rebound, no guarding, no tenderness at McBurney's point and negative Murphy's sign. No hernia.  Musculoskeletal: Normal range of motion.  Neurological: She is alert and oriented to person, place, and time. She has normal strength. No cranial nerve  deficit or sensory deficit. Coordination normal. GCS eye subscore is 4. GCS verbal subscore is 5. GCS motor subscore is 6.  Skin: Skin is warm, dry and intact. No rash noted. No cyanosis.  Psychiatric: She has a normal mood and affect. Her speech is normal and behavior is normal. Thought content normal.    ED Course  Procedures (including critical care time) Labs Review Labs Reviewed  COMPREHENSIVE METABOLIC PANEL - Abnormal; Notable for the following:    CO2 17 (*)    Glucose, Bld 137 (*)    BUN 57 (*)    Total Protein 5.5 (*)    Albumin 2.8 (*)    GFR calc non Af Amer 53 (*)    GFR calc Af Amer 62 (*)    All other components within normal limits  CBC WITH DIFFERENTIAL - Abnormal; Notable for the following:    WBC 12.2 (*)    RBC 2.96 (*)    Hemoglobin 8.6 (*)    HCT 26.7 (*)    Neutrophils Relative % 85 (*)    Neutro Abs 10.3 (*)    Lymphocytes Relative 10 (*)    All other components within normal limits  CG4 I-STAT (LACTIC ACID) - Abnormal; Notable for the following:    Lactic Acid, Venous 3.42 (*)    All other components within normal limits  OCCULT BLOOD, POC DEVICE - Abnormal; Notable for the following:    Fecal Occult Bld POSITIVE (*)    All other components within normal limits  TROPONIN I  CBC WITH DIFFERENTIAL  URINALYSIS, ROUTINE W REFLEX MICROSCOPIC  TYPE AND SCREEN   Imaging Review Ct Abdomen Pelvis W Contrast  08/22/2013   CLINICAL DATA:  Rectal bleeding.  Lactic acidosis.  EXAM: CT ABDOMEN AND PELVIS WITH CONTRAST  TECHNIQUE: Multidetector CT imaging of the abdomen and pelvis was performed using the standard protocol following bolus administration of intravenous contrast.  CONTRAST:  154mL OMNIPAQUE IOHEXOL 300 MG/ML  SOLN  COMPARISON:  04/05/2010  FINDINGS: BODY WALL: Upper abdominal midline hernias, at least 2, containing omental fat.  LOWER CHEST: Cardiomegaly, with atrial dilatation predominating.  ABDOMEN/PELVIS:  Liver: No focal abnormality.  Biliary: No  evidence of biliary obstruction or stone.  Pancreas: Unremarkable.  Spleen: Unremarkable.  Adrenals: Unremarkable.  Kidneys and ureters: Bilateral cortical cysts, the largest in the lower pole right kidney measuring 2.8 cm. No hydronephrosis.  Bladder: Unremarkable.  Reproductive: Coarsely calcified uterine fibroids, with the largest in the right uterine body measuring 8 cm.  Bowel: Extensive colonic diverticulosis. No evidence of colitis or diverticulitis. No bowel obstruction. Normal appendix. Surgical clips noted near the GE junction and along the posterior uterine body.  Retroperitoneum: No mass or adenopathy.  Peritoneum: No free fluid or gas.  Vascular: No acute abnormality.  There is aortic and branch vessel atherosclerosis. No evidence of acute mesenteric occlusion to explain lactic acidosis.  OSSEOUS: No acute abnormalities. L4-5 anterolisthesis, grade 1, related to advanced facet osteoarthritis.  IMPRESSION: 1. No acute intra-abdominal findings. 2. Colonic diverticulosis. 3. Chronic findings are stable from 2011, noted above.   Electronically Signed   By: Jorje Guild M.D.   On: 08/22/2013 04:24   Dg Abd Acute W/chest  08/22/2013   CLINICAL DATA:  Rectal bleeding and history of diverticulosis  EXAM: ACUTE ABDOMEN SERIES (ABDOMEN 2 VIEW & CHEST 1 VIEW)  COMPARISON:  12/08/2010 chest x-ray  FINDINGS: Chronic cardiopericardial enlargement. Right approach dual-chamber pacer wires have been unchanged orientation. No infiltrate or edema. There is mild blunting of the lateral left costophrenic sulcus, not seen on abdominal imaging.  Mild S-shaped scoliosis of the thoracolumbar spine.  Nonobstructive bowel gas pattern. Postsurgical changes of the GE junction. Coarse calcifications in the pelvis, consistent with calcified uterine fibroid based on previous CT imaging - at least 8 cm in diameter. No pneumoperitoneum. No acute osseous findings.  IMPRESSION: Negative abdominal radiographs.  No acute  cardiopulmonary disease.   Electronically Signed   By: Jorje Guild M.D.   On: 08/22/2013 00:27    EKG Interpretation    Date/Time:  Friday August 21 2013 23:31:56 EST Ventricular Rate:  61 PR Interval:    QRS Duration: 143 QT Interval:  457 QTC Calculation: 460 R Axis:   -84 Text Interpretation:  VENTRICULAR PACED RHYTHM No significant change since last tracing Confirmed by Afreen Siebels  MD, [place] ([id number]) on 08/21/2013 11:38:50 PM            MDM  Diagnosis: GI bleed  Patient presents to the ER for evaluation of GI bleed. Patient reports that she noticed dark red blood in her stool twice this evening. She does have history of upper GI bleed. Patient is not experiencing any abdominal pain she had a benign abdominal exam. She did, however, have a gap acidosis secondary to lactic acidosis. Cause of this, CAT scan was performed to evaluate for possible ischemic bowel. CAT scan was unremarkable.  Patient's hemoglobin is 8.6. It appears that her baseline is around 12. This appears to be a drop and therefore will require hospitalization for further monitoring. Presumably it is unclear. She did have an elevated BUN with normal creatinine which raises suspicion for upper GI bleed. She is passing blood, although dark, blood from her rectum. This could be diverticular bleed, but brisk upper GI blled can do this as well.  Patient will be admitted to the hospital for further management.  Orpah Greek, MD 08/22/13 [phone]

## 2013-08-21 NOTE — ED Notes (Signed)
Phlebotomy at bedside.

## 2013-08-21 NOTE — ED Notes (Signed)
Patient presents to ED via [facility]. Patient states, "I noticed dark red blood in my stool when I went to the bathroom tonight." Denies any abdominal pain, or pain/burning with urination. No tenderness upon palpation of abdomen. Pt states that she has a hx of "ulcers many years ago." No other complaints at this time. A&Ox4. No acute distress noted at this time.

## 2013-08-22 ENCOUNTER — Emergency Department (HOSPITAL_COMMUNITY): Payer: Medicare Other

## 2013-08-22 DIAGNOSIS — D509 Iron deficiency anemia, unspecified: Secondary | ICD-10-CM

## 2013-08-22 DIAGNOSIS — I442 Atrioventricular block, complete: Secondary | ICD-10-CM

## 2013-08-22 DIAGNOSIS — I428 Other cardiomyopathies: Secondary | ICD-10-CM

## 2013-08-22 DIAGNOSIS — Q254 Other congenital malformations of aorta: Secondary | ICD-10-CM

## 2013-08-22 DIAGNOSIS — K922 Gastrointestinal hemorrhage, unspecified: Secondary | ICD-10-CM

## 2013-08-22 DIAGNOSIS — I421 Obstructive hypertrophic cardiomyopathy: Secondary | ICD-10-CM

## 2013-08-22 DIAGNOSIS — Z950 Presence of cardiac pacemaker: Secondary | ICD-10-CM

## 2013-08-22 LAB — CBC WITH DIFFERENTIAL/PLATELET
BASOS PCT: 0 % (ref 0–1)
Basophils Absolute: 0 10*3/uL (ref 0.0–0.1)
EOS PCT: 0 % (ref 0–5)
Eosinophils Absolute: 0 10*3/uL (ref 0.0–0.7)
HEMATOCRIT: 26.7 % — AB (ref 36.0–46.0)
Hemoglobin: 8.6 g/dL — ABNORMAL LOW (ref 12.0–15.0)
Lymphocytes Relative: 10 % — ABNORMAL LOW (ref 12–46)
Lymphs Abs: 1.2 10*3/uL (ref 0.7–4.0)
MCH: 29.1 pg (ref 26.0–34.0)
MCHC: 32.2 g/dL (ref 30.0–36.0)
MCV: 90.2 fL (ref 78.0–100.0)
MONO ABS: 0.6 10*3/uL (ref 0.1–1.0)
MONOS PCT: 5 % (ref 3–12)
Neutro Abs: 10.3 10*3/uL — ABNORMAL HIGH (ref 1.7–7.7)
Neutrophils Relative %: 85 % — ABNORMAL HIGH (ref 43–77)
Platelets: 243 10*3/uL (ref 150–400)
RBC: 2.96 MIL/uL — ABNORMAL LOW (ref 3.87–5.11)
RDW: 14.3 % (ref 11.5–15.5)
WBC: 12.2 10*3/uL — ABNORMAL HIGH (ref 4.0–10.5)

## 2013-08-22 LAB — URINE MICROSCOPIC-ADD ON

## 2013-08-22 LAB — COMPREHENSIVE METABOLIC PANEL
ALK PHOS: 58 U/L (ref 39–117)
ALT: 9 U/L (ref 0–35)
AST: 16 U/L (ref 0–37)
Albumin: 2.8 g/dL — ABNORMAL LOW (ref 3.5–5.2)
BUN: 57 mg/dL — ABNORMAL HIGH (ref 6–23)
CALCIUM: 8.5 mg/dL (ref 8.4–10.5)
CO2: 17 mEq/L — ABNORMAL LOW (ref 19–32)
Chloride: 110 mEq/L (ref 96–112)
Creatinine, Ser: 1.01 mg/dL (ref 0.50–1.10)
GFR calc Af Amer: 62 mL/min — ABNORMAL LOW (ref >90)
GFR, EST NON AFRICAN AMERICAN: 53 mL/min — AB (ref >90)
Glucose, Bld: 137 mg/dL — ABNORMAL HIGH (ref 70–99)
Potassium: 4.3 mEq/L (ref 3.7–5.3)
SODIUM: 142 meq/L (ref 137–147)
Total Bilirubin: 0.5 mg/dL (ref 0.3–1.2)
Total Protein: 5.5 g/dL — ABNORMAL LOW (ref 6.0–8.3)

## 2013-08-22 LAB — CBC
HCT: 23.5 % — ABNORMAL LOW (ref 36.0–46.0)
Hemoglobin: 7.6 g/dL — ABNORMAL LOW (ref 12.0–15.0)
MCH: 28.9 pg (ref 26.0–34.0)
MCHC: 32.3 g/dL (ref 30.0–36.0)
MCV: 89.4 fL (ref 78.0–100.0)
PLATELETS: 228 10*3/uL (ref 150–400)
RBC: 2.63 MIL/uL — AB (ref 3.87–5.11)
RDW: 14.2 % (ref 11.5–15.5)
WBC: 12.8 10*3/uL — AB (ref 4.0–10.5)

## 2013-08-22 LAB — POCT I-STAT, CHEM 8
BUN: 41 mg/dL — ABNORMAL HIGH (ref 6–23)
CALCIUM ION: 1.17 mmol/L (ref 1.13–1.30)
Chloride: 111 mEq/L (ref 96–112)
Creatinine, Ser: 1 mg/dL (ref 0.50–1.10)
Glucose, Bld: 88 mg/dL (ref 70–99)
HCT: 24 % — ABNORMAL LOW (ref 36.0–46.0)
HEMOGLOBIN: 8.2 g/dL — AB (ref 12.0–15.0)
Potassium: 4 mEq/L (ref 3.7–5.3)
SODIUM: 143 meq/L (ref 137–147)
TCO2: 17 mmol/L (ref 0–100)

## 2013-08-22 LAB — URINALYSIS, ROUTINE W REFLEX MICROSCOPIC
BILIRUBIN URINE: NEGATIVE
Glucose, UA: NEGATIVE mg/dL
KETONES UR: NEGATIVE mg/dL
Nitrite: NEGATIVE
Protein, ur: NEGATIVE mg/dL
Specific Gravity, Urine: 1.01 (ref 1.005–1.030)
UROBILINOGEN UA: 0.2 mg/dL (ref 0.0–1.0)
pH: 5 (ref 5.0–8.0)

## 2013-08-22 LAB — TROPONIN I: Troponin I: <0.3 ng/mL (ref <0.30)

## 2013-08-22 LAB — MRSA PCR SCREENING: MRSA BY PCR: POSITIVE — AB

## 2013-08-22 LAB — OCCULT BLOOD, POC DEVICE: Fecal Occult Bld: POSITIVE — AB

## 2013-08-22 LAB — PROTIME-INR
INR: 1.28 (ref 0.00–1.49)
PROTHROMBIN TIME: 15.7 s — AB (ref 11.6–15.2)

## 2013-08-22 LAB — PREPARE RBC (CROSSMATCH)

## 2013-08-22 MED ORDER — SODIUM CHLORIDE 0.9 % IV BOLUS (SEPSIS)
500.0000 mL | Freq: Once | INTRAVENOUS | Status: AC
  Administered 2013-08-22: 500 mL via INTRAVENOUS

## 2013-08-22 MED ORDER — CHLORHEXIDINE GLUCONATE CLOTH 2 % EX PADS
6.0000 | MEDICATED_PAD | Freq: Every day | CUTANEOUS | Status: DC
  Administered 2013-08-23 – 2013-08-24 (×2): 6 via TOPICAL

## 2013-08-22 MED ORDER — IOHEXOL 300 MG/ML  SOLN
25.0000 mL | Freq: Once | INTRAMUSCULAR | Status: AC | PRN
  Administered 2013-08-22: 25 mL via ORAL

## 2013-08-22 MED ORDER — ONDANSETRON HCL 4 MG PO TABS: 4.0000 mg | ORAL_TABLET | Freq: Four times a day (QID) | ORAL | Status: DC | PRN

## 2013-08-22 MED ORDER — SODIUM CHLORIDE 0.9 % IV BOLUS (SEPSIS)
1000.0000 mL | Freq: Once | INTRAVENOUS | Status: AC
  Administered 2013-08-22: 1000 mL via INTRAVENOUS

## 2013-08-22 MED ORDER — PANTOPRAZOLE SODIUM 40 MG IV SOLR
40.0000 mg | Freq: Once | INTRAVENOUS | Status: AC
  Administered 2013-08-22: 40 mg via INTRAVENOUS
  Filled 2013-08-22: qty 40

## 2013-08-22 MED ORDER — SODIUM CHLORIDE 0.9 % IV SOLN
INTRAVENOUS | Status: AC
  Administered 2013-08-22: 18:00:00 via INTRAVENOUS

## 2013-08-22 MED ORDER — PANTOPRAZOLE SODIUM 40 MG IV SOLR
40.0000 mg | INTRAVENOUS | Status: DC
  Administered 2013-08-22: 40 mg via INTRAVENOUS
  Filled 2013-08-22 (×2): qty 40

## 2013-08-22 MED ORDER — PIPERACILLIN-TAZOBACTAM 3.375 G IVPB
3.3750 g | Freq: Three times a day (TID) | INTRAVENOUS | Status: DC
  Administered 2013-08-22 – 2013-08-23 (×3): 3.375 g via INTRAVENOUS
  Filled 2013-08-22 (×5): qty 50

## 2013-08-22 MED ORDER — SODIUM CHLORIDE 0.9 % IJ SOLN
3.0000 mL | Freq: Two times a day (BID) | INTRAMUSCULAR | Status: DC
  Administered 2013-08-22 – 2013-08-23 (×3): 3 mL via INTRAVENOUS

## 2013-08-22 MED ORDER — ACETAMINOPHEN 325 MG PO TABS: 650.0000 mg | ORAL_TABLET | Freq: Four times a day (QID) | ORAL | Status: DC | PRN

## 2013-08-22 MED ORDER — ONDANSETRON HCL 4 MG/2ML IJ SOLN: 4.0000 mg | Freq: Four times a day (QID) | INTRAMUSCULAR | Status: DC | PRN

## 2013-08-22 MED ORDER — ACETAMINOPHEN 650 MG RE SUPP
650.0000 mg | Freq: Once | RECTAL | Status: DC
  Filled 2013-08-22: qty 1

## 2013-08-22 MED ORDER — ACETAMINOPHEN 650 MG RE SUPP: 650.0000 mg | Freq: Four times a day (QID) | RECTAL | Status: DC | PRN

## 2013-08-22 MED ORDER — IOHEXOL 300 MG/ML  SOLN
100.0000 mL | Freq: Once | INTRAMUSCULAR | Status: AC | PRN
  Administered 2013-08-22: 100 mL via INTRAVENOUS

## 2013-08-22 MED ORDER — MUPIROCIN 2 % EX OINT
1.0000 "application " | TOPICAL_OINTMENT | Freq: Two times a day (BID) | CUTANEOUS | Status: DC
  Administered 2013-08-22 – 2013-08-24 (×4): 1 via NASAL
  Filled 2013-08-22: qty 22

## 2013-08-22 MED ORDER — PIPERACILLIN-TAZOBACTAM 3.375 G IVPB 30 MIN
3.3750 g | Freq: Once | INTRAVENOUS | Status: AC
  Administered 2013-08-22: 3.375 g via INTRAVENOUS
  Filled 2013-08-22: qty 50

## 2013-08-22 NOTE — H&P (Signed)
[facility] Admission History and Physical Service Pager: [phone]  Patient name: [name] Medical record number: [id number] Date of birth: [birth year] Age: 73 y.o. Gender: female  Primary Care Provider: Kennith Maes, DO Consultants: None, GI in the am Code Status: Full  Chief Complaint: Bloody stools  Assessment and Plan: [name] is a 73 y.o. female presenting with 2-3 days of grossly bloody bowel movements concerning for lower GI bleed. 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 fib, PUD with GI bleed, diverticulosis, and hypertension.   Lower GI bleed - Few hemoglobins in the chart but her baseline appears to be 12, 8.6 today. - Will check CBCs now been every 8 hours x24 hours, transfusion threshold 7.0 (no apparent Hx of CAD) - No need for BID PPI as this appears to be lower GI bleed, will give daily with history of PUD - With soft blood pressures and BUN/creatinine ratio 57 I think she is volume depleted, admit to step down, IV hydration - Plan to consult GI this morning - appreciate recommendations  Lactic acidosis, leukocytosis - Lactate 3.42, bicarb 17, anion gap 15 - WBC 12.2 with left shift, afebrile - Unclear etiology,  - It's reassuring that her CT abdomen pelvis and abdominal exam is benign - For now will transfuse CBC and rehydrate, consider covering for microperforation of possible bleeding diverticula if her abdominal exam or clinical course worsens.  Complete AV block, HOCM, diastolic dysfunction, A. fib - status post pacemaker placement - Follows with Dr. Rayann Heman in clinic - Ventricular paced rhythm on EKG today - Echo 12/23/2012 with EF of 67%, diastolic dysfunction, no LVOT obstruction - Denies chest pain - Hold metoprolol and amlodipine tentatively well she has soft blood pressure - Will bolus times one more half liter, that's 1.5 L total, and plan to recheck CBC as she may warrant  transfusion - Not considered to be a good anticoagulation candidate with previous and current GI bleed  Hypertension - Soft blood pressures currently around 90/60 - Reviewed the chart reveals blood pressures generally [id number] systolic over 32I diastolic - Follow closely and restart antihypertensives when volume replete  Hx of Guillain-Barre syndrome - monitor, no acute therapies needed  FEN/GI: Has had 500 mL MS bolus x2 in the ED, additional 500 no bolus, normal diet, daily PPI Prophylaxis: SCDs, no heparin with active bleed  Disposition: Admit to step down unit for close observation, IV hydration, possible transfusions, and close monitoring.  History of Present Illness: [name] is a 73 y.o. female presenting with 2-3 days of grossly bloody bowel movements. She states that she remembers only having one bloody bowel movement today described as stool with blood in it. She denies any abdominal pain and reports normal by mouth intake. She states she last took her blood pressure medicine about 24 hours ago and that her blood pressure is not normally has low as it is currently. She denies smoking for the last 30 years and previous to that smoked possibly 1-3 cigarettes daily. She has a history of peptic ulcer disease and GI bleed presumably from that.  She was brought in today by her daughter who states that her sister found her sitting Lipitor left and "out of it". She reports that her mental status now is more normal and that she just seems sleepy.  Review Of Systems: Per HPI with the following additions: Otherwise 12 point review of systems was performed and was unremarkable.  Patient Active Problem  List   Diagnosis Date Noted  . Peripheral edema 12/11/2012  . Gout 12/07/2010  . WEIGHT LOSS, ABNORMAL 03/30/2010  . GUILLAIN-BARRE SYNDROME, HX OF 03/30/2010  . ACQUIRED HEMOLYTIC ANEMIA UNSPECIFIED 11/09/2009  . GENITAL PRURITUS 06/14/2009  . DERMATITIS 05/05/2009  . HYPERTENSION  12/22/2008  . Hypertrophic obstructive cardiomyopathy(425.11) 12/22/2008  . AV BLOCK, COMPLETE 12/22/2008  . Permanent atrial fibrillation 12/22/2008  . PALPITATIONS, HX OF 12/22/2008  . PEPTIC ULCER DISEASE, HX OF 12/22/2008  . Personal history of other diseases of digestive disease 12/22/2008  . PACEMAKER, PERMANENT 12/22/2008  . KNEE PAIN, BILATERAL 10/05/2008  . EPISTAXIS 07/08/2008  . DIZZINESS 10/01/2007  . ANEMIA, IRON DEFICIENCY, UNSPEC. 09/19/2006  . CARDIOMYOPATHY, IDIOPATHIC 09/19/2006  . GASTROESOPHAGEAL REFLUX, NO ESOPHAGITIS 09/19/2006  . OSTEOARTHRITIS, LOWER LEG 09/19/2006  . EFFUSION/SWELLING OF JOINT, UNSPECIFIED 09/19/2006  . BACK PAIN, LOW 09/19/2006  . AORTIC STENOSIS 09/19/2006   Past Medical History: Past Medical History  Diagnosis Date  . History of Guillain-Barre syndrome     9/11  . Diverticulosis 11/2005    colon, history  . Gastrointestinal hemorrhage 08/2005    Not felt to be a coumadin or ASA candidate  . Peptic ulcer disease 08/2005  . Paroxysmal atrial fibrillation     prior GI bleeding- not felt to be a candidate for ASA or coumadin  . Hypertension   . Atrioventricular block, complete     s/p PPM  . Hypertrophic obstructive cardiomyopathy   . Diastolic dysfunction [id number]  . UGI bleed 04/2009  . Blood transfusion abn reaction or complication, no procedure mishap    Past Surgical History: Past Surgical History  Procedure Laterality Date  . Vagotomy  1995  . Cardiac pacemaker placement  2003/2004    , most recent generator change 5/12, MDT  . Fetal blood transfusion     Social History: History  Substance Use Topics  . Smoking status: Never Smoker   . Smokeless tobacco: Not on file  . Alcohol Use: No   Additional social history: Please also refer to relevant sections of EMR.  Family History: Family History  Problem Relation Age of Onset  . Transient ischemic attack Father   . COPD Father   . Hypertension Daughter   . Breast  cancer Sister    Allergies and Medications: Allergies  Allergen Reactions  . Aspirin     REACTION: doesn't take due to trouble w/ ulcers  . Orphenadrine Citrate    No current facility-administered medications on file prior to encounter.   Current Outpatient Prescriptions on File Prior to Encounter  Medication Sig Dispense Refill  . amLODipine (NORVASC) 5 MG tablet Take 1 tablet (5 mg total) by mouth daily.  90 tablet  1  . metoprolol succinate (TOPROL-XL) 100 MG 24 hr tablet Take 1 tablet (100 mg total) by mouth daily.  90 tablet  3    Objective: BP 96/59  Pulse 61  Temp(Src) 96.8 F (36 C) (Rectal)  Resp 18  SpO2 100% Exam: Gen: NAD, alert, cooperative with exam HEENT: NCAT, EOMI, PERRL CV: RRR, good S1/S2, no murmur Resp: CTABL, no wheezes, non-labored Abd: SNTND, BS present, no guarding or organomegaly Ext: No edema, warm Neuro: Alert and oriented, strength 5/5 and sensation intact in all 4 extremity, conversational, normal speech   Labs and Imaging: CBC BMET   Recent Labs Lab 08/22/13 0034  WBC 12.2*  HGB 8.6*  HCT 26.7*  PLT 243    Recent Labs Lab 08/21/13 2304  NA 142  K 4.3  CL 110  CO2 17*  BUN 57*  CREATININE 1.01  GLUCOSE 137*  CALCIUM 8.5     Lactate 3.42 Troponin less than 0.30  Albumin 2.8  CT abd/pelvis 08/22/2013 IMPRESSION:  1. No acute intra-abdominal findings.  2. Colonic diverticulosis.  3. Chronic findings are stable from 2011, noted above.  DG acute abd/chest 08/22/2013 IMPRESSION:  Negative abdominal radiographs. No acute cardiopulmonary disease.  EKG 08/21/2013: Ventricular paced rhythm, no significant changes   Timmothy Euler, MD 08/22/2013, 5:48 AM PGY-2, [facility] Intern pager: [phone], text pages welcome

## 2013-08-22 NOTE — H&P (Signed)
[facility] Attending Admission Note: Thurlow Gallaga MD [phone] pager office [phone] I  have seen and examined this patient, reviewed their chart. I have discussed this patient with the resident. I agree with the resident's findings, assessment and care plan. 

## 2013-08-22 NOTE — ED Notes (Signed)
Pt asked if she could obtain a urine sample. Pt stated that she couldn't at the time. Will follow up shortly

## 2013-08-22 NOTE — ED Notes (Signed)
Pt still unable to obtain a urine sample.

## 2013-08-22 NOTE — Progress Notes (Addendum)
Interim note  Patient's blood pressure continues to slowly fall, she is now hovering in the 80s over 40s. Her UA is come back with moderate leukocytes but many squamous. Considering her UA, elevated lactate, and hypertension we'll go ahead and treat her for sepsis covering for urinary organisms and possible gut flora with Zosyn.  We'll also bolus her 1 additional liter (2.5 L total) and transfuse 2 units of blood. Her repeat CBC is still pending. I feel she is volume depleted based on her blood pressure and BUN/creatinine ratio 57.  Blood culture x2 before antibiotics  Discussed her case with the PA from [facility] called him for formal consult if her blood pressure is refractory to volume replacement.   She's alert and oriented to person, place, and president. She believes it's October 2008.  Laroy Apple, MD [place] Resident, PGY-2 08/22/2013, 8:57 AM

## 2013-08-22 NOTE — ED Notes (Addendum)
Pts BP 96/59 pt continues to be afebrile and asymptomatic. Family medicine paged. Family medicine returned page and stated to continue to monitor patient's V/S and that it would be ok to turn the blood up per protocol.

## 2013-08-22 NOTE — Progress Notes (Signed)
Dr. Nori Riis notified that pt's temp is 100.2 one hour into 2nd blood transfusion. Pt is asymptomatic for other s/s of blood transfusion reaction. Dr. Nori Riis wants Tylenol held at this point. Armandina Gemma, Kirby Crigler

## 2013-08-22 NOTE — ED Notes (Signed)
Pt finished with CT oral contrast. CT staff made aware.

## 2013-08-22 NOTE — ED Notes (Signed)
EDP made aware of patients BP and the rest of her v/s. Pt reports does not feel any different. EDP stated to continue the blood as long as the patient is afebrile and asymptomatic to run the blood at the same rate of 50 ml/hr and if the patients BP continues to drop to inform the admitting team. Pt resting quietly at this time resp e/u.

## 2013-08-22 NOTE — ED Notes (Signed)
Pt BP continuing to drop. Dr. Betsey Holiday EDP made aware. Admitting physician also paged.

## 2013-08-22 NOTE — Progress Notes (Signed)
ANTIBIOTIC CONSULT NOTE - INITIAL  Pharmacy Consult for Zosyn Indication: r/o UTI, GI coverage  Allergies  Allergen Reactions  . Aspirin     REACTION: doesn't take due to trouble w/ ulcers  . Orphenadrine Citrate     Patient Measurements:    Vital Signs: Temp: 96.8 F (36 C) (01/31 0208) Temp src: Rectal (01/31 0208) BP: 86/52 mmHg (01/31 0719) Pulse Rate: 60 (01/31 0721) Intake/Output from previous day:   Intake/Output from this shift:    Labs:  Recent Labs  08/21/13 2304 08/22/13 0034  WBC  --  12.2*  HGB  --  8.6*  PLT  --  243  CREATININE 1.01  --    The CrCl is unknown because both a height and weight (above a minimum accepted value) are required for this calculation. No results found for this basename: VANCOTROUGH, VANCOPEAK, VANCORANDOM, GENTTROUGH, GENTPEAK, GENTRANDOM, TOBRATROUGH, TOBRAPEAK, TOBRARND, AMIKACINPEAK, AMIKACINTROU, AMIKACIN,  in the last 72 hours   Microbiology: No results found for this or any previous visit (from the past 720 hour(s)).  Medical History: Past Medical History  Diagnosis Date  . History of Guillain-Barre syndrome     9/11  . Diverticulosis 11/2005    colon, history  . Gastrointestinal hemorrhage 08/2005    Not felt to be a coumadin or ASA candidate  . Peptic ulcer disease 08/2005  . Paroxysmal atrial fibrillation     prior GI bleeding- not felt to be a candidate for ASA or coumadin  . Hypertension   . Atrioventricular block, complete     s/p PPM  . Hypertrophic obstructive cardiomyopathy   . Diastolic dysfunction 11/23/5463  . UGI bleed 04/2009  . Blood transfusion abn reaction or complication, no procedure mishap     Medications:  Anti-infectives   None     Assessment: 73 year old female admitted with lower GI bleed.  She is to begin empiric antibiotic therapy with Zosyn for possible UTI and desired GI coverage.  Her renal function is within normal limits.  Plan:  Zosyn 3.375gm IV x 1 in ED, then q8h  extended infusion As no dosage adjustments are anticipated pharmacy will sign off.  Thank you for the consult.  Legrand Como, Pharm.D., BCPS, AAHIVP Clinical Pharmacist Phone: [phone] or [phone] 08/22/2013, 8:29 AM

## 2013-08-22 NOTE — Progress Notes (Signed)
FMTS Attending  Note: Dorcas Mcmurray MD [phone] pager office [phone] Notified increased temp, not febrile. In process of 2nd unit--will continue tnsf and monitor--I would wait to give tylenol now but follow.

## 2013-08-22 NOTE — Consult Note (Signed)
Consult for  GI  Reason for Consult: Hematochezia Referring Physician: Family Medicine  [name] HPI: This is a 73 year old female with multiple medical problems admitted for hematochezia.  The patient reports having several bouts of hematochezia and she was not herself.  In the past she had an EGD with Dr. Olevia Perches on 04/2009 with findings of an anastomotic ulcer.  She is s/p Bilroth I for PUD.  CT scan during this admission reveals colonic diverticula.  IV hydration has improved her symptoms.  There is a noted decline in her HGB from the 12 range down to the 8 range.    Past Medical History  Diagnosis Date  . History of Guillain-Barre syndrome     9/11  . Diverticulosis 11/2005    colon, history  . Gastrointestinal hemorrhage 08/2005    Not felt to be a coumadin or ASA candidate  . Peptic ulcer disease 08/2005  . Paroxysmal atrial fibrillation     prior GI bleeding- not felt to be a candidate for ASA or coumadin  . Hypertension   . Atrioventricular block, complete     s/p PPM  . Hypertrophic obstructive cardiomyopathy   . Diastolic dysfunction 2/95/1884  . UGI bleed 04/2009  . Blood transfusion abn reaction or complication, no procedure mishap     Past Surgical History  Procedure Laterality Date  . Vagotomy  1995  . Cardiac pacemaker placement  2003/2004    , most recent generator change 5/12, MDT  . Fetal blood transfusion      Family History  Problem Relation Age of Onset  . Transient ischemic attack Father   . COPD Father   . Hypertension Daughter   . Breast cancer Sister     Social History:  reports that she has never smoked. She does not have any smokeless tobacco history on file. She reports that she does not drink alcohol or use illicit drugs.  Allergies:  Allergies  Allergen Reactions  . Aspirin     REACTION: doesn't take due to trouble w/ ulcers  . Orphenadrine Citrate     Medications:  Scheduled: . [START ON 08/23/2013] pantoprazole  (PROTONIX) IV  40 mg Intravenous Q24H   Continuous: . piperacillin-tazobactam (ZOSYN)  IV      Results for orders placed during the hospital encounter of 08/21/13 (from the past 24 hour(s))  COMPREHENSIVE METABOLIC PANEL     Status: Abnormal   Collection Time    08/21/13 11:04 PM      Result Value Range   Sodium 142  137 - 147 mEq/L   Potassium 4.3  3.7 - 5.3 mEq/L   Chloride 110  96 - 112 mEq/L   CO2 17 (*) 19 - 32 mEq/L   Glucose, Bld 137 (*) 70 - 99 mg/dL   BUN 57 (*) 6 - 23 mg/dL   Creatinine, Ser 1.01  0.50 - 1.10 mg/dL   Calcium 8.5  8.4 - 10.5 mg/dL   Total Protein 5.5 (*) 6.0 - 8.3 g/dL   Albumin 2.8 (*) 3.5 - 5.2 g/dL   AST 16  0 - 37 U/L   ALT 9  0 - 35 U/L   Alkaline Phosphatase 58  39 - 117 U/L   Total Bilirubin 0.5  0.3 - 1.2 mg/dL   GFR calc non Af Amer 53 (*) >90 mL/min   GFR calc Af Amer 62 (*) >90 mL/min  TROPONIN I     Status: None   Collection Time  08/21/13 11:04 PM      Result Value Range   Troponin I <0.30  <0.30 ng/mL  CG4 I-STAT (LACTIC ACID)     Status: Abnormal   Collection Time    08/21/13 11:48 PM      Result Value Range   Lactic Acid, Venous 3.42 (*) 0.5 - 2.2 mmol/L  CBC WITH DIFFERENTIAL     Status: Abnormal   Collection Time    08/22/13 12:34 AM      Result Value Range   WBC 12.2 (*) 4.0 - 10.5 K/uL   RBC 2.96 (*) 3.87 - 5.11 MIL/uL   Hemoglobin 8.6 (*) 12.0 - 15.0 g/dL   HCT 26.7 (*) 36.0 - 46.0 %   MCV 90.2  78.0 - 100.0 fL   MCH 29.1  26.0 - 34.0 pg   MCHC 32.2  30.0 - 36.0 g/dL   RDW 14.3  11.5 - 15.5 %   Platelets 243  150 - 400 K/uL   Neutrophils Relative % 85 (*) 43 - 77 %   Neutro Abs 10.3 (*) 1.7 - 7.7 K/uL   Lymphocytes Relative 10 (*) 12 - 46 %   Lymphs Abs 1.2  0.7 - 4.0 K/uL   Monocytes Relative 5  3 - 12 %   Monocytes Absolute 0.6  0.1 - 1.0 K/uL   Eosinophils Relative 0  0 - 5 %   Eosinophils Absolute 0.0  0.0 - 0.7 K/uL   Basophils Relative 0  0 - 1 %   Basophils Absolute 0.0  0.0 - 0.1 K/uL  OCCULT BLOOD,  POC DEVICE     Status: Abnormal   Collection Time    08/22/13  2:08 AM      Result Value Range   Fecal Occult Bld POSITIVE (*) NEGATIVE  TYPE AND SCREEN     Status: None   Collection Time    08/22/13  2:35 AM      Result Value Range   ABO/RH(D) O POS     Antibody Screen NEG     Sample Expiration 08/25/2013     Unit Number [id number]     Blood Component Type RBC LR PHER2     Unit division 00     Status of Unit ALLOCATED     Donor AG Type NEGATIVE FOR KELL ANTIGEN     Transfusion Status OK TO TRANSFUSE     Crossmatch Result COMPATIBLE     Unit Number [id number]     Blood Component Type RED CELLS,LR     Unit division 00     Status of Unit ALLOCATED     Donor AG Type NEGATIVE FOR KELL ANTIGEN     Transfusion Status OK TO TRANSFUSE     Crossmatch Result COMPATIBLE    URINALYSIS, ROUTINE W REFLEX MICROSCOPIC     Status: Abnormal   Collection Time    08/22/13  5:32 AM      Result Value Range   Color, Urine YELLOW  YELLOW   APPearance CLOUDY (*) CLEAR   Specific Gravity, Urine 1.010  1.005 - 1.030   pH 5.0  5.0 - 8.0   Glucose, UA NEGATIVE  NEGATIVE mg/dL   Hgb urine dipstick MODERATE (*) NEGATIVE   Bilirubin Urine NEGATIVE  NEGATIVE   Ketones, ur NEGATIVE  NEGATIVE mg/dL   Protein, ur NEGATIVE  NEGATIVE mg/dL   Urobilinogen, UA 0.2  0.0 - 1.0 mg/dL   Nitrite NEGATIVE  NEGATIVE   Leukocytes, UA MODERATE (*) NEGATIVE  URINE MICROSCOPIC-ADD ON     Status: Abnormal   Collection Time    08/22/13  5:32 AM      Result Value Range   Squamous Epithelial / LPF MANY (*) RARE   WBC, UA 11-20  <3 WBC/hpf   RBC / HPF 3-6  <3 RBC/hpf   Bacteria, UA FEW (*) RARE  CBC     Status: Abnormal   Collection Time    08/22/13  8:50 AM      Result Value Range   WBC 12.8 (*) 4.0 - 10.5 K/uL   RBC 2.63 (*) 3.87 - 5.11 MIL/uL   Hemoglobin 7.6 (*) 12.0 - 15.0 g/dL   HCT 09.9 (*) 83.3 - 82.5 %   MCV 89.4  78.0 - 100.0 fL   MCH 28.9  26.0 - 34.0 pg   MCHC 32.3  30.0 - 36.0 g/dL   RDW 05.3   97.6 - 73.4 %   Platelets 228  150 - 400 K/uL  PREPARE RBC (CROSSMATCH)     Status: None   Collection Time    08/22/13  8:50 AM      Result Value Range   Order Confirmation ORDER PROCESSED BY BLOOD BANK    PROTIME-INR     Status: Abnormal   Collection Time    08/22/13  8:50 AM      Result Value Range   Prothrombin Time 15.7 (*) 11.6 - 15.2 seconds   INR 1.28  0.00 - 1.49  POCT I-STAT, CHEM 8     Status: Abnormal   Collection Time    08/22/13  9:08 AM      Result Value Range   Sodium 143  137 - 147 mEq/L   Potassium 4.0  3.7 - 5.3 mEq/L   Chloride 111  96 - 112 mEq/L   BUN 41 (*) 6 - 23 mg/dL   Creatinine, Ser 1.93  0.50 - 1.10 mg/dL   Glucose, Bld 88  70 - 99 mg/dL   Calcium, Ion 7.90  2.40 - 1.30 mmol/L   TCO2 17  0 - 100 mmol/L   Hemoglobin 8.2 (*) 12.0 - 15.0 g/dL   HCT 97.3 (*) 53.2 - 99.2 %     Ct Abdomen Pelvis W Contrast  08/22/2013   CLINICAL DATA:  Rectal bleeding.  Lactic acidosis.  EXAM: CT ABDOMEN AND PELVIS WITH CONTRAST  TECHNIQUE: Multidetector CT imaging of the abdomen and pelvis was performed using the standard protocol following bolus administration of intravenous contrast.  CONTRAST:  OMNIPAQUE IOHEXOL 300 MG/ML  SOLN  COMPARISON:  04/05/2010  FINDINGS: BODY WALL: Upper abdominal midline hernias, at least 2, containing omental fat.  LOWER CHEST: Cardiomegaly, with atrial dilatation predominating.  ABDOMEN/PELVIS:  Liver: No focal abnormality.  Biliary: No evidence of biliary obstruction or stone.  Pancreas: Unremarkable.  Spleen: Unremarkable.  Adrenals: Unremarkable.  Kidneys and ureters: Bilateral cortical cysts, the largest in the lower pole right kidney measuring 2.8 cm. No hydronephrosis.  Bladder: Unremarkable.  Reproductive: Coarsely calcified uterine fibroids, with the largest in the right uterine body measuring 8 cm.  Bowel: Extensive colonic diverticulosis. No evidence of colitis or diverticulitis. No bowel obstruction. Normal appendix. Surgical clips  noted near the GE junction and along the posterior uterine body.  Retroperitoneum: No mass or adenopathy.  Peritoneum: No free fluid or gas.  Vascular: No acute abnormality. There is aortic and branch vessel atherosclerosis. No evidence of acute mesenteric occlusion to explain lactic acidosis.  OSSEOUS: No acute abnormalities.  L4-5 anterolisthesis, grade 1, related to advanced facet osteoarthritis.  IMPRESSION: 1. No acute intra-abdominal findings. 2. Colonic diverticulosis. 3. Chronic findings are stable from 2011, noted above.   Electronically Signed   By: Jorje Guild M.D.   On: 08/22/2013 04:24   Dg Abd Acute W/chest  08/22/2013   CLINICAL DATA:  Rectal bleeding and history of diverticulosis  EXAM: ACUTE ABDOMEN SERIES (ABDOMEN 2 VIEW & CHEST 1 VIEW)  COMPARISON:  12/08/2010 chest x-ray  FINDINGS: Chronic cardiopericardial enlargement. Right approach dual-chamber pacer wires have been unchanged orientation. No infiltrate or edema. There is mild blunting of the lateral left costophrenic sulcus, not seen on abdominal imaging.  Mild S-shaped scoliosis of the thoracolumbar spine.  Nonobstructive bowel gas pattern. Postsurgical changes of the GE junction. Coarse calcifications in the pelvis, consistent with calcified uterine fibroid based on previous CT imaging - at least 8 cm in diameter. No pneumoperitoneum. No acute osseous findings.  IMPRESSION: Negative abdominal radiographs.  No acute cardiopulmonary disease.   Electronically Signed   By: Jorje Guild M.D.   On: 08/22/2013 00:27    ROS:  As stated above in the HPI otherwise negative.  Blood pressure 115/71, pulse 61, temperature 96.8 F (36 C), temperature source Rectal, resp. rate 15, SpO2 100.00%.    PE: Gen: NAD, Alert and Oriented HEENT:  [place]/AT, EOMI Neck: Supple, no LAD Lungs: CTA Bilaterally CV: RRR without M/G/R ABM: Soft, NTND, +BS Ext: No C/C/E  Assessment/Plan: 1) Hematochezia. 2) Diverticula 3) History of PUD.   It  appears that she has a diverticular bleed.  Currently she is hemodynamically stable and receiving blood transfusions.  Plan: 1) Monitor HGB and transfuse as needed. 2) Continue with supportive care.  [name] D 08/22/2013, 10:58 AM

## 2013-08-23 LAB — TYPE AND SCREEN
ABO/RH(D): O POS
Antibody Screen: NEGATIVE
Donor AG Type: NEGATIVE
Donor AG Type: NEGATIVE
UNIT DIVISION: 0
Unit division: 0

## 2013-08-23 LAB — CBC
HCT: 27.7 % — ABNORMAL LOW (ref 36.0–46.0)
HCT: 28.3 % — ABNORMAL LOW (ref 36.0–46.0)
HEMOGLOBIN: 9.2 g/dL — AB (ref 12.0–15.0)
Hemoglobin: 9.5 g/dL — ABNORMAL LOW (ref 12.0–15.0)
MCH: 29.4 pg (ref 26.0–34.0)
MCH: 29.9 pg (ref 26.0–34.0)
MCHC: 33.2 g/dL (ref 30.0–36.0)
MCHC: 33.6 g/dL (ref 30.0–36.0)
MCV: 88.5 fL (ref 78.0–100.0)
MCV: 89 fL (ref 78.0–100.0)
PLATELETS: 176 10*3/uL (ref 150–400)
Platelets: 168 10*3/uL (ref 150–400)
RBC: 3.13 MIL/uL — ABNORMAL LOW (ref 3.87–5.11)
RBC: 3.18 MIL/uL — ABNORMAL LOW (ref 3.87–5.11)
RDW: 14.7 % (ref 11.5–15.5)
RDW: 14.9 % (ref 11.5–15.5)
WBC: 10.2 10*3/uL (ref 4.0–10.5)
WBC: 9.2 10*3/uL (ref 4.0–10.5)

## 2013-08-23 LAB — BASIC METABOLIC PANEL
BUN: 22 mg/dL (ref 6–23)
CALCIUM: 7.8 mg/dL — AB (ref 8.4–10.5)
CO2: 16 mEq/L — ABNORMAL LOW (ref 19–32)
Chloride: 115 mEq/L — ABNORMAL HIGH (ref 96–112)
Creatinine, Ser: 0.98 mg/dL (ref 0.50–1.10)
GFR calc Af Amer: 64 mL/min — ABNORMAL LOW (ref >90)
GFR, EST NON AFRICAN AMERICAN: 55 mL/min — AB (ref >90)
Glucose, Bld: 85 mg/dL (ref 70–99)
POTASSIUM: 3.7 meq/L (ref 3.7–5.3)
SODIUM: 144 meq/L (ref 137–147)

## 2013-08-23 LAB — URINE CULTURE: Colony Count: 65000

## 2013-08-23 LAB — CORTISOL: Cortisol, Plasma: 7.2 ug/dL

## 2013-08-23 LAB — PROCALCITONIN

## 2013-08-23 MED ORDER — SODIUM CHLORIDE 0.9 % IV SOLN
INTRAVENOUS | Status: AC
  Administered 2013-08-23: 10 mL/h via INTRAVENOUS
  Administered 2013-08-23: 09:00:00 via INTRAVENOUS

## 2013-08-23 MED ORDER — PANTOPRAZOLE SODIUM 40 MG PO TBEC
40.0000 mg | DELAYED_RELEASE_TABLET | Freq: Every day | ORAL | Status: DC
  Administered 2013-08-23: 40 mg via ORAL
  Filled 2013-08-23: qty 1

## 2013-08-23 NOTE — Progress Notes (Signed)
Family Medicine Teaching Service Daily Progress Note Intern Pager: [phone]  Patient name: [name] Medical record number: [id number] Date of birth: 1940-08-02 Age: 73 y.o. Gender: female  Primary Care Provider: Kennith Maes, DO Consultants: GI Code Status: Full   Pt Overview and Major Events to Date:  08/22/13 - Admission 1/31/165 - Transfusion 2 Units PRBC's; Empiric Zosyn  Assessment and Plan: [name] is a 73 y.o. female presenting with 2-3 days of grossly bloody bowel movements concerning for lower GI bleed. 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 fib, PUD with GI bleed, diverticulosis, and hypertension.   Lower GI bleed - Hb 8.6 on admission.  Subsequently dropped to 7.6.  s/p transfusion 2 Units PRBC on 1/31. - GI consulted, appreciate recs.  - Hb stable this am @ 9.5 - Will continue to monitor closely with daily CBC.  No need for further intervention currently.  Lactic acidosis, leukocytosis. Patient now with combined AG and Non-AG acidosis (likely combination of diarrhea/bloody stool, lactic acidosis, hyperchloremia) - Procalcitonin < 0.10. WBC count now improved. - Unclear etiology.  No current infectious source. - Will follow cultures.  D/C Zosyn today. - Daily BMP to reassess.   Complete AV block, HOCM, diastolic dysfunction, A. fib - status post pacemaker placement - Follows with Dr. Rayann Heman. Echo 12/23/2012 with EF of 38%, diastolic dysfunction, no LVOT obstruction  - Holding antihypertensives given low BP's.  Gentle hydration with IV fluids.  - Not candidate for anticoagulation given prior and current GI bleed.   Hypertension  - Holding meds in the setting of low/norm BP's.  FEN/GI: NS - KVO PPI; Heart Healthy Diet Prophylaxis: SCDs  Disposition: Transfer to Telemetry today.  Will watch overnight. If remains stable can D/C home tomorrow.  Subjective: Feeling well this am.  No complaints.  Denies hematochezia, chest  pain, SOB, abdominal pain, dysuria.   Objective: Temp:  [97.2 F (36.2 C)-100.2 F (37.9 C)] 98.9 F (37.2 C) (02/01 0750) Pulse Rate:  [55-71] 62 (02/01 0750) Resp:  [14-26] 20 (02/01 0750) BP: (94-132)/(53-78) 120/68 mmHg (02/01 0750) SpO2:  [98 %-100 %] 100 % (02/01 0750) Weight:  [121 lb 0.5 oz (54.9 kg)-121 lb 7.6 oz (55.1 kg)] 121 lb 7.6 oz (55.1 kg) (02/01 0414)  Physical Exam: General: well appearing, resting in bed, NAD.  Cardiovascular: RRR. 2/6 systolic murmur noted. Respiratory: CTAB. No rales, rhonchi, or wheezing. Abdomen: flat, soft, nontender, nondistended.  No palpable organomegaly. Extremities: No edema.  Laboratory:  Recent Labs Lab 08/22/13 0850 08/22/13 0908 08/22/13 2332 08/23/13 0734  WBC 12.8*  --  10.2 9.2  HGB 7.6* 8.2* 9.2* 9.5*  HCT 23.5* 24.0* 27.7* 28.3*  PLT 228  --  176 168    Recent Labs Lab 08/21/13 2304 08/22/13 0908  NA 142 143  K 4.3 4.0  CL 110 111  CO2 17*  --   BUN 57* 41*  CREATININE 1.01 1.00  CALCIUM 8.5  --   PROT 5.5*  --   BILITOT 0.5  --   ALKPHOS 58  --   ALT 9  --   AST 16  --   GLUCOSE 137* 88   Urinalysis    Component Value Date/Time   COLORURINE YELLOW 08/22/2013 0532   APPEARANCEUR CLOUDY* 08/22/2013 0532   LABSPEC 1.010 08/22/2013 0532   PHURINE 5.0 08/22/2013 0532   GLUCOSEU NEGATIVE 08/22/2013 0532   HGBUR MODERATE* 08/22/2013 0532   BILIRUBINUR NEGATIVE 08/22/2013 0532   KETONESUR NEGATIVE 08/22/2013  0532   PROTEINUR NEGATIVE 08/22/2013 0532   UROBILINOGEN 0.2 08/22/2013 0532   NITRITE NEGATIVE 08/22/2013 0532   LEUKOCYTESUR MODERATE* 08/22/2013 0532   Imaging/Diagnostic Tests:  Ct Abdomen Pelvis W Contrast 08/22/2013    IMPRESSION: 1. No acute intra-abdominal findings. 2. Colonic diverticulosis. 3. Chronic findings are stable from 2011, noted above.  Dg Abd Acute W/chest 08/22/2013  IMPRESSION: Negative abdominal radiographs.  No acute cardiopulmonary disease.  Coral Spikes, DO 08/23/2013, 8:14  AM PGY-2 [place] Intern pager: [phone], text pages welcome

## 2013-08-23 NOTE — Progress Notes (Signed)
FMTS Attending Daily Note: Sonya Mcmurray MD [phone] pager office [phone] I  have seen and examined this patient, reviewed their chart. I have discussed this patient with the resident. I agree with the resident's findings, assessment and care plan. Tolerated transfusions well. We will follow her hemoglobin for the next 24 hours and is stable she can likely be discharged home tomorrow. She feels much better.

## 2013-08-23 NOTE — Progress Notes (Signed)
Subjective: No complaints.  Nursing reports passage of some old blood early last evening.  Objective: Vital signs in last 24 hours: Temp:  [97.2 F (36.2 C)-100.2 F (37.9 C)] 98.9 F (37.2 C) (02/01 0750) Pulse Rate:  [55-71] 62 (02/01 0750) Resp:  [14-26] 20 (02/01 0750) BP: (94-132)/(53-78) 120/68 mmHg (02/01 0750) SpO2:  [98 %-100 %] 100 % (02/01 0750) Weight:  [121 lb 0.5 oz (54.9 kg)-121 lb 7.6 oz (55.1 kg)] 121 lb 7.6 oz (55.1 kg) (02/01 0414) Last BM Date: 08/22/13  Intake/Output from previous day: 01/31 0701 - 02/01 0700 In: 1062.5 [I.V.:700; Blood:287.5; IV Piggyback:75] Out: 950 [Urine:950] Intake/Output this shift:    General appearance: alert and no distress GI: soft, non-tender; bowel sounds normal; no masses,  no organomegaly  Lab Results:  Recent Labs  08/22/13 0850 08/22/13 0908 08/22/13 2332 08/23/13 0734  WBC 12.8*  --  10.2 9.2  HGB 7.6* 8.2* 9.2* 9.5*  HCT 23.5* 24.0* 27.7* 28.3*  PLT 228  --  176 168   BMET  Recent Labs  08/21/13 2304 08/22/13 0908  NA 142 143  K 4.3 4.0  CL 110 111  CO2 17*  --   GLUCOSE 137* 88  BUN 57* 41*  CREATININE 1.01 1.00  CALCIUM 8.5  --    LFT  Recent Labs  08/21/13 2304  PROT 5.5*  ALBUMIN 2.8*  AST 16  ALT 9  ALKPHOS 58  BILITOT 0.5   PT/INR  Recent Labs  08/22/13 0850  LABPROT 15.7*  INR 1.28   Hepatitis Panel No results found for this basename: HEPBSAG, HCVAB, HEPAIGM, HEPBIGM,  in the last 72 hours C-Diff No results found for this basename: CDIFFTOX,  in the last 72 hours Fecal Lactopherrin No results found for this basename: FECLLACTOFRN,  in the last 72 hours  Studies/Results: Ct Abdomen Pelvis W Contrast  08/22/2013   CLINICAL DATA:  Rectal bleeding.  Lactic acidosis.  EXAM: CT ABDOMEN AND PELVIS WITH CONTRAST  TECHNIQUE: Multidetector CT imaging of the abdomen and pelvis was performed using the standard protocol following bolus administration of intravenous contrast.   CONTRAST:  147mL OMNIPAQUE IOHEXOL 300 MG/ML  SOLN  COMPARISON:  04/05/2010  FINDINGS: BODY WALL: Upper abdominal midline hernias, at least 2, containing omental fat.  LOWER CHEST: Cardiomegaly, with atrial dilatation predominating.  ABDOMEN/PELVIS:  Liver: No focal abnormality.  Biliary: No evidence of biliary obstruction or stone.  Pancreas: Unremarkable.  Spleen: Unremarkable.  Adrenals: Unremarkable.  Kidneys and ureters: Bilateral cortical cysts, the largest in the lower pole right kidney measuring 2.8 cm. No hydronephrosis.  Bladder: Unremarkable.  Reproductive: Coarsely calcified uterine fibroids, with the largest in the right uterine body measuring 8 cm.  Bowel: Extensive colonic diverticulosis. No evidence of colitis or diverticulitis. No bowel obstruction. Normal appendix. Surgical clips noted near the GE junction and along the posterior uterine body.  Retroperitoneum: No mass or adenopathy.  Peritoneum: No free fluid or gas.  Vascular: No acute abnormality. There is aortic and branch vessel atherosclerosis. No evidence of acute mesenteric occlusion to explain lactic acidosis.  OSSEOUS: No acute abnormalities. L4-5 anterolisthesis, grade 1, related to advanced facet osteoarthritis.  IMPRESSION: 1. No acute intra-abdominal findings. 2. Colonic diverticulosis. 3. Chronic findings are stable from 2011, noted above.   Electronically Signed   By: Jorje Guild M.D.   On: 08/22/2013 04:24   Dg Abd Acute W/chest  08/22/2013   CLINICAL DATA:  Rectal bleeding and history of diverticulosis  EXAM: ACUTE ABDOMEN  SERIES (ABDOMEN 2 VIEW & CHEST 1 VIEW)  COMPARISON:  12/08/2010 chest x-ray  FINDINGS: Chronic cardiopericardial enlargement. Right approach dual-chamber pacer wires have been unchanged orientation. No infiltrate or edema. There is mild blunting of the lateral left costophrenic sulcus, not seen on abdominal imaging.  Mild S-shaped scoliosis of the thoracolumbar spine.  Nonobstructive bowel gas pattern.  Postsurgical changes of the GE junction. Coarse calcifications in the pelvis, consistent with calcified uterine fibroid based on previous CT imaging - at least 8 cm in diameter. No pneumoperitoneum. No acute osseous findings.  IMPRESSION: Negative abdominal radiographs.  No acute cardiopulmonary disease.   Electronically Signed   By: Jorje Guild M.D.   On: 08/22/2013 00:27    Medications:  Scheduled: . acetaminophen  650 mg Rectal Once  . Chlorhexidine Gluconate Cloth  6 each Topical Q0600  . mupirocin ointment  1 application Nasal BID  . pantoprazole (PROTONIX) IV  40 mg Intravenous Q24H  . piperacillin-tazobactam (ZOSYN)  IV  3.375 g Intravenous Q8H  . sodium chloride  3 mL Intravenous Q12H   Continuous:   Assessment/Plan: 1) Probable diverticular bleed. 2) History of PUD.   She is stable.  HGB increased with blood transfusions.  No complaints at this time.  Plan: 1) Continue to monitor HGB and transfuse as necessary. 2) ? Home tomorrow if no further bleeding.   LOS: 2 days   [name] D 08/23/2013, 8:27 AM

## 2013-08-23 NOTE — Progress Notes (Deleted)
FMTS Attending Daily Note: Sonya Mcmurray MD [phone] pager office [phone] I  have seen and examined this patient, reviewed their chart. I have discussed this patient with the resident. I agree with the resident's findings, assessment and care plan. On my exam today she was alert and oriented x4, answer questions appropriately. 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 She also reports some episodic unsteadiness ever since she had a toe problem a couple of years ago but says that today's unsteadiness seems worse. She does not seem any more short of breath at rest and her apparent baseline COPD. Given her report of waxing and waning mental status I think we'll go ahead and get MRI of her brain.

## 2013-08-24 DIAGNOSIS — K219 Gastro-esophageal reflux disease without esophagitis: Secondary | ICD-10-CM

## 2013-08-24 DIAGNOSIS — R634 Abnormal weight loss: Secondary | ICD-10-CM

## 2013-08-24 DIAGNOSIS — D599 Acquired hemolytic anemia, unspecified: Secondary | ICD-10-CM

## 2013-08-24 DIAGNOSIS — I4891 Unspecified atrial fibrillation: Secondary | ICD-10-CM

## 2013-08-24 DIAGNOSIS — I10 Essential (primary) hypertension: Secondary | ICD-10-CM

## 2013-08-24 LAB — CBC
HCT: 28.4 % — ABNORMAL LOW (ref 36.0–46.0)
HEMOGLOBIN: 9.3 g/dL — AB (ref 12.0–15.0)
MCH: 29.2 pg (ref 26.0–34.0)
MCHC: 32.7 g/dL (ref 30.0–36.0)
MCV: 89.3 fL (ref 78.0–100.0)
Platelets: 182 10*3/uL (ref 150–400)
RBC: 3.18 MIL/uL — AB (ref 3.87–5.11)
RDW: 14.9 % (ref 11.5–15.5)
WBC: 7.9 10*3/uL (ref 4.0–10.5)

## 2013-08-24 LAB — BASIC METABOLIC PANEL
BUN: 15 mg/dL (ref 6–23)
CALCIUM: 8.2 mg/dL — AB (ref 8.4–10.5)
CHLORIDE: 109 meq/L (ref 96–112)
CO2: 16 meq/L — AB (ref 19–32)
Creatinine, Ser: 0.94 mg/dL (ref 0.50–1.10)
GFR calc Af Amer: 67 mL/min — ABNORMAL LOW (ref >90)
GFR calc non Af Amer: 58 mL/min — ABNORMAL LOW (ref >90)
Glucose, Bld: 77 mg/dL (ref 70–99)
POTASSIUM: 3.4 meq/L — AB (ref 3.7–5.3)
SODIUM: 142 meq/L (ref 137–147)

## 2013-08-24 LAB — PROCALCITONIN

## 2013-08-24 MED ORDER — POTASSIUM CHLORIDE CRYS ER 20 MEQ PO TBCR
40.0000 meq | EXTENDED_RELEASE_TABLET | Freq: Once | ORAL | Status: AC
  Administered 2013-08-24: 40 meq via ORAL
  Filled 2013-08-24: qty 2

## 2013-08-24 MED ORDER — OMEPRAZOLE 20 MG PO CPDR: 20.0000 mg | DELAYED_RELEASE_CAPSULE | Freq: Every day | ORAL | Status: DC

## 2013-08-24 NOTE — Progress Notes (Signed)
Family Medicine Teaching Service Daily Progress Note Intern Pager: [phone]  Patient name: [name] Medical record number: [id number] Date of birth: 29-Sep-1940 Age: 73 y.o. Gender: female  Primary Care Provider: Kennith Maes, DO Consultants: GI Code Status: Full   Pt Overview and Major Events to Date:  1/31 - Admission 1/31 - Transfusion 2 Units PRBC's; Empiric Zosyn 2/1 - Hgb 7.6-->8.2-->9.2 2/2 - Hgb 9.3  Assessment and Plan: [name] is a 73 y.o. female presenting with 2-3 days of grossly bloody bowel movements concerning for lower GI bleed. 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 fib, PUD with GI bleed, diverticulosis, and hypertension.   # Lower GI bleed, with secondary acute blood loss anemia - Improved Hb 8.6 on admission.  Subsequently dropped to 7.6.  s/p transfusion 2 Units PRBC on 1/31. - stable Hgb trend 7.6-->8.2-->9.2-->9.5-->9.3 - GI consulted, appreciate recs.     - suspect diverticular source of bleed. Stable Hgb, can DC within 24 hrs if no further bleeding  - Will continue to monitor closely with daily CBC.  No need for further intervention currently. - Consider DC today since stable  # Lactic acidosis, Leukocytosis - Resolved Patient now with combined AG and Non-AG acidosis (likely combination of diarrhea/bloody stool, lactic acidosis, hyperchloremia). Last LA 3.42 (08/21/13) - Procalcitonin < 0.10. WBC count now improved. - DC'd Zosyn (08/23/13) s/p improvement, Unclear etiology.  No current infectious source. - Follow pending cultures: Blood x2 (1/31, NGTD), Urine (not significant, 1/31),  - Daily BMP to reassess.   # Complete AV block, HOCM, diastolic dysfunction, A. fib - status post pacemaker placement - Follows with Dr. Rayann Heman. Echo 12/23/2012 with EF of 13%, diastolic dysfunction, no LVOT obstruction  - Holding antihypertensives given low BP's (last BP 108/63).  Gentle hydration with IV fluids.  - Not  candidate for anticoagulation given prior and current GI bleed.   Hypertension  - Holding meds in the setting of low/norm BP's.  # Hypokalemia - K 3.7-->3.4 - replete with PO KCl 48mEq x 1  FEN/GI: NS - KVO PPI; Heart Healthy Diet Prophylaxis: SCDs  Disposition: Transferred to telemetry yesterday, no longer SDU status. Hgb remained stable 24 hrs. Expect to discharge to home today.  Subjective:  No acute events overnight. Reports that she feels better this morning. No complaints at this time. Tolerating some increased PO intake (inc liquids, less solids), which is about at baseline per daughter at bedside. Denies any further bloody stools, note small normal BM this morning.  Denies fever/chills, HA, CP, dyspnea, abdominal pain, hematochezia, or any active bleeding, nausea / vomiting.  Objective: Temp:  [98.3 F (36.8 C)-99.4 F (37.4 C)] 99.4 F (37.4 C) (02/02 0453) Pulse Rate:  [61-71] 68 (02/02 0453) Resp:  [18-20] 18 (02/02 0453) BP: (108-129)/(63-82) 108/63 mmHg (02/02 0453) SpO2:  [98 %-100 %] 100 % (02/02 0453) Weight:  [124 lb 1.9 oz (56.3 kg)] 124 lb 1.9 oz (56.3 kg) (02/02 0453)  Physical Exam: Gen: comfortably resting in bed, pleasant, conversational, NAD HEENT: MMM Cardio: RRR. 2/6 systolic murmur noted. Resp: CTAB. No rales, rhonchi, or wheezing. Abdomen: soft, NTND, no masses, +active BS Ext: No edema, non-tender Neuro - awake, alert, oriented, grossly non-focal  Laboratory:  Recent Labs Lab 08/22/13 2332 08/23/13 0734 08/24/13 0524  WBC 10.2 9.2 7.9  HGB 9.2* 9.5* 9.3*  HCT 27.7* 28.3* 28.4*  PLT 176 168 182    Recent Labs Lab 08/21/13 2304 08/22/13 0908 08/23/13 0915 08/24/13 0865  NA 142 143 144 142  K 4.3 4.0 3.7 3.4*  CL 110 111 115* 109  CO2 17*  --  16* 16*  BUN 57* 41* 22 15  CREATININE 1.01 1.00 0.98 0.94  CALCIUM 8.5  --  7.8* 8.2*  PROT 5.5*  --   --   --   BILITOT 0.5  --   --   --   ALKPHOS 58  --   --   --   ALT 9  --   --    --   AST 16  --   --   --   GLUCOSE 137* 88 85 77   Urinalysis    Component Value Date/Time   COLORURINE YELLOW 08/22/2013 0532   APPEARANCEUR CLOUDY* 08/22/2013 0532   LABSPEC 1.010 08/22/2013 0532   PHURINE 5.0 08/22/2013 0532   GLUCOSEU NEGATIVE 08/22/2013 0532   HGBUR MODERATE* 08/22/2013 0532   BILIRUBINUR NEGATIVE 08/22/2013 0532   KETONESUR NEGATIVE 08/22/2013 0532   PROTEINUR NEGATIVE 08/22/2013 0532   UROBILINOGEN 0.2 08/22/2013 0532   NITRITE NEGATIVE 08/22/2013 0532   LEUKOCYTESUR MODERATE* 08/22/2013 0532   Imaging/Diagnostic Tests:  Ct Abdomen Pelvis W Contrast 08/22/2013    IMPRESSION: 1. No acute intra-abdominal findings. 2. Colonic diverticulosis. 3. Chronic findings are stable from 2011, noted above.  Dg Abd Acute W/chest 08/22/2013  IMPRESSION: Negative abdominal radiographs.  No acute cardiopulmonary disease.  Nobie Putnam, DO 08/24/2013, 9:32 AM PGY-1 [facility] Intern pager: [phone], text pages welcome

## 2013-08-24 NOTE — Discharge Instructions (Signed)
You were hospitalized for a gastrointestinal bleed likely from the diverticulosis in your colon, and found to be anemic due to acute blood loss. Overall, you have shown significant improvement following a 2 unit blood transfusion. Your blood count has stabilized since the transfusion. The GI doctors have followed you in the hospital as well, and will likely continue to see you in their clinic.  New medication changes on discharge: - Hold your Amlodipine 5mg  daily (blood pressure med) until you see one of our doctors in the clinic, your blood pressure has been relatively low, and you do not need this medicine currently. - Start new Omeprazole 20mg  daily, anti-acid med  If your symptoms return or get significantly worse, with bloody stool, worsening abdominal pain, or any other acute blood loss (vomiting), then we recommend seeking immediate medical attention in the Emergency Department. If you have any other concerns, please call our clinic.   Diverticulosis Diverticulosis is a common condition that develops when small pouches (diverticula) form in the wall of the colon. The risk of diverticulosis increases with age. It happens more often in people who eat a low-fiber diet. Most individuals with diverticulosis have no symptoms. Those individuals with symptoms usually experience abdominal pain, constipation, or loose stools (diarrhea). HOME CARE INSTRUCTIONS   Increase the amount of fiber in your diet as directed by your caregiver or dietician. This may reduce symptoms of diverticulosis.  Your caregiver may recommend taking a dietary fiber supplement.  Drink at least 6 to 8 glasses of water each day to prevent constipation.  Try not to strain when you have a bowel movement.  Your caregiver may recommend avoiding nuts and seeds to prevent complications, although this is still an uncertain benefit.  Only take over-the-counter or prescription medicines for pain, discomfort, or fever as directed by  your caregiver. FOODS WITH HIGH FIBER CONTENT INCLUDE:  Fruits. Apple, peach, pear, tangerine, raisins, prunes.  Vegetables. Brussels sprouts, asparagus, broccoli, cabbage, carrot, cauliflower, romaine lettuce, spinach, summer squash, tomato, winter squash, zucchini.  Starchy Vegetables. Baked beans, kidney beans, lima beans, split peas, lentils, potatoes (with skin).  Grains. Whole wheat bread, brown rice, bran flake cereal, plain oatmeal, white rice, shredded wheat, bran muffins. SEEK IMMEDIATE MEDICAL CARE IF:   You develop increasing pain or severe bloating.  You have an oral temperature above 102 F (38.9 C), not controlled by medicine.  You develop vomiting or bowel movements that are bloody or black. Document Released: 04/05/2004 Document Revised: 10/01/2011 Document Reviewed: 12/07/2009 [facility] Patient Information 2014 [place].

## 2013-08-24 NOTE — Progress Notes (Signed)
          Daily Rounding Note  08/24/2013, 9:00 AM  LOS: 3 days   SUBJECTIVE:       Loose stool this AM, no blood.  Appetite at baseline, reduced.  No nausea, no dizziness. Tells me she uses [employer] a few times a week at most, for headaches.  Does not have a PMD  OBJECTIVE:         Vital signs in last 24 hours:    Temp:  [98.3 F (36.8 C)-99.4 F (37.4 C)] 99.4 F (37.4 C) (02/02 0453) Pulse Rate:  [61-71] 68 (02/02 0453) Resp:  [18-20] 18 (02/02 0453) BP: (108-129)/(63-82) 108/63 mmHg (02/02 0453) SpO2:  [98 %-100 %] 100 % (02/02 0453) Weight:  [56.3 kg (124 lb 1.9 oz)] 56.3 kg (124 lb 1.9 oz) (02/02 0453) Last BM Date: 08/23/13 General: looks generally unwell.   Heart: RRR with 3/6 SM Chest: clear, no labored breathing Abdomen: soft, NT, ND.  Active BS  Extremities: no CCE Neuro/Psych:  Pleasant, not confused.   Intake/Output from previous day: 02/01 0701 - 02/02 0700 In: 327.5 [P.O.:120; I.V.:195; IV Piggyback:12.5] Out: 550 [Urine:550]  Intake/Output this shift:    Lab Results:  Recent Labs  08/22/13 2332 08/23/13 0734 08/24/13 0524  WBC 10.2 9.2 7.9  HGB 9.2* 9.5* 9.3*  HCT 27.7* 28.3* 28.4*  PLT 176 168 182   BMET  Recent Labs  08/21/13 2304 08/22/13 0908 08/23/13 0915 08/24/13 0524  NA 142 143 144 142  K 4.3 4.0 3.7 3.4*  CL 110 111 115* 109  CO2 17*  --  16* 16*  GLUCOSE 137* 88 85 77  BUN 57* 41* 22 15  CREATININE 1.01 1.00 0.98 0.94  CALCIUM 8.5  --  7.8* 8.2*   LFT  Recent Labs  08/21/13 2304  PROT 5.5*  ALBUMIN 2.8*  AST 16  ALT 9  ALKPHOS 58  BILITOT 0.5   PT/INR  Recent Labs  08/22/13 0850  LABPROT 15.7*  INR 1.28   Studies/Results: No results found.  ASSESMENT:   *  Acute hematochezia, presumed diverticular bleed.  CT scan shows diverticula.  No colonoscopy in Epic.  However BUN initially elevated, now normal.   *  Hx PUD, s/p remote [place] 1.  07/2008 bleed,  anemia from ulcer at anastomosis, no H pylori on biopsy.  No PPI or ASA/NSAID PTA.   *  ABL anemia.  Stable Hgb post transfusion PRBC x 2.  Hgb in 2012 was 12.5.    PLAN   *  Home today?  Cleared for discharge from GI standpoint.   *  Given hx PUD and intermittent ongoing ASA powder use, would discharge on daily PPI (generic Omeprazole fine) *  Follow up with PMD.    [name]  08/24/2013, 9:00 AM Pager: [phone]

## 2013-08-24 NOTE — Progress Notes (Signed)
I agree with the above documentation, including the assessment and plan. Close followup with PCP

## 2013-08-24 NOTE — Discharge Summary (Signed)
[facility] Discharge Summary  Patient name: [name] Medical record number: [id number] Date of birth: Nov 02, 1940 Age: 73 y.o. Gender: female Date of Admission: 08/21/2013  Date of Discharge: 08/24/13 Admitting Physician: Dickie La, MD  Primary Care Provider: Kennith Maes, DO Consultants: GI  Indication for Hospitalization: Acute hematochezia, Lower GI Bleed  Discharge Diagnoses/Problem List:  Acute hematochezia, presumed secondary to diverticular bleed - Resolved Acute Blood Loss Anemia and Hypovolemia, secondary to lower GI bleed, s/p 2u PRBC Transfusion - Resolved Hypotension, Lactic acidosis, Leukocytosis, Ruled out sepsis/bacteremia - Resolved H/o Complete AV Block secondary to HOCM, Diastolic Dysfunction, s/p pacemaker placement H/o Atrial Fibrillation, rate controlled HTN Hypokalemia - Resolved  Disposition: Home   Discharge Condition: Stable  Brief Hospital Course: [name] is a 73 y.o. female who presented with 2-3 days of grossly bloody bowel movements concerning for lower GI bleed. 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 fib, PUD with GI bleed 2010 from ulcer at anastomosis, diverticulosis, and hypertension.  # Acute hematochezia, presumed secondary to diverticular bleed - Resolved # Acute Blood Loss Anemia and Hypovolemia, secondary to lower GI bleed, s/p 2u PRBC Transfusion - Resolved # Hypotension, Lactic acidosis, Leukocytosis - Ruled out sepsis/bacteremia - Resolved Presented with acute hematochezia x 2-3 days without associated abdominal pain (benign abdomen on exam), initially presented with mildly low BP 96/59, afebrile, increased fatigue and sleepiness (appropriate mental status), initial work-up showed Hgb 8.6 (b/l 10-12), repeated at 7.6, LA 3.42, Bicarb 17, AG 15, WBC 12.2 (L-shift), UA (mod leuks, WBC 11-20, mod Hgb, many squam, few bacteria), Pro-calcitonin <0.10, CT abd showed no  acute findings, noted diverticulosis. Admitted to SDU, closely monitored with continued drop in BP (80s/40s), given concern for potential sepsis with either urinary or gut flora source (microperf, diverticulum), cultures collected 1/31 (urine - mixed growth 65,000CFU non-diagnostic, and blood cultures x 2, NGTD). Covered empirically with Zosyn, continued NS bolus, transfuse 2u PRBC. Discussed case with CCM with concern of potential non-response to volume replacement. Consulted GI for lower GI bleed, suspected diverticular bleed. Continued with significant improvent following fluid resuscitation and blood transfusion, BP stable, Hgb stable 9.5-9.3 on discharge, resolved bleeding with non-bloody BM, clinically improved tolerating diet, no further complaints. GI recommended PPI (omeprazole 20mg  daily) given h/o PUD, diverticula, and intermittent ASA powder use.  # H/o Complete AV Block secondary to HOCM, Diastolic Dysfunction, s/p pacemaker placement # H/o Atrial Fibrillation, rate controlled # HTN  Follows with Dr. Rayann Heman. Echo 12/23/2012 with EF of 76%, diastolic dysfunction, no LVOT obstruction  - Initially held antihypertensives given hypotension. On discharge resumed Metoprolol for AFib rate control, as patient is not candidate for anticoagulation (given prior and current GI bleed). - Continued to hold Amlodipine 5mg  daily on discharge, recommend to re-assess BP on follow-up to consider resuming  # Hypokalemia  - K 3.7-->3.4  - replete with PO KCl 60mEq x 1  Issues for Follow Up: 1. GI Follow-up - No documented colonoscopy, expect patient would benefit from future GI f/u, consider colonoscopy 2. Repeat Hgb check 1-2 weeks 3. HTN - Held Amlodipine 5mg  daily, re-check BP at f/u consider resuming vs discontinuing. Continued Metoprolol-Succ-XL 100mg  daily  Significant Procedures: 1/31 - 2u PRBC Transfusion  Significant Labs and Imaging:   Recent Labs Lab 08/22/13 2332 08/23/13 0734  08/24/13 0524  WBC 10.2 9.2 7.9  HGB 9.2* 9.5* 9.3*  HCT 27.7* 28.3* 28.4*  PLT 176 168 182    Recent  Labs Lab 08/21/13 2304 08/22/13 0908 08/23/13 0915 08/24/13 0524  NA 142 143 144 142  K 4.3 4.0 3.7 3.4*  CL 110 111 115* 109  CO2 17*  --  16* 16*  GLUCOSE 137* 88 85 77  BUN 57* 41* 22 15  CREATININE 1.01 1.00 0.98 0.94  CALCIUM 8.5  --  7.8* 8.2*  ALKPHOS 58  --   --   --   AST 16  --   --   --   ALT 9  --   --   --   ALBUMIN 2.8*  --   --   --    LA 3.42 Pro-calcitonin < 0.10  Ct Abdomen Pelvis W Contrast  08/22/2013 IMPRESSION: 1. No acute intra-abdominal findings. 2. Colonic diverticulosis. 3. Chronic findings are stable from 2011, noted above.  Dg Abd Acute W/chest  08/22/2013 IMPRESSION: Negative abdominal radiographs. No acute cardiopulmonary disease.  1/31 Urine Culture - Multiple bacterial morphotypes present, none predominant. Suggest appropriate recollection if clinically indicated. 65,000 CFU.  1/31 Blood Culture x2 (NGTD, pending final result)  Results/Tests Pending at Time of Discharge: 1. 1/31 Blood Culture x2 (NGTD, pending final result)  Discharge Medications:    Medication List    STOP taking these medications       amLODipine 5 MG tablet  Commonly known as:  NORVASC      TAKE these medications       metoprolol succinate 100 MG 24 hr tablet  Commonly known as:  TOPROL-XL  Take 1 tablet (100 mg total) by mouth daily.     omeprazole 20 MG capsule  Commonly known as:  PRILOSEC  Take 1 capsule (20 mg total) by mouth daily.        Discharge Instructions: Please refer to Patient Instructions section of EMR for full details.  Patient was counseled important signs and symptoms that should prompt return to medical care, changes in medications, dietary instructions, activity restrictions, and follow up appointments.   Follow-Up Appointments:     Follow-up Information   Follow up with Kennith Maes, DO On 08/27/2013. (at 1:45pm with Dr. Awanda Mink)     Specialty:  Family Medicine   Contact information:   [place] [id number] [place], DO 08/24/2013, 6:19 PM PGY-1, [place]

## 2013-08-24 NOTE — Progress Notes (Signed)
Pt discharged to home with daughter. Discharge instructions given.pt and daughter verbalized understanding. No s/s of distress no voiced complaints. Encouraged pt to call with needs.

## 2013-08-26 NOTE — Progress Notes (Signed)
FMTS Attending Daily Note:  Annabell Sabal MD  [phone] pager  Family Practice pager:  [phone] I have seen and examined this patient and have reviewed their chart. I have discussed this patient with the resident. I agree with the resident's findings, assessment and care plan.  *Late note.  Seen and examined on day of discharge.   Alveda Reasons, MD 08/26/2013 5:39 PM

## 2013-08-27 ENCOUNTER — Encounter: Payer: Self-pay | Admitting: Family Medicine

## 2013-08-27 ENCOUNTER — Ambulatory Visit (INDEPENDENT_AMBULATORY_CARE_PROVIDER_SITE_OTHER): Payer: Medicare Other | Admitting: Family Medicine

## 2013-08-27 VITALS — BP 124/76 | HR 85 | Temp 97.7°F | Ht 63.0 in | Wt 117.7 lb

## 2013-08-27 DIAGNOSIS — I10 Essential (primary) hypertension: Secondary | ICD-10-CM

## 2013-08-27 DIAGNOSIS — K922 Gastrointestinal hemorrhage, unspecified: Secondary | ICD-10-CM

## 2013-08-27 LAB — CBC WITH DIFFERENTIAL/PLATELET
BASOS ABS: 0 10*3/uL (ref 0.0–0.1)
Basophils Relative: 0 % (ref 0–1)
Eosinophils Absolute: 0.3 10*3/uL (ref 0.0–0.7)
Eosinophils Relative: 3 % (ref 0–5)
HCT: 31.5 % — ABNORMAL LOW (ref 36.0–46.0)
HEMOGLOBIN: 10.3 g/dL — AB (ref 12.0–15.0)
LYMPHS PCT: 9 % — AB (ref 12–46)
Lymphs Abs: 0.7 10*3/uL (ref 0.7–4.0)
MCH: 29.2 pg (ref 26.0–34.0)
MCHC: 32.7 g/dL (ref 30.0–36.0)
MCV: 89.2 fL (ref 78.0–100.0)
Monocytes Absolute: 0.8 10*3/uL (ref 0.1–1.0)
Monocytes Relative: 10 % (ref 3–12)
Neutro Abs: 6.2 10*3/uL (ref 1.7–7.7)
Neutrophils Relative %: 78 % — ABNORMAL HIGH (ref 43–77)
Platelets: 338 10*3/uL (ref 150–400)
RBC: 3.53 MIL/uL — ABNORMAL LOW (ref 3.87–5.11)
RDW: 14.5 % (ref 11.5–15.5)
WBC: 8 10*3/uL (ref 4.0–10.5)

## 2013-08-27 NOTE — Assessment & Plan Note (Signed)
Pt recently d/c from hospital for presumed lower GIB with hypotension and hematochezia.  Was seen by GI in hospital who recommend outpt colonoscopy, but the appointment was not set up. Since being d/c from the hospital, she denies any further melena, hematochezia, abdominal pain, nausea, vomiting, diarrhea, lightheadedness, dizziness, hematemesis or dyspepsia.  No pre-syncope or syncope with standing or orthostatic signs.  Will obtain H&H today to evaluate for stable Hgb, GI referral for colonoscopy to evaluate for possible cause.  BP stable today at 124, will not restart norvasc at this point, and f/u in 1-2 months at which point consider restarting norvasc.

## 2013-08-27 NOTE — Assessment & Plan Note (Signed)
BP well controlled today, goal for her is < 150/90 since no documented CKD or DM.  Will consider repeat BMET at next visit and based on BP readings, restarting norvasc 5 mg

## 2013-08-27 NOTE — Patient Instructions (Signed)

## 2013-08-27 NOTE — Discharge Summary (Signed)
Family Medicine Teaching Service  Discharge Note : Attending Jeff Zyshonne Malecha MD Pager [phone] Inpatient Team Pager:  [phone]  I have reviewed this patient and the patient's chart and have discussed discharge planning with the resident at the time of discharge. I agree with the discharge plan as above.    

## 2013-08-27 NOTE — Progress Notes (Signed)
[name] is a 73 y.o. female who presents today for hospital f/u for LGIB.    Pt recently d/c from hospital for presumed lower GIB with hypotension and hematochezia.  Was seen by GI in hospital who recommend outpt colonoscopy, but the appointment was not set up. Since being d/c from the hospital, she denies any further melena, hematochezia, abdominal pain, nausea, vomiting, diarrhea, lightheadedness, dizziness, hematemesis or dyspepsia.  No pre-syncope or syncope with standing or orthostatic signs.   Past Medical History  Diagnosis Date  . History of Guillain-Barre syndrome     9/11  . Diverticulosis 11/2005    colon, history  . Gastrointestinal hemorrhage 08/2005    Not felt to be a coumadin or ASA candidate  . Peptic ulcer disease 08/2005  . Paroxysmal atrial fibrillation     prior GI bleeding- not felt to be a candidate for ASA or coumadin  . Hypertension   . Atrioventricular block, complete     s/p PPM  . Hypertrophic obstructive cardiomyopathy   . Diastolic dysfunction 01/26/2375  . UGI bleed 04/2009  . Blood transfusion abn reaction or complication, no procedure mishap     History  Smoking status  . Never Smoker   Smokeless tobacco  . Not on file    Family History  Problem Relation Age of Onset  . Transient ischemic attack Father   . COPD Father   . Hypertension Daughter   . Breast cancer Sister     Current Outpatient Prescriptions on File Prior to Visit  Medication Sig Dispense Refill  . metoprolol succinate (TOPROL-XL) 100 MG 24 hr tablet Take 1 tablet (100 mg total) by mouth daily.  90 tablet  3  . omeprazole (PRILOSEC) 20 MG capsule Take 1 capsule (20 mg total) by mouth daily.  30 capsule  1   No current facility-administered medications on file prior to visit.    ROS: Per HPI.  All other systems reviewed and are negative.   Physical Exam Filed Vitals:   08/27/13 1359  BP: 124/76  Pulse: 85  Temp: 97.7 F (36.5 C)    Physical Examination:  General appearance - alert, well appearing, and in no distress Chest - clear to auscultation, no wheezes, rales or rhonchi, symmetric air entry Heart - normal rate and regular rhythm, systolic murmur 3/6 at 2nd right intercostal space Abdomen - Soft/NT/ND, NABS, no HSM     Chemistry      Component Value Date/Time   NA 142 08/24/2013 0524   K 3.4* 08/24/2013 0524   CL 109 08/24/2013 0524   CO2 16* 08/24/2013 0524   BUN 15 08/24/2013 0524   CREATININE 0.94 08/24/2013 0524      Component Value Date/Time   CALCIUM 8.2* 08/24/2013 0524   ALKPHOS 58 08/21/2013 2304   AST 16 08/21/2013 2304   ALT 9 08/21/2013 2304   BILITOT 0.5 08/21/2013 2304

## 2013-08-28 LAB — CULTURE, BLOOD (ROUTINE X 2)
CULTURE: NO GROWTH
Culture: NO GROWTH

## 2013-09-09 ENCOUNTER — Encounter: Payer: Self-pay | Admitting: Internal Medicine

## 2013-09-23 ENCOUNTER — Encounter: Payer: Medicare Other | Admitting: *Deleted

## 2013-10-09 ENCOUNTER — Ambulatory Visit (AMBULATORY_SURGERY_CENTER): Payer: Self-pay

## 2013-10-09 ENCOUNTER — Encounter: Payer: Self-pay | Admitting: *Deleted

## 2013-10-09 VITALS — Ht 62.0 in | Wt 118.0 lb

## 2013-10-09 DIAGNOSIS — K922 Gastrointestinal hemorrhage, unspecified: Secondary | ICD-10-CM

## 2013-10-09 MED ORDER — NA SULFATE-K SULFATE-MG SULF 17.5-3.13-1.6 GM/177ML PO SOLN: 1.0000 | Freq: Once | ORAL | Status: DC

## 2013-10-20 ENCOUNTER — Ambulatory Visit (INDEPENDENT_AMBULATORY_CARE_PROVIDER_SITE_OTHER): Payer: Medicare Other | Admitting: *Deleted

## 2013-10-20 DIAGNOSIS — I442 Atrioventricular block, complete: Secondary | ICD-10-CM

## 2013-10-20 DIAGNOSIS — Z950 Presence of cardiac pacemaker: Secondary | ICD-10-CM

## 2013-10-21 ENCOUNTER — Ambulatory Visit (AMBULATORY_SURGERY_CENTER): Payer: Medicare Other | Admitting: Internal Medicine

## 2013-10-21 ENCOUNTER — Encounter: Payer: Self-pay | Admitting: Internal Medicine

## 2013-10-21 VITALS — BP 140/89 | HR 73 | Temp 98.2°F | Resp 24 | Ht 62.0 in | Wt 118.0 lb

## 2013-10-21 DIAGNOSIS — D126 Benign neoplasm of colon, unspecified: Secondary | ICD-10-CM

## 2013-10-21 DIAGNOSIS — K922 Gastrointestinal hemorrhage, unspecified: Secondary | ICD-10-CM

## 2013-10-21 DIAGNOSIS — K5730 Diverticulosis of large intestine without perforation or abscess without bleeding: Secondary | ICD-10-CM

## 2013-10-21 MED ORDER — SODIUM CHLORIDE 0.9 % IV SOLN: 500.0000 mL | INTRAVENOUS | Status: DC

## 2013-10-21 NOTE — Progress Notes (Signed)
Called to room to assist during endoscopic procedure.  Patient ID and intended procedure confirmed with present staff. Received instructions for my participation in the procedure from the performing physician.  

## 2013-10-21 NOTE — Patient Instructions (Addendum)
I found and removed 3 polyps - they look benign (not cancer). You have diverticulosis - these are pockets in the lining of the colon and blood vessels in these sometimes open up and bleed. I believe that is what happened to you.  I will let you know the polyp results and any other plans (? Another colonoscopy in a few years) by mail. I believe you should be on regular iron and B12 supplements as you will not absorb these well after your stomach surgery.  I appreciate the opportunity to care for you. Gatha Mayer, MD, FACG   YOU HAD AN ENDOSCOPIC PROCEDURE TODAY AT [facility]: Refer to the procedure report that was given to you for any specific questions about what was found during the examination.  If the procedure report does not answer your questions, please call your gastroenterologist to clarify.  If you requested that your care partner not be given the details of your procedure findings, then the procedure report has been included in a sealed envelope for you to review at your convenience later.  YOU SHOULD EXPECT: Some feelings of bloating in the abdomen. Passage of more gas than usual.  Walking can help get rid of the air that was put into your GI tract during the procedure and reduce the bloating. If you had a lower endoscopy (such as a colonoscopy or flexible sigmoidoscopy) you may notice spotting of blood in your stool or on the toilet paper. If you underwent a bowel prep for your procedure, then you may not have a normal bowel movement for a few days.  DIET: Your first meal following the procedure should be a light meal and then it is ok to progress to your normal diet.  A half-sandwich or bowl of soup is an example of a good first meal.  Heavy or fried foods are harder to digest and may make you feel nauseous or bloated.  Likewise meals heavy in dairy and vegetables can cause extra gas to form and this can also increase the bloating.  Drink plenty of fluids but you  should avoid alcoholic beverages for 24 hours.  ACTIVITY: Your care partner should take you home directly after the procedure.  You should plan to take it easy, moving slowly for the rest of the day.  You can resume normal activity the day after the procedure however you should NOT DRIVE or use heavy machinery for 24 hours (because of the sedation medicines used during the test).    SYMPTOMS TO REPORT IMMEDIATELY: A gastroenterologist can be reached at any hour.  During normal business hours, 8:30 AM to 5:00 PM Monday through Friday, call [phone].  After hours and on weekends, please call the GI answering service at [phone] who will take a message and have the physician on call contact you.   Following lower endoscopy (colonoscopy or flexible sigmoidoscopy):  Excessive amounts of blood in the stool  Significant tenderness or worsening of abdominal pains  Swelling of the abdomen that is new, acute  Fever of 100F or higher    FOLLOW UP: If any biopsies were taken you will be contacted by phone or by letter within the next 1-3 weeks.  Call your gastroenterologist if you have not heard about the biopsies in 3 weeks.  Our staff will call the home number listed on your records the next business day following your procedure to check on you and address any questions or concerns that you may have  at that time regarding the information given to you following your procedure. This is a courtesy call and so if there is no answer at the home number and we have not heard from you through the emergency physician on call, we will assume that you have returned to your regular daily activities without incident.  SIGNATURES/CONFIDENTIALITY: You and/or your care partner have signed paperwork which will be entered into your electronic medical record.  These signatures attest to the fact that that the information above on your After Visit Summary has been reviewed and is understood.  Full responsibility  of the confidentiality of this discharge information lies with you and/or your care-partner.    Information on polyps and diverticulosis given to you today

## 2013-10-21 NOTE — Progress Notes (Signed)
Propofol given over incremental dosages 

## 2013-10-21 NOTE — Op Note (Signed)
[facility]  [address] [place], [id number]   COLONOSCOPY PROCEDURE REPORT  PATIENT: [name], [name]  MR#: [id number] BIRTHDATE: 16-Jun-1941 , 77  yrs. old GENDER: Female ENDOSCOPIST: Gatha Mayer, MD, [facility] PROCEDURE DATE:  10/21/2013 PROCEDURE:   Colonoscopy with biopsy and snare polypectomy First Screening Colonoscopy - Avg.  risk and is 50 yrs.  old or older - No.  Prior Negative Screening - Now for repeat screening. N/A  History of Adenoma - Now for follow-up colonoscopy & has been > or = to 3 yrs.  N/A  Polyps Removed Today? Yes. ASA CLASS:   Class III INDICATIONS:hematochezia. MEDICATIONS: propofol (Diprivan) 100mg  IV  DESCRIPTION OF PROCEDURE:   After the risks benefits and alternatives of the procedure were thoroughly explained, informed consent was obtained.  A digital rectal exam revealed no abnormalities of the rectum.   The LB [id number]  endoscope was introduced through the anus and advanced to the terminal ileum which was intubated for a short distance. No adverse events experienced.   The quality of the prep was Suprep good  The instrument was then slowly withdrawn as the colon was fully examined.  COLON FINDINGS: Three sessile polyps measuring 2, 3 and 7 mm in size were found at the cecum (2 and 3 mm)and in the ascending colon.  A polypectomy was performed with cold forceps (cecum) and with a cold snare (ascending).  The resection was complete and the polyp tissue was completely retrieved.   Severe diverticulosis was noted The finding was in the left colon.   The colon mucosa was otherwise normal.  Retroflexed views revealed no abnormalities. The time to cecum=3 minutes 10 seconds.  Withdrawal time=12 minutes 0 seconds. The scope was withdrawn and the procedure completed. COMPLICATIONS: There were no complications.  ENDOSCOPIC IMPRESSION: 1.   Three sessile polyps measuring 2, 3 and 7 mm in size were found at the cecum and in the  ascending colon; polypectomy was performed with cold forceps and with a cold snare 2.   Severe diverticulosis was noted in the left colon - I believe this was source of recent GI bleed 3.   The colon mucosa was otherwise normal  RECOMMENDATIONS: Review path and consider if she needs a routine repeat colonoscopy (she is 73).   eSigned:  Gatha Mayer, MD, [facility] 10/21/2013 11:18 AM   cc: The Patient  and Dr. Kennith Maes

## 2013-10-22 ENCOUNTER — Telehealth: Payer: Self-pay

## 2013-10-22 NOTE — Telephone Encounter (Signed)
Left a message at [phone] for the pt to call if any questions or concerns. Maw

## 2013-10-27 LAB — MDC_IDC_ENUM_SESS_TYPE_REMOTE
Battery Impedance: 182 Ohm
Battery Remaining Longevity: 100 mo
Battery Voltage: 2.78 V
Brady Statistic RV Percent Paced: 98 %
Lead Channel Pacing Threshold Amplitude: 1 V
Lead Channel Pacing Threshold Pulse Width: 0.4 ms
Lead Channel Setting Pacing Amplitude: 2.5 V
Lead Channel Setting Pacing Pulse Width: 1 ms
MDC IDC MSMT LEADCHNL RA IMPEDANCE VALUE: 67 Ohm
MDC IDC MSMT LEADCHNL RV IMPEDANCE VALUE: 456 Ohm
MDC IDC SESS DTM: 20150331145522
MDC IDC SET LEADCHNL RV SENSING SENSITIVITY: 4 mV

## 2013-11-02 ENCOUNTER — Encounter: Payer: Self-pay | Admitting: Internal Medicine

## 2013-11-02 DIAGNOSIS — Z860100 Personal history of colon polyps, unspecified: Secondary | ICD-10-CM

## 2013-11-02 HISTORY — DX: Personal history of colonic polyps: Z86.010

## 2013-11-02 HISTORY — DX: Personal history of colon polyps, unspecified: Z86.0100

## 2013-11-02 NOTE — Progress Notes (Signed)
Quick Note:  3 tubular adenomas max 7 mm Consider repeating colonoscopy 3-5 years 2018-2020 ______

## 2013-11-04 ENCOUNTER — Encounter: Payer: Self-pay | Admitting: Family Medicine

## 2013-11-04 ENCOUNTER — Encounter: Payer: Self-pay | Admitting: *Deleted

## 2013-11-17 ENCOUNTER — Encounter: Payer: Self-pay | Admitting: Internal Medicine

## 2013-11-18 ENCOUNTER — Ambulatory Visit (INDEPENDENT_AMBULATORY_CARE_PROVIDER_SITE_OTHER): Payer: Medicare Other | Admitting: Family Medicine

## 2013-11-18 ENCOUNTER — Encounter: Payer: Self-pay | Admitting: Family Medicine

## 2013-11-18 VITALS — BP 181/101 | HR 63 | Temp 98.0°F | Ht 62.0 in | Wt 115.0 lb

## 2013-11-18 DIAGNOSIS — Z8669 Personal history of other diseases of the nervous system and sense organs: Secondary | ICD-10-CM

## 2013-11-18 DIAGNOSIS — Z1239 Encounter for other screening for malignant neoplasm of breast: Secondary | ICD-10-CM

## 2013-11-18 DIAGNOSIS — I10 Essential (primary) hypertension: Secondary | ICD-10-CM

## 2013-11-18 DIAGNOSIS — Z23 Encounter for immunization: Secondary | ICD-10-CM

## 2013-11-18 DIAGNOSIS — I7090 Unspecified atherosclerosis: Secondary | ICD-10-CM

## 2013-11-18 DIAGNOSIS — R634 Abnormal weight loss: Secondary | ICD-10-CM

## 2013-11-18 DIAGNOSIS — R413 Other amnesia: Secondary | ICD-10-CM

## 2013-11-18 DIAGNOSIS — F0150 Vascular dementia without behavioral disturbance: Secondary | ICD-10-CM | POA: Insufficient documentation

## 2013-11-18 DIAGNOSIS — D509 Iron deficiency anemia, unspecified: Secondary | ICD-10-CM

## 2013-11-18 LAB — VITAMIN B12: Vitamin B-12: 392 pg/mL (ref 211–911)

## 2013-11-18 LAB — CBC
HEMATOCRIT: 40.5 % (ref 36.0–46.0)
HEMOGLOBIN: 12.9 g/dL (ref 12.0–15.0)
MCH: 24.2 pg — ABNORMAL LOW (ref 26.0–34.0)
MCHC: 31.9 g/dL (ref 30.0–36.0)
MCV: 76 fL — ABNORMAL LOW (ref 78.0–100.0)
Platelets: 398 10*3/uL (ref 150–400)
RBC: 5.33 MIL/uL — ABNORMAL HIGH (ref 3.87–5.11)
RDW: 21.5 % — AB (ref 11.5–15.5)
WBC: 7.5 10*3/uL (ref 4.0–10.5)

## 2013-11-18 LAB — COMPLETE METABOLIC PANEL WITH GFR
ALBUMIN: 4.1 g/dL (ref 3.5–5.2)
ALT: 8 U/L (ref 0–35)
AST: 19 U/L (ref 0–37)
Alkaline Phosphatase: 53 U/L (ref 39–117)
BUN: 11 mg/dL (ref 6–23)
CALCIUM: 9.2 mg/dL (ref 8.4–10.5)
CHLORIDE: 103 meq/L (ref 96–112)
CO2: 23 mEq/L (ref 19–32)
Creat: 0.93 mg/dL (ref 0.50–1.10)
GFR, Est African American: 70 mL/min
GFR, Est Non African American: 60 mL/min
GLUCOSE: 99 mg/dL (ref 70–99)
POTASSIUM: 3.4 meq/L — AB (ref 3.5–5.3)
SODIUM: 139 meq/L (ref 135–145)
Total Bilirubin: 1.9 mg/dL — ABNORMAL HIGH (ref 0.2–1.2)
Total Protein: 6.9 g/dL (ref 6.0–8.3)

## 2013-11-18 LAB — TSH: TSH: 3.429 u[IU]/mL (ref 0.350–4.500)

## 2013-11-18 MED ORDER — HYDROCHLOROTHIAZIDE 12.5 MG PO TABS: 12.5000 mg | ORAL_TABLET | Freq: Every day | ORAL | Status: DC

## 2013-11-18 NOTE — Assessment & Plan Note (Signed)
Low recent albumin.

## 2013-11-18 NOTE — Patient Instructions (Signed)
I sent in a new blood pressure medicine.  Add this to the other meds you are taking. Lots of blood work plus a scan of your head and mammograms as part of the check for weight loss and memory problems. See me in 2-3 weeks for a recheck.  Depending on the test results, I may start a memory pill

## 2013-11-19 LAB — HIV ANTIBODY (ROUTINE TESTING W REFLEX): HIV 1&2 Ab, 4th Generation: NONREACTIVE

## 2013-11-19 LAB — RPR

## 2013-11-19 NOTE — Progress Notes (Signed)
   Subjective:    Patient ID: [name], female    DOB: 05/03/1941, 73 y.o.   MRN: [id number]  HPI FU of lower GI bleed and more. Seen for lower GI bleed.  Felt to be a diverticular bleed - severe diverticulosis noted on colonoscopy and 3 sessile polyps.  Recovering uneventfully without further bleeding.  Has not been seen in some time:  FU issues include: Hypertension.  Daughter recalls she was on 2 antihypertensives, now only on one.  Believes one was stopped around the time of the LGI bleed. Low weight:  Longstanding problem and she is not at her lowest.  She did have a recent low albumin  New problem: Memory loss.  Daughter is quite concerned about progressive memory loss - seems more than usual geriatric slowness  HPDP needs some immunizations.  Has hx of Guillian barre. No tetanus or flu.  Pneumovax ok     Review of Systems     Objective:   Physical ExamHEENT normal Neck supple Gen thin but not emaciated Lungs clear Cardiac RRR with 2/6 SEM abd benign Neuro grossly normal motor and sensory.  Thought year was "61"        Assessment & Plan:

## 2013-11-19 NOTE — Assessment & Plan Note (Signed)
I am very concerned for dementia based on daughter's history and patient's response that the year was 1975.  She could hold a pretty good conversation - so the year response was surprising to me.  Will do full WU for reversible causes, see her back and likely start on aricept.

## 2013-11-19 NOTE — Assessment & Plan Note (Signed)
Reviewed immunizations.  Cannot get flu or tetanus.  Will give prevnar.

## 2013-11-19 NOTE — Assessment & Plan Note (Signed)
Recheck with recent bleed.  One of the causes of pseudo dementia is anemia

## 2013-11-19 NOTE — Assessment & Plan Note (Signed)
Needs mammo as part of this good look at her overall health.

## 2013-11-19 NOTE — Assessment & Plan Note (Signed)
Poor control  Add HCTZ 

## 2013-11-24 ENCOUNTER — Ambulatory Visit (HOSPITAL_COMMUNITY)
Admission: RE | Admit: 2013-11-24 | Discharge: 2013-11-24 | Disposition: A | Discharge status: Home / Self Care | Payer: Medicare Other | Source: Ambulatory Visit | Attending: Family Medicine | Admitting: Family Medicine

## 2013-11-24 DIAGNOSIS — R413 Other amnesia: Secondary | ICD-10-CM

## 2013-11-24 DIAGNOSIS — R51 Headache: Secondary | ICD-10-CM | POA: Insufficient documentation

## 2013-11-26 ENCOUNTER — Encounter: Payer: Self-pay | Admitting: Family Medicine

## 2013-12-01 ENCOUNTER — Ambulatory Visit (HOSPITAL_COMMUNITY): Payer: Medicare Other | Attending: Family Medicine

## 2013-12-09 ENCOUNTER — Ambulatory Visit: Payer: Medicare Other | Admitting: Family Medicine

## 2013-12-11 ENCOUNTER — Encounter: Payer: Self-pay | Admitting: Family Medicine

## 2013-12-11 ENCOUNTER — Ambulatory Visit (INDEPENDENT_AMBULATORY_CARE_PROVIDER_SITE_OTHER): Payer: Medicare Other | Admitting: Family Medicine

## 2013-12-11 ENCOUNTER — Ambulatory Visit (HOSPITAL_COMMUNITY)
Admission: RE | Admit: 2013-12-11 | Discharge: 2013-12-11 | Disposition: A | Discharge status: Home / Self Care | Payer: Medicare Other | Source: Ambulatory Visit | Attending: Family Medicine | Admitting: Family Medicine

## 2013-12-11 VITALS — BP 134/94 | HR 76 | Temp 97.6°F | Ht 62.0 in | Wt 102.2 lb

## 2013-12-11 DIAGNOSIS — R634 Abnormal weight loss: Secondary | ICD-10-CM

## 2013-12-11 DIAGNOSIS — Z1239 Encounter for other screening for malignant neoplasm of breast: Secondary | ICD-10-CM

## 2013-12-11 DIAGNOSIS — F0390 Unspecified dementia without behavioral disturbance: Secondary | ICD-10-CM

## 2013-12-11 DIAGNOSIS — Z1231 Encounter for screening mammogram for malignant neoplasm of breast: Secondary | ICD-10-CM | POA: Insufficient documentation

## 2013-12-11 MED ORDER — DONEPEZIL HCL 5 MG PO TABS: 5.0000 mg | ORAL_TABLET | Freq: Every day | ORAL | Status: DC

## 2013-12-11 NOTE — Assessment & Plan Note (Signed)
All recent labs normal.  Discussing with daughter, this seems all with her poor appetite.  Follow and encourage PO.

## 2013-12-11 NOTE — Patient Instructions (Signed)
I am as sure as I can be at this point that we are dealing with dementia. I am starting a new medicine.  Hopefully, you notice improvement.  The most common side effect is diarrhea.   If you are tolerating the medicine, I will likely increase the dose after one month. I would like to see you in three months Focus on eating more.  I don't want any more weight loss.

## 2013-12-11 NOTE — Progress Notes (Signed)
   Subjective:    Patient ID: [name], female    DOB: [birth year], 73 y.o.   MRN: [id number]  HPI Follow up.  Concern for dementia.  Last weekend has some visual hallucinations.  Labs did not point to any reversable cause.    Review of Systems     Objective:   Physical ExamNote wt.        Assessment & Plan:

## 2013-12-11 NOTE — Assessment & Plan Note (Signed)
Feel symptoms warrant labeling with this dx and start aricept.

## 2013-12-16 ENCOUNTER — Ambulatory Visit (INDEPENDENT_AMBULATORY_CARE_PROVIDER_SITE_OTHER): Payer: Medicare Other | Admitting: Internal Medicine

## 2013-12-16 ENCOUNTER — Encounter: Payer: Medicare Other | Admitting: Internal Medicine

## 2013-12-16 ENCOUNTER — Encounter: Payer: Self-pay | Admitting: Internal Medicine

## 2013-12-16 VITALS — BP 110/82 | HR 73 | Ht 63.0 in | Wt 102.4 lb

## 2013-12-16 DIAGNOSIS — I4891 Unspecified atrial fibrillation: Secondary | ICD-10-CM

## 2013-12-16 DIAGNOSIS — I4821 Permanent atrial fibrillation: Secondary | ICD-10-CM

## 2013-12-16 DIAGNOSIS — I10 Essential (primary) hypertension: Secondary | ICD-10-CM

## 2013-12-16 DIAGNOSIS — I442 Atrioventricular block, complete: Secondary | ICD-10-CM

## 2013-12-16 LAB — MDC_IDC_ENUM_SESS_TYPE_INCLINIC
Battery Impedance: 206 Ohm
Battery Voltage: 2.78 V
Brady Statistic RV Percent Paced: 97 %
Lead Channel Impedance Value: 500 Ohm
Lead Channel Impedance Value: 67 Ohm
Lead Channel Pacing Threshold Pulse Width: 1 ms
Lead Channel Setting Pacing Amplitude: 2.5 V
Lead Channel Setting Pacing Pulse Width: 1 ms
Lead Channel Setting Sensing Sensitivity: 4 mV
MDC IDC MSMT BATTERY REMAINING LONGEVITY: 100 mo
MDC IDC MSMT LEADCHNL RV PACING THRESHOLD AMPLITUDE: 0.5 V
MDC IDC SESS DTM: 20150527134946

## 2013-12-16 NOTE — Progress Notes (Signed)
PCP: Zigmund Gottron, MD  [name] is a 73 y.o. female who presents today for routine electrophysiology followup.  Since last being seen in our clinic, the patient reports doing very well. She had GI bleeding several months ago for which she was hospitalized.  She has done well recently.  Her dementia is stable.  Today, she denies symptoms of palpitations, chest pain, shortness of breath, dizziness, presyncope, or syncope.  The patient is otherwise without complaint today.   Past Medical History  Diagnosis Date  . History of Guillain-Barre syndrome     9/11  . Diverticulosis 11/2005    colon, history  . Gastrointestinal hemorrhage 08/2005    Not felt to be a coumadin or ASA candidate  . Peptic ulcer disease 08/2005  . Paroxysmal atrial fibrillation     prior GI bleeding- not felt to be a candidate for ASA or coumadin  . Hypertension   . Atrioventricular block, complete     s/p PPM  . Hypertrophic obstructive cardiomyopathy   . Diastolic dysfunction 6/94/8546  . UGI bleed 04/2009  . Blood transfusion abn reaction or complication, no procedure mishap   . Blood transfusion without reported diagnosis   . Cataract   . Heart murmur   . Personal history of colonic polyps - adenomas 11/02/2013   Past Surgical History  Procedure Laterality Date  . Vagotomy  1995  . Cardiac pacemaker placement  2003/2004    , most recent generator change 5/12, MDT  . Fetal blood transfusion    . Colonoscopy    . Upper gastrointestinal endoscopy    . Bilroth i procedure  1995    Current Outpatient Prescriptions  Medication Sig Dispense Refill  . acetaminophen (TYLENOL) 500 MG tablet Take 500 mg by mouth every 6 (six) hours as needed.      . donepezil (ARICEPT) 5 MG tablet Take 1 tablet (5 mg total) by mouth at bedtime.  30 tablet  0  . ferrous sulfate 324 (65 FE) MG TBEC Take 1 tablet by mouth daily.      . hydrochlorothiazide (HYDRODIURIL) 12.5 MG tablet Take 1 tablet (12.5 mg total) by  mouth daily.  90 tablet  3  . metoprolol succinate (TOPROL-XL) 100 MG 24 hr tablet Take 1 tablet (100 mg total) by mouth daily.  90 tablet  3   No current facility-administered medications for this visit.    Physical Exam: Filed Vitals:   12/16/13 1103  BP: 110/82  Pulse: 73  Height: 5\' 3"  (1.6 m)  Weight: 102 lb 6.4 oz (46.448 kg)    GEN- The patient is well appearing, alert and oriented x 3 today.   Head- normocephalic, atraumatic Eyes-  Sclera clear, conjunctiva pink Ears- hearing intact Oropharynx- clear Lungs- Clear to ausculation bilaterally, normal work of breathing Chest- pacemaker pocket is well healed Heart- Regular rate and rhythm (paced), 2/6 SEM LUSB GI- soft, NT, ND, + BS Extremities- no clubbing, cyanosis, no edema  Pacemaker interrogation- reviewed in detail today,  See PACEART report  Assessment and Plan:  1. Complete heart block V output reprogrammed unipolar 2.5V @1 .41msec  (threshold today 0.5V@1msec ) Normal pacemaker function See Pace Art report No changes today   2. Permanent afib Not felt to be a candidate for anticoagulation due to prior GI bleeding  3. Edema Resolved off of norvasc  4. HCM Stable  5. HTN Stable No change required today   Return in 1 year carelink every 3 months

## 2013-12-16 NOTE — Patient Instructions (Signed)
Remote monitoring is used to monitor your pacemaker from home. This monitoring reduces the number of office visits required to check your device to one time per year. It allows [place] to keep an eye on the functioning of your device to ensure it is working properly. You are scheduled for a device check from home on 03-23-2014. You may send your transmission at any time that day. If you have a wireless device, the transmission will be sent automatically. After your physician reviews your transmission, you will receive a postcard with your next transmission date.  Your physician recommends that you schedule a follow-up appointment in: 12 months with Dr.Allred

## 2013-12-17 ENCOUNTER — Encounter: Payer: Self-pay | Admitting: Internal Medicine

## 2013-12-28 ENCOUNTER — Telehealth: Payer: Self-pay | Admitting: Family Medicine

## 2013-12-28 MED ORDER — TRAZODONE HCL 50 MG PO TABS: 50.0000 mg | ORAL_TABLET | Freq: Every evening | ORAL | Status: DC | PRN

## 2013-12-28 NOTE — Telephone Encounter (Signed)
Daughter calling to say that mother is not sleeping due to change in medication for dementia.  Please advise

## 2013-12-28 NOTE — Telephone Encounter (Signed)
Cognitive function is improving and not sleeping with aricept.  Will wait another week.  If still with Sx, add hs trazodone.

## 2013-12-28 NOTE — Telephone Encounter (Signed)
Daughter called back to return provider's call.  Please contact her again to discuss mother's issue

## 2014-01-18 ENCOUNTER — Other Ambulatory Visit: Payer: Self-pay | Admitting: Family Medicine

## 2014-01-20 NOTE — Telephone Encounter (Signed)
Needs refill on donepezil.

## 2014-01-21 ENCOUNTER — Other Ambulatory Visit: Payer: Self-pay | Admitting: Internal Medicine

## 2014-01-21 NOTE — Telephone Encounter (Signed)
Spoke with patient and informed her that rx has been sent in 

## 2014-02-24 ENCOUNTER — Other Ambulatory Visit: Payer: Self-pay | Admitting: *Deleted

## 2014-02-24 MED ORDER — DONEPEZIL HCL 10 MG PO TABS: 10.0000 mg | ORAL_TABLET | Freq: Every day | ORAL | Status: DC

## 2014-03-19 ENCOUNTER — Encounter: Payer: Self-pay | Admitting: Family Medicine

## 2014-03-19 ENCOUNTER — Ambulatory Visit (INDEPENDENT_AMBULATORY_CARE_PROVIDER_SITE_OTHER): Payer: Medicare Other | Admitting: Family Medicine

## 2014-03-19 VITALS — BP 119/84 | HR 52 | Temp 97.8°F | Ht 63.0 in | Wt 101.0 lb

## 2014-03-19 DIAGNOSIS — F0390 Unspecified dementia without behavioral disturbance: Secondary | ICD-10-CM

## 2014-03-19 DIAGNOSIS — R634 Abnormal weight loss: Secondary | ICD-10-CM

## 2014-03-19 DIAGNOSIS — I10 Essential (primary) hypertension: Secondary | ICD-10-CM

## 2014-03-19 NOTE — Patient Instructions (Signed)
I am delighted that things are going well. Let's keep an eye on the weight.   She should not get a flu shot or a tetanus shot.  I am going to give her a pneumonia shot today.   See me in 6 months if things are going good.  See me in three months if the weight or memory is concern.  Remember I do have a second medication that I can add for the memory.

## 2014-03-19 NOTE — Assessment & Plan Note (Signed)
Will continue to observe.  I am not sure if one lb wt loss is sig.

## 2014-03-19 NOTE — Assessment & Plan Note (Signed)
Good control

## 2014-03-19 NOTE — Assessment & Plan Note (Signed)
Excellent response to aricept.  Will hold off adding namenda because there is no need at present.

## 2014-03-19 NOTE — Progress Notes (Signed)
   Subjective:    Patient ID: [name], female    DOB: 05/09/1941, 73 y.o.   MRN: [id number]  HPI FU two problems 1. Dementia.  Excellent response to aricept.  No side effects.  Sig improvement in cognition.  She is able to remain at home with the family. 2. Wt loss - appetite good.  No new symptoms.      Review of Systems     Objective:   Physical Exam Wt down one pound Lungs clear Cardiac RRR without g.  2/6 SEM Affect - happy and conversational today.        Assessment & Plan:

## 2014-03-23 ENCOUNTER — Ambulatory Visit (INDEPENDENT_AMBULATORY_CARE_PROVIDER_SITE_OTHER): Payer: Medicare Other | Admitting: *Deleted

## 2014-03-23 DIAGNOSIS — I442 Atrioventricular block, complete: Secondary | ICD-10-CM

## 2014-03-23 NOTE — Progress Notes (Signed)
Remote pacemaker transmission.   

## 2014-03-24 LAB — MDC_IDC_ENUM_SESS_TYPE_REMOTE
Battery Impedance: 230 Ohm
Battery Remaining Longevity: 92 mo
Date Time Interrogation Session: 20150901132615
Lead Channel Impedance Value: 429 Ohm
Lead Channel Impedance Value: 67 Ohm
Lead Channel Pacing Threshold Amplitude: 1.75 V
Lead Channel Pacing Threshold Pulse Width: 0.4 ms
Lead Channel Setting Pacing Amplitude: 2.5 V
MDC IDC MSMT BATTERY VOLTAGE: 2.78 V
MDC IDC SET LEADCHNL RV PACING PULSEWIDTH: 1 ms
MDC IDC SET LEADCHNL RV SENSING SENSITIVITY: 4 mV
MDC IDC STAT BRADY RV PERCENT PACED: 100 %

## 2014-03-31 ENCOUNTER — Encounter: Payer: Self-pay | Admitting: Cardiology

## 2014-04-22 ENCOUNTER — Encounter: Payer: Self-pay | Admitting: Internal Medicine

## 2014-06-24 ENCOUNTER — Ambulatory Visit (INDEPENDENT_AMBULATORY_CARE_PROVIDER_SITE_OTHER): Payer: Medicare Other | Admitting: *Deleted

## 2014-06-24 DIAGNOSIS — I442 Atrioventricular block, complete: Secondary | ICD-10-CM

## 2014-06-25 NOTE — Progress Notes (Signed)
Remote pacemaker transmission.   

## 2014-07-04 LAB — MDC_IDC_ENUM_SESS_TYPE_REMOTE
Battery Impedance: 230 Ohm
Battery Remaining Longevity: 93 mo
Brady Statistic RV Percent Paced: 100 %
Date Time Interrogation Session: 20151203162646
Lead Channel Impedance Value: 67 Ohm
Lead Channel Pacing Threshold Pulse Width: 0.4 ms
Lead Channel Setting Pacing Amplitude: 2.5 V
Lead Channel Setting Sensing Sensitivity: 4 mV
MDC IDC MSMT BATTERY VOLTAGE: 2.78 V
MDC IDC MSMT LEADCHNL RV IMPEDANCE VALUE: 437 Ohm
MDC IDC MSMT LEADCHNL RV PACING THRESHOLD AMPLITUDE: 1.5 V
MDC IDC SET LEADCHNL RV PACING PULSEWIDTH: 1 ms

## 2014-07-12 ENCOUNTER — Encounter: Payer: Self-pay | Admitting: Cardiology

## 2014-07-19 ENCOUNTER — Encounter: Payer: Self-pay | Admitting: Internal Medicine

## 2014-09-28 ENCOUNTER — Ambulatory Visit (INDEPENDENT_AMBULATORY_CARE_PROVIDER_SITE_OTHER): Payer: Medicare Other | Admitting: *Deleted

## 2014-09-28 ENCOUNTER — Telehealth: Payer: Self-pay | Admitting: Cardiology

## 2014-09-28 DIAGNOSIS — I442 Atrioventricular block, complete: Secondary | ICD-10-CM

## 2014-09-28 LAB — MDC_IDC_ENUM_SESS_TYPE_REMOTE
Battery Remaining Longevity: 91 mo
Brady Statistic RV Percent Paced: 100 %
Date Time Interrogation Session: 20160308174043
Lead Channel Impedance Value: 452 Ohm
Lead Channel Impedance Value: 67 Ohm
Lead Channel Pacing Threshold Amplitude: 1.125 V
Lead Channel Pacing Threshold Pulse Width: 0.4 ms
Lead Channel Setting Pacing Pulse Width: 1 ms
Lead Channel Setting Sensing Sensitivity: 4 mV
MDC IDC MSMT BATTERY IMPEDANCE: 254 Ohm
MDC IDC MSMT BATTERY VOLTAGE: 2.78 V
MDC IDC SET LEADCHNL RV PACING AMPLITUDE: 2.5 V

## 2014-09-28 NOTE — Telephone Encounter (Signed)
LMOVM reminding pt to send remote transmission.   

## 2014-09-28 NOTE — Progress Notes (Signed)
Remote pacemaker transmission.   

## 2014-09-29 ENCOUNTER — Encounter: Payer: Self-pay | Admitting: Family Medicine

## 2014-09-29 ENCOUNTER — Ambulatory Visit (INDEPENDENT_AMBULATORY_CARE_PROVIDER_SITE_OTHER): Payer: Medicare Other | Admitting: Family Medicine

## 2014-09-29 VITALS — BP 128/88 | HR 77 | Temp 98.3°F | Ht 63.0 in | Wt 109.9 lb

## 2014-09-29 DIAGNOSIS — Z13820 Encounter for screening for osteoporosis: Secondary | ICD-10-CM | POA: Insufficient documentation

## 2014-09-29 DIAGNOSIS — Z0000 Encounter for general adult medical examination without abnormal findings: Secondary | ICD-10-CM | POA: Diagnosis not present

## 2014-09-29 DIAGNOSIS — Z8739 Personal history of other diseases of the musculoskeletal system and connective tissue: Secondary | ICD-10-CM

## 2014-09-29 DIAGNOSIS — Z8639 Personal history of other endocrine, nutritional and metabolic disease: Secondary | ICD-10-CM

## 2014-09-29 DIAGNOSIS — I10 Essential (primary) hypertension: Secondary | ICD-10-CM

## 2014-09-29 DIAGNOSIS — I350 Nonrheumatic aortic (valve) stenosis: Secondary | ICD-10-CM

## 2014-09-29 DIAGNOSIS — E2839 Other primary ovarian failure: Secondary | ICD-10-CM

## 2014-09-29 DIAGNOSIS — D509 Iron deficiency anemia, unspecified: Secondary | ICD-10-CM | POA: Diagnosis not present

## 2014-09-29 DIAGNOSIS — J300 Vasomotor rhinitis: Secondary | ICD-10-CM | POA: Insufficient documentation

## 2014-09-29 DIAGNOSIS — Z7189 Other specified counseling: Secondary | ICD-10-CM | POA: Insufficient documentation

## 2014-09-29 LAB — CBC
HEMATOCRIT: 36.5 % (ref 36.0–46.0)
Hemoglobin: 11.6 g/dL — ABNORMAL LOW (ref 12.0–15.0)
MCH: 29.1 pg (ref 26.0–34.0)
MCHC: 31.8 g/dL (ref 30.0–36.0)
MCV: 91.7 fL (ref 78.0–100.0)
MPV: 10.8 fL (ref 8.6–12.4)
Platelets: 249 10*3/uL (ref 150–400)
RBC: 3.98 MIL/uL (ref 3.87–5.11)
RDW: 14.5 % (ref 11.5–15.5)
WBC: 5.5 10*3/uL (ref 4.0–10.5)

## 2014-09-29 LAB — LIPID PANEL
CHOLESTEROL: 123 mg/dL (ref 0–200)
HDL: 49 mg/dL (ref >=46)
LDL CALC: 63 mg/dL (ref 0–99)
TRIGLYCERIDES: 53 mg/dL (ref <150)
Total CHOL/HDL Ratio: 2.5 Ratio
VLDL: 11 mg/dL (ref 0–40)

## 2014-09-29 LAB — COMPREHENSIVE METABOLIC PANEL
ALBUMIN: 3.7 g/dL (ref 3.5–5.2)
ALT: 23 U/L (ref 0–35)
AST: 29 U/L (ref 0–37)
Alkaline Phosphatase: 78 U/L (ref 39–117)
BUN: 18 mg/dL (ref 6–23)
CHLORIDE: 109 meq/L (ref 96–112)
CO2: 21 mEq/L (ref 19–32)
CREATININE: 1.16 mg/dL — AB (ref 0.50–1.10)
Calcium: 8.3 mg/dL — ABNORMAL LOW (ref 8.4–10.5)
Glucose, Bld: 70 mg/dL (ref 70–99)
Potassium: 4 mEq/L (ref 3.5–5.3)
Sodium: 140 mEq/L (ref 135–145)
TOTAL PROTEIN: 6.4 g/dL (ref 6.0–8.3)
Total Bilirubin: 0.5 mg/dL (ref 0.2–1.2)

## 2014-09-29 MED ORDER — HYDROCHLOROTHIAZIDE 12.5 MG PO TABS: 12.5000 mg | ORAL_TABLET | Freq: Every day | ORAL | Status: DC

## 2014-09-29 MED ORDER — FLUTICASONE PROPIONATE 50 MCG/ACT NA SUSP: 2.0000 | Freq: Every day | NASAL | Status: DC

## 2014-09-29 NOTE — Assessment & Plan Note (Signed)
No flairs 

## 2014-09-29 NOTE — Patient Instructions (Signed)
I will call with blood test and bone density test results. See me in 6 months. I refilled the hydrochlorothiazide I sent in a new nose spray to help with the runny nose. You look good.

## 2014-09-29 NOTE — Assessment & Plan Note (Addendum)
Check lab, no evidence of bleed

## 2014-09-29 NOTE — Assessment & Plan Note (Signed)
Minor complaint but worth trial of flonase

## 2014-09-29 NOTE — Assessment & Plan Note (Signed)
No evidence of failure symptoms.

## 2014-09-29 NOTE — Assessment & Plan Note (Signed)
Check labs.  Good control

## 2014-09-29 NOTE — Assessment & Plan Note (Signed)
Will check bone density

## 2014-09-29 NOTE — Assessment & Plan Note (Signed)
Could not get firm details on zostavax - will avoid.

## 2014-09-29 NOTE — Assessment & Plan Note (Signed)
Asymptomatic and good pulses.

## 2014-09-29 NOTE — Progress Notes (Signed)
   Subjective:    Patient ID: [name], female    DOB: 1941-04-28, 74 y.o.   MRN: [id number]  HPI  Annual exam in patient with underlying dementia.   Complaints include insomnia and loose stools Problems: BP good control on current meds which she tolerates well. No behavioral concerns witht he dementia. No symptoms suggesting GI bleed. HPDP, never had dexa scan.  Given that she is non obese, will check bone density.      Review of Systems Denies SOB, chest pain, syncope, fall.     Objective:   Physical ExamGen lively, talkative and healthy appearing. HEENT normal Neck supple without masses Lungs clear Cardiac slow, regular (likely paced rhythm.  2/6 SEM Abd benign Ext no edema.        Assessment & Plan:

## 2014-10-04 ENCOUNTER — Encounter: Payer: Self-pay | Admitting: Cardiology

## 2014-10-06 ENCOUNTER — Ambulatory Visit
Admission: RE | Admit: 2014-10-06 | Discharge: 2014-10-06 | Disposition: A | Discharge status: Home / Self Care | Payer: Medicare Other | Source: Ambulatory Visit | Attending: Family Medicine | Admitting: Family Medicine

## 2014-10-06 DIAGNOSIS — E2839 Other primary ovarian failure: Secondary | ICD-10-CM

## 2014-10-06 DIAGNOSIS — M810 Age-related osteoporosis without current pathological fracture: Secondary | ICD-10-CM | POA: Diagnosis not present

## 2014-10-07 ENCOUNTER — Encounter: Payer: Self-pay | Admitting: Family Medicine

## 2014-10-07 ENCOUNTER — Encounter: Payer: Self-pay | Admitting: Internal Medicine

## 2014-10-07 DIAGNOSIS — M810 Age-related osteoporosis without current pathological fracture: Secondary | ICD-10-CM | POA: Insufficient documentation

## 2014-10-07 MED ORDER — CALCIUM CITRATE-VITAMIN D 250-100 MG-UNIT PO TABS: 1.0000 | ORAL_TABLET | Freq: Two times a day (BID) | ORAL | Status: AC

## 2014-10-07 NOTE — Progress Notes (Signed)
Patient ID: [name], female   DOB: 1941/04/23, 74 y.o.   MRN: [id number] Added calcium bid due to bone density.  No bisphosphanate for now due to GERD.

## 2014-10-08 ENCOUNTER — Ambulatory Visit (INDEPENDENT_AMBULATORY_CARE_PROVIDER_SITE_OTHER): Payer: Medicare Other | Admitting: Family Medicine

## 2014-10-08 ENCOUNTER — Encounter: Payer: Self-pay | Admitting: Family Medicine

## 2014-10-08 VITALS — BP 122/78 | HR 67 | Temp 98.1°F | Ht 63.0 in | Wt 107.1 lb

## 2014-10-08 DIAGNOSIS — B351 Tinea unguium: Secondary | ICD-10-CM | POA: Diagnosis not present

## 2014-10-08 NOTE — Progress Notes (Signed)
Patient ID: [name], female   DOB: 1941-07-17, 74 y.o.   MRN: [id number] Addendum only to print AVS

## 2014-10-08 NOTE — Assessment & Plan Note (Signed)
All nails trimmed

## 2014-10-08 NOTE — Patient Instructions (Signed)
Please try to keep nails filed.  I will be happy to trim regularly.  Remind me when needed.

## 2014-10-08 NOTE — Progress Notes (Signed)
   Subjective:    Patient ID: [name], female    DOB: 1940-11-18, 74 y.o.   MRN: [id number]  HPI Doing well.  Only issue today is to trim long, thick toenails.  She cannot reach herself.    Review of Systems     Objective:   Physical ExamMultiple toenails with onychomycosis.  All nails bot feet trimmed.        Assessment & Plan:

## 2014-12-22 ENCOUNTER — Encounter: Payer: Self-pay | Admitting: Internal Medicine

## 2014-12-22 ENCOUNTER — Ambulatory Visit (INDEPENDENT_AMBULATORY_CARE_PROVIDER_SITE_OTHER): Payer: Medicare Other | Admitting: Internal Medicine

## 2014-12-22 ENCOUNTER — Other Ambulatory Visit: Payer: Self-pay

## 2014-12-22 VITALS — BP 102/80 | HR 61 | Ht 63.0 in | Wt 110.4 lb

## 2014-12-22 DIAGNOSIS — I421 Obstructive hypertrophic cardiomyopathy: Secondary | ICD-10-CM

## 2014-12-22 DIAGNOSIS — I482 Chronic atrial fibrillation: Secondary | ICD-10-CM

## 2014-12-22 DIAGNOSIS — I10 Essential (primary) hypertension: Secondary | ICD-10-CM | POA: Diagnosis not present

## 2014-12-22 DIAGNOSIS — I442 Atrioventricular block, complete: Secondary | ICD-10-CM | POA: Diagnosis not present

## 2014-12-22 DIAGNOSIS — I4821 Permanent atrial fibrillation: Secondary | ICD-10-CM

## 2014-12-22 LAB — CUP PACEART INCLINIC DEVICE CHECK
Battery Remaining Longevity: 90 mo
Battery Voltage: 2.78 V
Brady Statistic AP VP Percent: 0 %
Brady Statistic AP VS Percent: 0 %
Brady Statistic AS VP Percent: 0 %
Brady Statistic AS VS Percent: 0 %
Date Time Interrogation Session: 20160601135207
Lead Channel Impedance Value: 489 Ohm
Lead Channel Pacing Threshold Amplitude: 0.75 V
Lead Channel Pacing Threshold Pulse Width: 0.4 ms
Lead Channel Sensing Intrinsic Amplitude: 2 mV
Lead Channel Setting Pacing Amplitude: 2 V
Lead Channel Setting Pacing Amplitude: 2.5 V
Lead Channel Setting Pacing Pulse Width: 1 ms
Lead Channel Setting Sensing Sensitivity: 4 mV
MDC IDC MSMT BATTERY IMPEDANCE: 277 Ohm
MDC IDC MSMT LEADCHNL RA IMPEDANCE VALUE: 67 Ohm
MDC IDC MSMT LEADCHNL RV PACING THRESHOLD AMPLITUDE: 0.75 V
MDC IDC MSMT LEADCHNL RV PACING THRESHOLD PULSEWIDTH: 1 ms

## 2014-12-22 MED ORDER — METOPROLOL SUCCINATE ER 50 MG PO TB24: 50.0000 mg | ORAL_TABLET | Freq: Every day | ORAL | Status: DC

## 2014-12-22 NOTE — Patient Instructions (Signed)
Medication Instructions:  Your physician has recommended you make the following change in your medication:  1) Decrease Metoprolol to 50 mg daily   Labwork: None ordered  Testing/Procedures: None ordered  Follow-Up: Remote monitoring is used to monitor your Pacemaker or ICD from home. This monitoring reduces the number of office visits required to check your device to one time per year. It allows [place] to keep an eye on the functioning of your device to ensure it is working properly. You are scheduled for a device check from home on 03/23/15. You may send your transmission at any time that day. If you have a wireless device, the transmission will be sent automatically. After your physician reviews your transmission, you will receive a postcard with your next transmission date.  Your physician wants you to follow-up in: 12 months with Sonya Marshall, NP You will receive a reminder letter in the mail two months in advance. If you don't receive a letter, please call our office to schedule the follow-up appointment.   Any Other Special Instructions Will Be Listed Below (If Applicable).

## 2014-12-23 ENCOUNTER — Encounter: Payer: Self-pay | Admitting: Internal Medicine

## 2014-12-23 NOTE — Progress Notes (Signed)
PCP: Zigmund Gottron, MD  [name] is a 74 y.o. female who presents today for routine electrophysiology followup.  Since last being seen in our clinic, the patient reports doing very well. She has done well recently.  Her dementia is stable. She is not on anticoagulation due to prior GI bleeding.  Today, she denies symptoms of palpitations, chest pain, shortness of breath, dizziness, presyncope, or syncope.  The patient is otherwise without complaint today.   Past Medical History  Diagnosis Date  . History of Guillain-Barre syndrome     9/11  . Diverticulosis 11/2005    colon, history  . Gastrointestinal hemorrhage 08/2005    Not felt to be a coumadin or ASA candidate  . Peptic ulcer disease 08/2005  . Paroxysmal atrial fibrillation     prior GI bleeding- not felt to be a candidate for ASA or coumadin  . Hypertension   . Atrioventricular block, complete     s/p PPM  . Hypertrophic obstructive cardiomyopathy   . Diastolic dysfunction 03/01/1750  . UGI bleed 04/2009  . Blood transfusion abn reaction or complication, no procedure mishap   . Blood transfusion without reported diagnosis   . Cataract   . Heart murmur   . Personal history of colonic polyps - adenomas 11/02/2013   Past Surgical History  Procedure Laterality Date  . Vagotomy  1995  . Cardiac pacemaker placement  2003/2004    , most recent generator change 5/12, MDT  . Fetal blood transfusion    . Colonoscopy    . Upper gastrointestinal endoscopy    . Bilroth i procedure  1995    Current Outpatient Prescriptions  Medication Sig Dispense Refill  . acetaminophen (TYLENOL) 500 MG tablet Take 500 mg by mouth every 6 (six) hours as needed (pain).     . calcium-vitamin D 250-100 MG-UNIT per tablet Take 1 tablet by mouth 2 (two) times daily.    Marland Kitchen donepezil (ARICEPT) 10 MG tablet Take 1 tablet (10 mg total) by mouth at bedtime. 30 tablet 12  . ferrous sulfate 324 (65 FE) MG TBEC Take 1 tablet by mouth daily.    .  hydrochlorothiazide (HYDRODIURIL) 12.5 MG tablet Take 1 tablet (12.5 mg total) by mouth daily. 90 tablet 3  . metoprolol succinate (TOPROL-XL) 50 MG 24 hr tablet Take 1 tablet (50 mg total) by mouth daily. Take with or immediately following a meal. 90 tablet 3   No current facility-administered medications for this visit.    Physical Exam: Filed Vitals:   12/22/14 1200  BP: 102/80  Pulse: 61  Height: 5\' 3"  (1.6 m)  Weight: 50.077 kg (110 lb 6.4 oz)    GEN- The patient is well appearing, alert and oriented x 3 today.   Head- normocephalic, atraumatic Eyes-  Sclera clear, conjunctiva pink Ears- hearing intact Oropharynx- clear Lungs- Clear to ausculation bilaterally, normal work of breathing Chest- pacemaker pocket is well healed Heart- Regular rate and rhythm (paced), 2/6 SEM LUSB GI- soft, NT, ND, + BS Extremities- no clubbing, cyanosis, no edema  Pacemaker interrogation- reviewed in detail today,  See PACEART report  Assessment and Plan:  1. Complete heart block V output reprogrammed unipolar 2.5V @1 .32msec  (threshold today 0.5V@1msec ) Normal pacemaker function See Pace Art report No changes today  2. Permanent afib Not felt to be a candidate for anticoagulation due to prior GI bleeding Today we discussed left atrial appendage occlusion (Watchman) as a possible option.  She is willing to consider this alternative. Her  chads2vasc score is at least 4.  3. Edema Resolved off of norvasc  4. HCM Stable  5. HTN Stable No change required today   Return in 1 year to see EP NP carelink every 3 months

## 2015-01-03 ENCOUNTER — Other Ambulatory Visit: Payer: Self-pay | Admitting: Internal Medicine

## 2015-03-23 ENCOUNTER — Ambulatory Visit (INDEPENDENT_AMBULATORY_CARE_PROVIDER_SITE_OTHER): Payer: Medicare Other | Admitting: *Deleted

## 2015-03-23 DIAGNOSIS — I442 Atrioventricular block, complete: Secondary | ICD-10-CM

## 2015-03-23 NOTE — Progress Notes (Signed)
Remote pacemaker transmission.   

## 2015-04-01 ENCOUNTER — Other Ambulatory Visit: Payer: Self-pay | Admitting: Internal Medicine

## 2015-04-01 LAB — CUP PACEART REMOTE DEVICE CHECK
Brady Statistic AP VP Percent: 83.5 %
Brady Statistic AP VS Percent: 0.1 %
Brady Statistic AS VP Percent: 16.5 %
Brady Statistic AS VS Percent: 0.1 %
Date Time Interrogation Session: 20160909084411
Lead Channel Impedance Value: 365 Ohm
Lead Channel Impedance Value: 508 Ohm
Lead Channel Pacing Threshold Amplitude: 0.75 V
Lead Channel Pacing Threshold Amplitude: 1 V
Lead Channel Pacing Threshold Pulse Width: 0.4 ms
Lead Channel Pacing Threshold Pulse Width: 0.4 ms

## 2015-04-02 ENCOUNTER — Other Ambulatory Visit: Payer: Self-pay | Admitting: Family Medicine

## 2015-04-12 ENCOUNTER — Encounter: Payer: Self-pay | Admitting: Cardiology

## 2015-04-27 ENCOUNTER — Encounter: Payer: Self-pay | Admitting: Internal Medicine

## 2015-06-02 ENCOUNTER — Telehealth: Payer: Self-pay | Admitting: Nurse Practitioner

## 2015-06-02 NOTE — Telephone Encounter (Signed)
Left message for patient or daughter to call back to discuss Watchman procedure. Dr Rayann Heman discussed at appt 12/2014 and pt and daughter were interested in procedure at that time.  Pt would need TEE with either Dr Meda Coffee, Johnsie Cancel, or Hilty to evaluate LAA anatomy.  We have availability for Watchman implant 06/23/15 with Drs Rayann Heman and Burt Knack if they would like to proceed.  If they would like office visit to discuss, I could see patient Monday 11/14 with Dr Rayann Heman.   Please route message to Janan Halter, RN when patient/daughter calls back as I am out of the office the rest of the week.  Chanetta Marshall, NP 06/02/2015 8:38 AM

## 2015-06-23 ENCOUNTER — Ambulatory Visit (INDEPENDENT_AMBULATORY_CARE_PROVIDER_SITE_OTHER): Payer: Medicare Other | Admitting: *Deleted

## 2015-06-23 ENCOUNTER — Telehealth: Payer: Self-pay | Admitting: Cardiology

## 2015-06-23 DIAGNOSIS — I442 Atrioventricular block, complete: Secondary | ICD-10-CM

## 2015-06-23 NOTE — Telephone Encounter (Signed)
Confirmed remote transmission w/ pt daughter.   

## 2015-06-27 NOTE — Progress Notes (Signed)
Remote pacemaker transmission.   

## 2015-07-06 LAB — CUP PACEART REMOTE DEVICE CHECK
Battery Impedance: 400 Ohm
Battery Remaining Longevity: 66 mo
Brady Statistic AP VP Percent: 82 %
Brady Statistic AS VP Percent: 18 %
Date Time Interrogation Session: 20161202194737
Implantable Lead Location: 753859
Implantable Lead Model: 5076
Implantable Lead Model: 5076
Lead Channel Impedance Value: 356 Ohm
Lead Channel Setting Pacing Amplitude: 2 V
Lead Channel Setting Pacing Pulse Width: 1 ms
Lead Channel Setting Sensing Sensitivity: 4 mV
MDC IDC LEAD IMPLANT DT: 20031226
MDC IDC LEAD IMPLANT DT: 20031226
MDC IDC LEAD LOCATION: 753860
MDC IDC MSMT BATTERY VOLTAGE: 2.78 V
MDC IDC MSMT LEADCHNL RV IMPEDANCE VALUE: 487 Ohm
MDC IDC SET LEADCHNL RV PACING AMPLITUDE: 2.5 V
MDC IDC STAT BRADY AP VS PERCENT: 0 %
MDC IDC STAT BRADY AS VS PERCENT: 0 %

## 2015-07-08 ENCOUNTER — Encounter: Payer: Self-pay | Admitting: Cardiology

## 2015-09-26 ENCOUNTER — Encounter: Payer: Medicare Other | Admitting: *Deleted

## 2015-09-30 ENCOUNTER — Encounter: Payer: Self-pay | Admitting: Cardiology

## 2015-10-05 ENCOUNTER — Ambulatory Visit: Payer: Self-pay | Admitting: Family Medicine

## 2015-10-10 ENCOUNTER — Ambulatory Visit (INDEPENDENT_AMBULATORY_CARE_PROVIDER_SITE_OTHER): Payer: Medicare Other | Admitting: *Deleted

## 2015-10-10 DIAGNOSIS — I442 Atrioventricular block, complete: Secondary | ICD-10-CM | POA: Diagnosis not present

## 2015-10-13 ENCOUNTER — Ambulatory Visit (INDEPENDENT_AMBULATORY_CARE_PROVIDER_SITE_OTHER): Payer: Medicare Other | Admitting: Family Medicine

## 2015-10-13 ENCOUNTER — Encounter: Payer: Self-pay | Admitting: Family Medicine

## 2015-10-13 VITALS — BP 137/91 | HR 89 | Temp 98.2°F | Ht 63.0 in | Wt 116.9 lb

## 2015-10-13 DIAGNOSIS — I700 Atherosclerosis of aorta: Secondary | ICD-10-CM | POA: Diagnosis not present

## 2015-10-13 DIAGNOSIS — I482 Chronic atrial fibrillation: Secondary | ICD-10-CM

## 2015-10-13 DIAGNOSIS — I10 Essential (primary) hypertension: Secondary | ICD-10-CM

## 2015-10-13 DIAGNOSIS — F0390 Unspecified dementia without behavioral disturbance: Secondary | ICD-10-CM

## 2015-10-13 DIAGNOSIS — I5032 Chronic diastolic (congestive) heart failure: Secondary | ICD-10-CM

## 2015-10-13 DIAGNOSIS — I70209 Unspecified atherosclerosis of native arteries of extremities, unspecified extremity: Secondary | ICD-10-CM

## 2015-10-13 DIAGNOSIS — D509 Iron deficiency anemia, unspecified: Secondary | ICD-10-CM | POA: Diagnosis not present

## 2015-10-13 DIAGNOSIS — I4821 Permanent atrial fibrillation: Secondary | ICD-10-CM

## 2015-10-13 DIAGNOSIS — IMO0001 Reserved for inherently not codable concepts without codable children: Secondary | ICD-10-CM

## 2015-10-13 DIAGNOSIS — I7090 Unspecified atherosclerosis: Secondary | ICD-10-CM

## 2015-10-13 DIAGNOSIS — F0150 Vascular dementia without behavioral disturbance: Secondary | ICD-10-CM

## 2015-10-13 LAB — CBC
HCT: 37.9 % (ref 36.0–46.0)
Hemoglobin: 12.3 g/dL (ref 12.0–15.0)
MCH: 29.1 pg (ref 26.0–34.0)
MCHC: 32.5 g/dL (ref 30.0–36.0)
MCV: 89.6 fL (ref 78.0–100.0)
MPV: 11.6 fL (ref 8.6–12.4)
Platelets: 260 10*3/uL (ref 150–400)
RBC: 4.23 MIL/uL (ref 3.87–5.11)
RDW: 13.4 % (ref 11.5–15.5)
WBC: 8.2 10*3/uL (ref 4.0–10.5)

## 2015-10-13 LAB — COMPLETE METABOLIC PANEL WITH GFR
ALT: 12 U/L (ref 6–29)
AST: 20 U/L (ref 10–35)
Albumin: 3.8 g/dL (ref 3.6–5.1)
Alkaline Phosphatase: 63 U/L (ref 33–130)
BUN: 20 mg/dL (ref 7–25)
CO2: 25 mmol/L (ref 20–31)
Calcium: 9.4 mg/dL (ref 8.6–10.4)
Chloride: 101 mmol/L (ref 98–110)
Creat: 1.5 mg/dL — ABNORMAL HIGH (ref 0.60–0.93)
GFR, Est African American: 38 mL/min — ABNORMAL LOW (ref >=60)
GFR, Est Non African American: 33 mL/min — ABNORMAL LOW (ref >=60)
Glucose, Bld: 98 mg/dL (ref 65–99)
Potassium: 4 mmol/L (ref 3.5–5.3)
Sodium: 137 mmol/L (ref 135–146)
Total Bilirubin: 0.9 mg/dL (ref 0.2–1.2)
Total Protein: 7.1 g/dL (ref 6.1–8.1)

## 2015-10-13 LAB — TSH: TSH: 1.39 mIU/L

## 2015-10-13 NOTE — Patient Instructions (Signed)
I will call with blood test results. See me in six months. I am delighted that things are going so well. Stay on all the same medicines.

## 2015-10-13 NOTE — Assessment & Plan Note (Signed)
Reviewed echo of 2014.  Sclerosis without stenosis.

## 2015-10-14 NOTE — Assessment & Plan Note (Signed)
No complaints of bleeding.  Will check labs.

## 2015-10-14 NOTE — Assessment & Plan Note (Signed)
Small vessel disease/vascular dementia stable with nice control of risk factors.  Check labs.

## 2015-10-14 NOTE — Progress Notes (Signed)
Remote pacemaker transmission.   

## 2015-10-14 NOTE — Assessment & Plan Note (Signed)
With pacemaker.  Not on anticoag due to gi bleed.

## 2015-10-14 NOTE — Assessment & Plan Note (Signed)
Stable on current meds 

## 2015-10-14 NOTE — Progress Notes (Signed)
   Subjective:    Patient ID: [name], female    DOB: 1941-03-24, 75 y.o.   MRN: [id number]  HPI [name] is actually doing quite well.  Her chief complaint is that she wants her toenails trimmed.  I has been a year since I last saw her.  Issues Dementia: Stable.  Still performs all ADLs Daughter indicates progression of memory loss is very slow.  Tolerating aricept well Cardiac.  Has pacemaker for permanent A fib/junctional rhythym.  Recently seen by cards.  She is OK.  Has diastolic dysfunction CHF.  Denies SOB, orthopnea and pedal edema.  Does eat low salt diet.  Also has Aortic sclerosis without stenosis.  No chest pain or syncope.  Hx of lower GI bleed - not on anticoag for permanent a fib. Wt is stable.  Eating OK HBP tolerating meds well Anemia from chronic blood loss.  No recent CBC    Review of Systems Denies appetite change, bowel or bladder change.  No worrisome skin problems.     Objective:   Physical Exam  VS noted Neck no bruit Lungs clear Cardiac RRR with 1/6 SEM Abd benign Ext no edema.  Onychomycosis of toenails which were all trimmed.        Assessment & Plan:

## 2015-11-09 LAB — CUP PACEART REMOTE DEVICE CHECK
Battery Impedance: 448 Ohm
Battery Remaining Longevity: 63 mo
Battery Voltage: 2.77 V
Brady Statistic AP VP Percent: 80 %
Brady Statistic AS VP Percent: 20 %
Brady Statistic AS VS Percent: 0 %
Date Time Interrogation Session: 20170320180327
Implantable Lead Implant Date: 20031226
Implantable Lead Implant Date: 20031226
Implantable Lead Location: 753859
Lead Channel Impedance Value: 487 Ohm
Lead Channel Pacing Threshold Amplitude: 0.875 V
Lead Channel Pacing Threshold Amplitude: 1.5 V
Lead Channel Pacing Threshold Pulse Width: 0.4 ms
Lead Channel Pacing Threshold Pulse Width: 0.4 ms
Lead Channel Sensing Intrinsic Amplitude: 2.8 mV
Lead Channel Setting Pacing Amplitude: 2.5 V
Lead Channel Setting Pacing Pulse Width: 1 ms
Lead Channel Setting Sensing Sensitivity: 2.8 mV
MDC IDC LEAD LOCATION: 753860
MDC IDC MSMT LEADCHNL RA IMPEDANCE VALUE: 356 Ohm
MDC IDC SET LEADCHNL RA PACING AMPLITUDE: 2 V
MDC IDC STAT BRADY AP VS PERCENT: 0 %

## 2015-11-11 ENCOUNTER — Encounter: Payer: Self-pay | Admitting: Cardiology

## 2015-12-09 ENCOUNTER — Other Ambulatory Visit: Payer: Self-pay | Admitting: Family Medicine

## 2015-12-29 NOTE — Progress Notes (Signed)
This encounter was created in error - please disregard.

## 2015-12-30 ENCOUNTER — Encounter: Payer: Self-pay | Admitting: Nurse Practitioner

## 2016-01-16 ENCOUNTER — Other Ambulatory Visit: Payer: Self-pay | Admitting: Internal Medicine

## 2016-01-31 NOTE — Progress Notes (Addendum)
Electrophysiology Office Note Date: 02/01/2016  ID:  Kambra Rojo, DOB 06/04/1941, MRN [id number]  PCP: Zigmund Gottron, MD Electrophysiologist: Rayann Heman  CC: Pacemaker follow-up  [name] is a 75 y.o. female seen today for Dr Rayann Heman.  She presents today for routine electrophysiology followup.  Since last being seen in our clinic, the patient reports doing very well.  She denies chest pain, palpitations, dyspnea, PND, orthopnea, nausea, vomiting, dizziness, syncope, edema, weight gain, or early satiety.  Device History: MDT dual chamber PPM implanted 2003 for complete heart block; gen change 2012   Past Medical History  Diagnosis Date  . History of Guillain-Barre syndrome     9/11  . Diverticulosis 11/2005    colon, history  . Gastrointestinal hemorrhage 08/2005    Not felt to be a coumadin or ASA candidate  . Peptic ulcer disease 08/2005  . Permanent atrial fibrillation (HCC)     prior GI bleeding- not felt to be a candidate for ASA or coumadin, chadsvasc score is at least 4  . Hypertension   . Atrioventricular block, complete (HCC)     s/p PPM  . Hypertrophic obstructive cardiomyopathy   . Diastolic dysfunction 123XX123  . UGI bleed 04/2009  . Blood transfusion abn reaction or complication, no procedure mishap   . Blood transfusion without reported diagnosis   . Cataract   . Heart murmur   . Personal history of colonic polyps - adenomas 11/02/2013   Past Surgical History  Procedure Laterality Date  . Vagotomy  1995  . Cardiac pacemaker placement  2003/2004    , most recent generator change 5/12, MDT  . Fetal blood transfusion    . Colonoscopy    . Upper gastrointestinal endoscopy    . Bilroth i procedure  1995    Current Outpatient Prescriptions  Medication Sig Dispense Refill  . acetaminophen (TYLENOL) 500 MG tablet Take 500 mg by mouth every 6 (six) hours as needed (pain).     . calcium-vitamin D 250-100 MG-UNIT per tablet Take 1 tablet by  mouth 2 (two) times daily.    Marland Kitchen donepezil (ARICEPT) 10 MG tablet TAKE ONE TABLET BY MOUTH AT BEDTIME 90 tablet 3  . ferrous sulfate 324 (65 FE) MG TBEC Take 1 tablet by mouth daily.    . hydrochlorothiazide (MICROZIDE) 12.5 MG capsule TAKE ONE CAPSULE BY MOUTH ONCE DAILY 90 capsule 3  . metoprolol succinate (TOPROL-XL) 50 MG 24 hr tablet TAKE ONE TABLET BY MOUTH ONCE DAILY. TAKE WITH OR IMMEDIATELY FOLLOWING A MEAL. 90 tablet 0   No current facility-administered medications for this visit.    Allergies:   Aspirin and Orphenadrine citrate   Social History: Social History   Social History  . Marital Status: Divorced    Spouse Name: N/A  . Number of Children: N/A  . Years of Education: N/A   Occupational History  . Not on file.   Social History Main Topics  . Smoking status: Never Smoker   . Smokeless tobacco: Not on file  . Alcohol Use: No  . Drug Use: No  . Sexual Activity: Not on file   Other Topics Concern  . Not on file   Social History Narrative    Family History: Family History  Problem Relation Age of Onset  . Transient ischemic attack Father   . COPD Father   . Hypertension Daughter   . Breast cancer Sister   . Colon cancer Neg Hx   . Pancreatic cancer Neg Hx   .  Stomach cancer Neg Hx      Review of Systems: All other systems reviewed and are otherwise negative except as noted above.   Physical Exam: VS:  BP 122/68 mmHg  Pulse 75  Ht 5' (1.524 m)  Wt 115 lb 12.8 oz (52.527 kg)  BMI 22.62 kg/m2 , BMI Body mass index is 22.62 kg/(m^2).  GEN- The patient is elderly and thin appearing, alert and oriented x 3 today.   HEENT: normocephalic, atraumatic; sclera clear, conjunctiva pink; hearing intact; oropharynx clear; neck supple Lungs- Clear to ausculation bilaterally, normal work of breathing.  No wheezes, rales, rhonchi Heart- Regular rate and rhythm (paced) GI- soft, non-tender, non-distended, bowel sounds present Extremities- no clubbing, cyanosis,  or edema; DP/PT/radial pulses 2+ bilaterally MS- no significant deformity or atrophy Skin- warm and dry, no rash or lesion; PPM pocket well healed Psych- euthymic mood, full affect Neuro- strength and sensation are intact  PPM Interrogation- reviewed in detail today,  See PACEART report  EKG:  EKG is ordered today. The ekg ordered today shows atrial fibrillation with ventricular pacing   Recent Labs: 10/13/2015: ALT 12; BUN 20; Creat 1.50*; Hemoglobin 12.3; Platelets 260; Potassium 4.0; Sodium 137; TSH 1.39   Wt Readings from Last 3 Encounters:  02/01/16 115 lb 12.8 oz (52.527 kg)  10/13/15 116 lb 14.4 oz (53.025 kg)  12/22/14 110 lb 6.4 oz (50.077 kg)     Other studies Reviewed: Additional studies/ records that were reviewed today include: Dr Jackalyn Lombard office notes  Assessment and Plan:  1.  Complete heart block Normal PPM function - pt device dependent today  See Pace Art report No changes today Given [facility] Smart monitor today   2.  Persistent atrial fibrillation CHADS2VASC is 4 Not on OAC 2/2 GI bleeding Dr Rayann Heman has discussed Watchman in the past. Today, I have discussed again with her and her daughter.  They are interested in proceeding but would like to discuss with other son and daughter.  I have given them my card and asked them to call if they would like to proceed.   3.  HCM Stable No change required today  4.  HTN Stable No change required today   Current medicines are reviewed at length with the patient today.   The patient does not have concerns regarding her medicines.  The following changes were made today:  none  Labs/ tests ordered today include:  Orders Placed This Encounter  Procedures  . EKG 12-Lead     Disposition:   Follow up with [facility], Dr Rayann Heman 1 year    Signed, Chanetta Marshall, NP 02/01/2016 8:50 AM  [facility] [address] [place] [address] [phone] (office) [phone] (fax)

## 2016-02-01 ENCOUNTER — Encounter: Payer: Self-pay | Admitting: Nurse Practitioner

## 2016-02-01 ENCOUNTER — Ambulatory Visit (INDEPENDENT_AMBULATORY_CARE_PROVIDER_SITE_OTHER): Payer: Medicare Other | Admitting: Nurse Practitioner

## 2016-02-01 VITALS — BP 122/68 | HR 75 | Ht 60.0 in | Wt 115.8 lb

## 2016-02-01 DIAGNOSIS — I442 Atrioventricular block, complete: Secondary | ICD-10-CM | POA: Diagnosis not present

## 2016-02-01 DIAGNOSIS — I481 Persistent atrial fibrillation: Secondary | ICD-10-CM

## 2016-02-01 DIAGNOSIS — I4819 Other persistent atrial fibrillation: Secondary | ICD-10-CM

## 2016-02-01 DIAGNOSIS — I422 Other hypertrophic cardiomyopathy: Secondary | ICD-10-CM

## 2016-02-01 DIAGNOSIS — I10 Essential (primary) hypertension: Secondary | ICD-10-CM

## 2016-02-01 LAB — CUP PACEART INCLINIC DEVICE CHECK
Date Time Interrogation Session: 20170712085339
Implantable Lead Implant Date: 20031226
Implantable Lead Model: 5076
MDC IDC LEAD IMPLANT DT: 20031226
MDC IDC LEAD LOCATION: 753859
MDC IDC LEAD LOCATION: 753860

## 2016-02-01 NOTE — Patient Instructions (Addendum)
Medication Instructions:   Your physician recommends that you continue on your current medications as directed. Please refer to the Current Medication list given to you today.   If you need a refill on your cardiac medications before your next appointment, please call your pharmacy.  Labwork: NONE ORDER TODAY    Testing/Procedures:  NONE ORDER TODAY    Follow-Up: Your physician wants you to follow-up in: [place] will receive a reminder letter in the mail two months in advance. If you don't receive a letter, please call our office to schedule the follow-up appointment.   Remote monitoring is used to monitor your Pacemaker of ICD from home. This monitoring reduces the number of office visits required to check your device to one time per year. It allows [place] to keep an eye on the functioning of your device to ensure it is working properly. You are scheduled for a device check from home on . 05/03/2016.Marland KitchenMarland KitchenYou may send your transmission at any time that day. If you have a wireless device, the transmission will be sent automatically. After your physician reviews your transmission, you will receive a postcard with your next transmission date.     Any Other Special Instructions Will Be Listed Below (If Applicable).

## 2016-02-20 ENCOUNTER — Encounter: Payer: Self-pay | Admitting: Internal Medicine

## 2016-03-07 ENCOUNTER — Ambulatory Visit (INDEPENDENT_AMBULATORY_CARE_PROVIDER_SITE_OTHER): Payer: Medicare Other | Admitting: Family Medicine

## 2016-03-07 ENCOUNTER — Encounter: Payer: Self-pay | Admitting: Family Medicine

## 2016-03-07 DIAGNOSIS — I5032 Chronic diastolic (congestive) heart failure: Secondary | ICD-10-CM

## 2016-03-07 DIAGNOSIS — D509 Iron deficiency anemia, unspecified: Secondary | ICD-10-CM | POA: Diagnosis not present

## 2016-03-07 DIAGNOSIS — I10 Essential (primary) hypertension: Secondary | ICD-10-CM

## 2016-03-07 DIAGNOSIS — I70209 Unspecified atherosclerosis of native arteries of extremities, unspecified extremity: Secondary | ICD-10-CM

## 2016-03-07 DIAGNOSIS — Z8669 Personal history of other diseases of the nervous system and sense organs: Secondary | ICD-10-CM

## 2016-03-07 DIAGNOSIS — I4821 Permanent atrial fibrillation: Secondary | ICD-10-CM

## 2016-03-07 DIAGNOSIS — F0150 Vascular dementia without behavioral disturbance: Secondary | ICD-10-CM

## 2016-03-07 DIAGNOSIS — Z23 Encounter for immunization: Secondary | ICD-10-CM | POA: Diagnosis not present

## 2016-03-07 DIAGNOSIS — I7090 Unspecified atherosclerosis: Secondary | ICD-10-CM

## 2016-03-07 DIAGNOSIS — I482 Chronic atrial fibrillation: Secondary | ICD-10-CM | POA: Diagnosis not present

## 2016-03-07 LAB — CBC
HCT: 35.8 % (ref 35.0–45.0)
HEMOGLOBIN: 11.6 g/dL — AB (ref 11.7–15.5)
MCH: 29.8 pg (ref 27.0–33.0)
MCHC: 32.4 g/dL (ref 32.0–36.0)
MCV: 92 fL (ref 80.0–100.0)
MPV: 11.1 fL (ref 7.5–12.5)
Platelets: 339 10*3/uL (ref 140–400)
RBC: 3.89 MIL/uL (ref 3.80–5.10)
RDW: 13.9 % (ref 11.0–15.0)
WBC: 7.8 10*3/uL (ref 3.8–10.8)

## 2016-03-07 LAB — BASIC METABOLIC PANEL WITH GFR
BUN: 27 mg/dL — ABNORMAL HIGH (ref 7–25)
CALCIUM: 9.2 mg/dL (ref 8.6–10.4)
CO2: 26 mmol/L (ref 20–31)
Chloride: 104 mmol/L (ref 98–110)
Creat: 1.86 mg/dL — ABNORMAL HIGH (ref 0.60–0.93)
GFR, EST AFRICAN AMERICAN: 30 mL/min — AB (ref >=60)
GFR, EST NON AFRICAN AMERICAN: 26 mL/min — AB (ref >=60)
Glucose, Bld: 87 mg/dL (ref 65–99)
Potassium: 4.1 mmol/L (ref 3.5–5.3)
SODIUM: 141 mmol/L (ref 135–146)

## 2016-03-07 MED ORDER — METOPROLOL SUCCINATE ER 50 MG PO TB24: ORAL_TABLET | ORAL | 3 refills | Status: DC

## 2016-03-07 NOTE — Assessment & Plan Note (Signed)
Give prevnar.

## 2016-03-07 NOTE — Assessment & Plan Note (Signed)
Stable and rate controled.  No anticoag due to hx of GI bleed.

## 2016-03-07 NOTE — Assessment & Plan Note (Signed)
Check FE

## 2016-03-07 NOTE — Assessment & Plan Note (Signed)
Stable on current meds 

## 2016-03-07 NOTE — Assessment & Plan Note (Signed)
Good control

## 2016-03-07 NOTE — Patient Instructions (Signed)
You look great.  I am glad things are going well. We will check and likely give you a pneumonia shot today.   I will call with the blood test results. I am sorry about the confusion on the wellness visit.

## 2016-03-07 NOTE — Progress Notes (Signed)
   Subjective:    Patient ID: [name], female    DOB: 1941/04/27, 75 y.o.   MRN: [id number]  HPI Doing well.  Thought she was here for annual wellness visit.  Explained how to make appointment.  Issues. 1. Wt nicely stable. 2. Dementia is minimally/slowly progressive. 3. B+HBP is good control 4. Concern for wt loss - I am reassured by recently stable wt.   5. Hx of fe defiicent anemia.  No recent check.  No noted GI blood loss.   6. Diastolic CHF.  Denies DOE, ankle swelling.   7. Needs pneumococcal vaccination.  Reviewed.  Not contraindicated with hx of Guillian Barre.      Review of Systems     Objective:   Physical Exam Lungs clear Cardiac normal rate, mildly irregular.. 1/6 SEM No peripheral edema.       Assessment & Plan:

## 2016-03-09 ENCOUNTER — Encounter: Payer: Self-pay | Admitting: Family Medicine

## 2016-03-09 ENCOUNTER — Other Ambulatory Visit: Payer: Self-pay | Admitting: Family Medicine

## 2016-03-09 DIAGNOSIS — N182 Chronic kidney disease, stage 2 (mild): Secondary | ICD-10-CM | POA: Insufficient documentation

## 2016-03-16 ENCOUNTER — Other Ambulatory Visit: Payer: Self-pay

## 2016-03-20 ENCOUNTER — Ambulatory Visit
Admission: RE | Admit: 2016-03-20 | Discharge: 2016-03-20 | Disposition: A | Discharge status: Home / Self Care | Payer: Medicare Other | Source: Ambulatory Visit | Attending: Family Medicine | Admitting: Family Medicine

## 2016-03-20 DIAGNOSIS — N182 Chronic kidney disease, stage 2 (mild): Secondary | ICD-10-CM

## 2016-03-20 DIAGNOSIS — N281 Cyst of kidney, acquired: Secondary | ICD-10-CM | POA: Diagnosis not present

## 2016-03-21 ENCOUNTER — Ambulatory Visit (INDEPENDENT_AMBULATORY_CARE_PROVIDER_SITE_OTHER): Payer: Medicare Other | Admitting: *Deleted

## 2016-03-21 ENCOUNTER — Encounter: Payer: Self-pay | Admitting: *Deleted

## 2016-03-21 VITALS — BP 128/77 | HR 85 | Ht 62.0 in | Wt 120.2 lb

## 2016-03-21 DIAGNOSIS — Z0000 Encounter for general adult medical examination without abnormal findings: Secondary | ICD-10-CM | POA: Diagnosis not present

## 2016-03-21 NOTE — Patient Instructions (Addendum)
It was a pleasure meeting you and [name] today! You are up-to-date on your recommended preventive screenings. We discussed your risk factors including vision problems, hearing problems, fall risk and lack of exercise. Your cognitive screening also was low today.  I've set up an appointment for you to follow up with Dr. Andria Frames in October as he requested. Be sure to mention your hearing and cognitive screening at that visit.  Have a great day! I hope to see you again next year for a Medicare Wellness Visit.  [name]

## 2016-03-21 NOTE — Progress Notes (Signed)
Subjective:   [name] is a 75 y.o. female who presents for an Initial Medicare Annual Wellness Visit. Her daughter [name] is here to help answer questions for her.  Review of Systems    Cardiac Risk Factors include: advanced age (>67men, >25 women);sedentary lifestyle     Objective:    Today's Vitals   03/21/16 1354  BP: 128/77  Pulse: 85  SpO2: 93%  Weight: 120 lb 3.2 oz (54.5 kg)  Height: 5\' 2"  (1.575 m)   Body mass index is 21.98 kg/m.   Current Medications (verified) Outpatient Encounter Prescriptions as of 03/21/2016  Medication Sig  . calcium-vitamin D 250-100 MG-UNIT per tablet Take 1 tablet by mouth 2 (two) times daily.  Marland Kitchen donepezil (ARICEPT) 10 MG tablet TAKE ONE TABLET BY MOUTH AT BEDTIME  . ferrous sulfate 324 (65 FE) MG TBEC Take 1 tablet by mouth daily.  . hydrochlorothiazide (MICROZIDE) 12.5 MG capsule TAKE ONE CAPSULE BY MOUTH ONCE DAILY  . metoprolol succinate (TOPROL-XL) 50 MG 24 hr tablet TAKE ONE TABLET BY MOUTH ONCE DAILY. TAKE WITH OR IMMEDIATELY FOLLOWING A MEAL.  Marland Kitchen acetaminophen (TYLENOL) 500 MG tablet Take 500 mg by mouth every 6 (six) hours as needed (pain).    No facility-administered encounter medications on file as of 03/21/2016.     Allergies (verified) Aspirin and Orphenadrine citrate   History: Past Medical History:  Diagnosis Date  . Atrioventricular block, complete (HCC)    s/p PPM  . Blood transfusion abn reaction or complication, no procedure mishap   . Blood transfusion without reported diagnosis   . Cataract   . Diastolic dysfunction 123XX123  . Diverticulosis 11/2005   colon, history  . Gastrointestinal hemorrhage 08/2005   Not felt to be a coumadin or ASA candidate  . Heart murmur   . History of Guillain-Barre syndrome    9/11  . Hypertension   . Hypertrophic obstructive cardiomyopathy   . Peptic ulcer disease 08/2005  . Permanent atrial fibrillation (HCC)    prior GI bleeding- not felt to be a candidate for ASA  or coumadin, chadsvasc score is at least 4  . Personal history of colonic polyps - adenomas 11/02/2013  . UGI bleed 04/2009   Past Surgical History:  Procedure Laterality Date  . [place]  . CARDIAC PACEMAKER PLACEMENT  2003/2004   , most recent generator change 5/12, MDT  . COLONOSCOPY    . FETAL BLOOD TRANSFUSION    . UPPER GASTROINTESTINAL ENDOSCOPY    . VAGOTOMY  1995   Family History  Problem Relation Age of Onset  . Transient ischemic attack Father   . COPD Father   . Hypertension Daughter   . Breast cancer Sister   . Colon cancer Neg Hx   . Pancreatic cancer Neg Hx   . Stomach cancer Neg Hx    Social History   Occupational History  . Not on file.   Social History Main Topics  . Smoking status: Never Smoker  . Smokeless tobacco: Never Used  . Alcohol use No  . Drug use: No  . Sexual activity: No    Tobacco Counseling Counseling given: Not Answered Never smoker  Activities of Daily Living In your present state of health, do you have any difficulty performing the following activities: 03/21/2016  Hearing? N  Vision? Y  Difficulty concentrating or making decisions? Y  Walking or climbing stairs? Y  Dressing or bathing? N  Doing errands, shopping? Y  Conservation officer, nature  and eating ? Y  Using the Toilet? N  In the past six months, have you accidently leaked urine? N  Do you have problems with loss of bowel control? N  Managing your Medications? N  Managing your Finances? Y  Housekeeping or managing your Housekeeping? Y  Some recent data might be hidden   Home Safety Smoke Alarms? Yes Non-slip mats in bath/shower? No Halls free from clutter wires and cords? Yes Reports home does not have throw rugs or stairs. Encouraged [name] and [name] to get non-slip mats for bathroom.  Immunizations and Health Maintenance Immunization History  Administered Date(s) Administered  . Pneumococcal Conjugate-13 11/18/2013, 03/07/2016  . Td 09/21/2002    There are no preventive care reminders to display for this patient.  Patient Care Team: Sonya Resides, MD as PCP - General (Family Medicine) Sonya Grayer, MD (Cardiology)  Indicate any recent Medical Services you may have received from other than [facility] providers in the past year (date may be approximate).     Assessment:   This is a routine wellness examination for [name].   Hearing/Vision screen  Hearing Screening   125Hz  250Hz  500Hz  1000Hz  2000Hz  3000Hz  4000Hz  6000Hz  8000Hz   Right ear:   Fail Fail 40  40    Left ear:   Fail Fail 40  Fail      Dietary issues and exercise activities discussed: Current Exercise Habits: The patient does not participate in regular exercise at present, Exercise limited by: orthopedic condition(s)  Goals    None    [name] does not report having any goals. She does not like to exercise, and has no interests.   Depression Screen PHQ 2/9 Scores 03/21/2016 03/07/2016 10/13/2015 10/08/2014 09/29/2014 03/19/2014 12/11/2013  PHQ - 2 Score 1 0 0 0 0 0 0    Fall Risk Fall Risk  03/21/2016 03/07/2016 10/13/2015 10/08/2014 09/29/2014  Falls in the past year? No No No No No  Risk for fall due to : Impaired balance/gait;Impaired mobility;Impaired vision - - - -  TUG Test: 28 seconds  Cognitive Function: Mini-Cog: 0/5 Followed by Dr. Andria [name] for dementia   Screening Tests Health Maintenance  Topic Date Due  . COLONOSCOPY  10/21/2016  . PNA vac Low Risk Adult (2 of 2 - PPSV23) 03/07/2017  . DEXA SCAN  Completed      Plan:  We discussed her mini-cog score, TUG test, hearing screening and reported vision problems.   Scheduled follow-up appointment with Dr. Andria [name] for the end of October.   During the course of the visit, [name] was educated and counseled about the following appropriate screening and preventive services:   Vaccines to include Pneumoccal  Cardiovascular disease screening  Colorectal cancer screening  Glaucoma screening  Nutrition  counseling  Patient Instructions (the written plan) were given to the patient.    [name], [name]   03/21/2016

## 2016-03-28 ENCOUNTER — Encounter: Payer: Self-pay | Admitting: *Deleted

## 2016-03-28 NOTE — Progress Notes (Signed)
Patient ID: [name], female   DOB: Nov 14, 1940, 75 y.o.   MRN: [id number] I have reviewed this visit and discussed with [facility], and agree with her documentation.

## 2016-05-03 ENCOUNTER — Ambulatory Visit (INDEPENDENT_AMBULATORY_CARE_PROVIDER_SITE_OTHER): Payer: Medicare Other | Admitting: *Deleted

## 2016-05-03 DIAGNOSIS — I442 Atrioventricular block, complete: Secondary | ICD-10-CM

## 2016-05-03 NOTE — Progress Notes (Signed)
Remote pacemaker transmission.   

## 2016-05-04 ENCOUNTER — Encounter: Payer: Self-pay | Admitting: Cardiology

## 2016-05-16 ENCOUNTER — Ambulatory Visit: Payer: Self-pay | Admitting: Family Medicine

## 2016-05-31 ENCOUNTER — Encounter: Payer: Self-pay | Admitting: Family Medicine

## 2016-05-31 ENCOUNTER — Ambulatory Visit (INDEPENDENT_AMBULATORY_CARE_PROVIDER_SITE_OTHER): Payer: Medicare Other | Admitting: Family Medicine

## 2016-05-31 DIAGNOSIS — I10 Essential (primary) hypertension: Secondary | ICD-10-CM | POA: Diagnosis not present

## 2016-05-31 DIAGNOSIS — N184 Chronic kidney disease, stage 4 (severe): Secondary | ICD-10-CM | POA: Insufficient documentation

## 2016-05-31 DIAGNOSIS — N183 Chronic kidney disease, stage 3 unspecified: Secondary | ICD-10-CM

## 2016-05-31 LAB — BASIC METABOLIC PANEL WITH GFR
BUN: 25 mg/dL (ref 7–25)
CHLORIDE: 105 mmol/L (ref 98–110)
CO2: 24 mmol/L (ref 20–31)
CREATININE: 1.83 mg/dL — AB (ref 0.60–0.93)
Calcium: 9.5 mg/dL (ref 8.6–10.4)
GFR, Est African American: 30 mL/min — ABNORMAL LOW (ref >=60)
GFR, Est Non African American: 26 mL/min — ABNORMAL LOW (ref >=60)
Glucose, Bld: 99 mg/dL (ref 65–99)
POTASSIUM: 4 mmol/L (ref 3.5–5.3)
SODIUM: 139 mmol/L (ref 135–146)

## 2016-05-31 LAB — LIPID PANEL
CHOL/HDL RATIO: 3.4 ratio (ref <5.0)
CHOLESTEROL: 169 mg/dL (ref <200)
HDL: 49 mg/dL — ABNORMAL LOW (ref >50)
LDL Cholesterol: 99 mg/dL
TRIGLYCERIDES: 103 mg/dL (ref <150)
VLDL: 21 mg/dL (ref <30)

## 2016-05-31 NOTE — Progress Notes (Signed)
   Subjective:    Patient ID: [name], female    DOB: 28-Jul-1940, 75 y.o.   MRN: [id number]  HPI Patient returns for a creat check.  Has had slowly worsening creat presumably based on medical renal disease (hypertension).  BP control is good.  Patient states no trouble voiding, no pain, dysuria, nocturia, urgency.    Over one year since last cholesterol    Review of Systems     Objective:   Physical Exam Abd benign. No CVA tenderness.        Assessment & Plan:

## 2016-05-31 NOTE — Assessment & Plan Note (Signed)
Has progressed to stage 3 with GFR = 30.  Recheck.  Apparently does not drink many fluids during the day.  Low grade dehydration may play some role.

## 2016-05-31 NOTE — Assessment & Plan Note (Signed)
Good control

## 2016-05-31 NOTE — Patient Instructions (Signed)
Things look good. Blood pressure and weight are both good. I do want you to drink more water - don't rely just on thirst.   Make sure you drink a glass with breakfast mid morning, with lunch, mid afternoon, with dinner and in the evening.  The water will help your kidneys.   I will call tomorrow with the blood test results.

## 2016-06-01 LAB — CUP PACEART REMOTE DEVICE CHECK
Battery Impedance: 598 Ohm
Battery Remaining Longevity: 55 mo
Battery Voltage: 2.77 V
Brady Statistic AP VP Percent: 80 %
Implantable Lead Location: 753859
Implantable Lead Location: 753860
Implantable Lead Model: 5076
Implantable Lead Model: 5076
Implantable Pulse Generator Implant Date: 20120520
Lead Channel Impedance Value: 347 Ohm
Lead Channel Pacing Threshold Pulse Width: 0.4 ms
Lead Channel Setting Pacing Amplitude: 2 V
Lead Channel Setting Pacing Amplitude: 2.5 V
Lead Channel Setting Pacing Pulse Width: 1 ms
Lead Channel Setting Sensing Sensitivity: 2.8 mV
MDC IDC LEAD IMPLANT DT: 20031226
MDC IDC LEAD IMPLANT DT: 20031226
MDC IDC MSMT LEADCHNL RA PACING THRESHOLD AMPLITUDE: 0.875 V
MDC IDC MSMT LEADCHNL RA PACING THRESHOLD PULSEWIDTH: 0.4 ms
MDC IDC MSMT LEADCHNL RV IMPEDANCE VALUE: 468 Ohm
MDC IDC MSMT LEADCHNL RV PACING THRESHOLD AMPLITUDE: 1.375 V
MDC IDC SESS DTM: 20171012123229
MDC IDC STAT BRADY AP VS PERCENT: 0 %
MDC IDC STAT BRADY AS VP PERCENT: 20 %
MDC IDC STAT BRADY AS VS PERCENT: 0 %

## 2016-07-21 ENCOUNTER — Other Ambulatory Visit: Payer: Self-pay | Admitting: Family Medicine

## 2016-08-02 ENCOUNTER — Ambulatory Visit (INDEPENDENT_AMBULATORY_CARE_PROVIDER_SITE_OTHER): Payer: Medicare Other | Admitting: *Deleted

## 2016-08-02 ENCOUNTER — Telehealth: Payer: Self-pay | Admitting: Cardiology

## 2016-08-02 DIAGNOSIS — I442 Atrioventricular block, complete: Secondary | ICD-10-CM

## 2016-08-02 NOTE — Telephone Encounter (Signed)
Confirmed remote transmission w/ pt daughter.   

## 2016-08-02 NOTE — Progress Notes (Signed)
Remote pacemaker transmission.   

## 2016-08-03 ENCOUNTER — Encounter: Payer: Self-pay | Admitting: Cardiology

## 2016-08-13 LAB — CUP PACEART REMOTE DEVICE CHECK
Brady Statistic AP VS Percent: 0 %
Brady Statistic AS VS Percent: 0 %
Implantable Lead Implant Date: 20031226
Implantable Lead Implant Date: 20031226
Implantable Lead Location: 753859
Implantable Lead Model: 5076
Lead Channel Impedance Value: 523 Ohm
Lead Channel Pacing Threshold Amplitude: 1.375 V
Lead Channel Pacing Threshold Pulse Width: 0.4 ms
Lead Channel Pacing Threshold Pulse Width: 0.4 ms
Lead Channel Setting Sensing Sensitivity: 2.8 mV
MDC IDC LEAD LOCATION: 753860
MDC IDC MSMT BATTERY IMPEDANCE: 647 Ohm
MDC IDC MSMT BATTERY REMAINING LONGEVITY: 54 mo
MDC IDC MSMT BATTERY VOLTAGE: 2.77 V
MDC IDC MSMT LEADCHNL RA IMPEDANCE VALUE: 348 Ohm
MDC IDC MSMT LEADCHNL RA PACING THRESHOLD AMPLITUDE: 0.875 V
MDC IDC PG IMPLANT DT: 20120520
MDC IDC SESS DTM: 20180111170019
MDC IDC SET LEADCHNL RA PACING AMPLITUDE: 2 V
MDC IDC SET LEADCHNL RV PACING AMPLITUDE: 2.5 V
MDC IDC SET LEADCHNL RV PACING PULSEWIDTH: 1 ms
MDC IDC STAT BRADY AP VP PERCENT: 79 %
MDC IDC STAT BRADY AS VP PERCENT: 20 %

## 2016-11-01 ENCOUNTER — Telehealth: Payer: Self-pay | Admitting: Cardiology

## 2016-11-01 ENCOUNTER — Ambulatory Visit (INDEPENDENT_AMBULATORY_CARE_PROVIDER_SITE_OTHER): Payer: Medicare Other | Admitting: *Deleted

## 2016-11-01 DIAGNOSIS — I442 Atrioventricular block, complete: Secondary | ICD-10-CM

## 2016-11-01 NOTE — Telephone Encounter (Signed)
LMOVM reminding pt to send remote transmission.   

## 2016-11-01 NOTE — Progress Notes (Signed)
Remote pacemaker transmission.   

## 2016-11-02 ENCOUNTER — Encounter: Payer: Self-pay | Admitting: Cardiology

## 2016-11-02 LAB — CUP PACEART REMOTE DEVICE CHECK
Battery Impedance: 724 Ohm
Battery Remaining Longevity: 50 mo
Battery Voltage: 2.77 V
Brady Statistic AP VS Percent: 0 %
Brady Statistic AS VP Percent: 22 %
Implantable Lead Implant Date: 20031226
Implantable Lead Model: 5076
Implantable Lead Model: 5076
Implantable Pulse Generator Implant Date: 20120520
Lead Channel Impedance Value: 351 Ohm
Lead Channel Pacing Threshold Amplitude: 0.75 V
Lead Channel Pacing Threshold Pulse Width: 0.4 ms
Lead Channel Setting Pacing Amplitude: 2.5 V
MDC IDC LEAD IMPLANT DT: 20031226
MDC IDC LEAD LOCATION: 753859
MDC IDC LEAD LOCATION: 753860
MDC IDC MSMT LEADCHNL RA SENSING INTR AMPL: 2.8 mV
MDC IDC MSMT LEADCHNL RV IMPEDANCE VALUE: 471 Ohm
MDC IDC MSMT LEADCHNL RV PACING THRESHOLD AMPLITUDE: 1 V
MDC IDC MSMT LEADCHNL RV PACING THRESHOLD PULSEWIDTH: 0.4 ms
MDC IDC SESS DTM: 20180412180437
MDC IDC SET LEADCHNL RA PACING AMPLITUDE: 2 V
MDC IDC SET LEADCHNL RV PACING PULSEWIDTH: 1 ms
MDC IDC SET LEADCHNL RV SENSING SENSITIVITY: 2.8 mV
MDC IDC STAT BRADY AP VP PERCENT: 78 %
MDC IDC STAT BRADY AS VS PERCENT: 0 %

## 2016-12-11 ENCOUNTER — Encounter: Payer: Self-pay | Admitting: Internal Medicine

## 2016-12-16 ENCOUNTER — Other Ambulatory Visit: Payer: Self-pay | Admitting: Family Medicine

## 2016-12-18 NOTE — Telephone Encounter (Signed)
2nd request.  Martin, Tamika L, RN  

## 2016-12-21 ENCOUNTER — Emergency Department (HOSPITAL_COMMUNITY): Payer: Medicare Other

## 2016-12-21 ENCOUNTER — Encounter (HOSPITAL_COMMUNITY): Payer: Self-pay | Admitting: Emergency Medicine

## 2016-12-21 ENCOUNTER — Emergency Department (HOSPITAL_COMMUNITY)
Admission: EM | Admit: 2016-12-21 | Discharge: 2016-12-21 | Disposition: A | Discharge status: Home / Self Care | Payer: Medicare Other | Attending: Emergency Medicine | Admitting: Emergency Medicine

## 2016-12-21 DIAGNOSIS — N3000 Acute cystitis without hematuria: Secondary | ICD-10-CM | POA: Diagnosis not present

## 2016-12-21 DIAGNOSIS — E860 Dehydration: Secondary | ICD-10-CM | POA: Diagnosis not present

## 2016-12-21 DIAGNOSIS — N189 Chronic kidney disease, unspecified: Secondary | ICD-10-CM | POA: Insufficient documentation

## 2016-12-21 DIAGNOSIS — I129 Hypertensive chronic kidney disease with stage 1 through stage 4 chronic kidney disease, or unspecified chronic kidney disease: Secondary | ICD-10-CM | POA: Diagnosis not present

## 2016-12-21 DIAGNOSIS — N179 Acute kidney failure, unspecified: Secondary | ICD-10-CM | POA: Insufficient documentation

## 2016-12-21 DIAGNOSIS — Z950 Presence of cardiac pacemaker: Secondary | ICD-10-CM | POA: Insufficient documentation

## 2016-12-21 DIAGNOSIS — I4891 Unspecified atrial fibrillation: Secondary | ICD-10-CM | POA: Diagnosis not present

## 2016-12-21 DIAGNOSIS — Z79899 Other long term (current) drug therapy: Secondary | ICD-10-CM | POA: Insufficient documentation

## 2016-12-21 DIAGNOSIS — R404 Transient alteration of awareness: Secondary | ICD-10-CM | POA: Diagnosis not present

## 2016-12-21 DIAGNOSIS — J9811 Atelectasis: Secondary | ICD-10-CM | POA: Diagnosis not present

## 2016-12-21 DIAGNOSIS — R531 Weakness: Secondary | ICD-10-CM

## 2016-12-21 LAB — I-STAT TROPONIN, ED: Troponin i, poc: 0 ng/mL (ref 0.00–0.08)

## 2016-12-21 LAB — COMPREHENSIVE METABOLIC PANEL
ALT: 21 U/L (ref 14–54)
AST: 25 U/L (ref 15–41)
Albumin: 3.7 g/dL (ref 3.5–5.0)
Alkaline Phosphatase: 63 U/L (ref 38–126)
Anion gap: 12 (ref 5–15)
BUN: 40 mg/dL — ABNORMAL HIGH (ref 6–20)
CHLORIDE: 103 mmol/L (ref 101–111)
CO2: 25 mmol/L (ref 22–32)
CREATININE: 2.11 mg/dL — AB (ref 0.44–1.00)
Calcium: 10.3 mg/dL (ref 8.9–10.3)
GFR calc non Af Amer: 21 mL/min — ABNORMAL LOW (ref >60)
GFR, EST AFRICAN AMERICAN: 25 mL/min — AB (ref >60)
Glucose, Bld: 97 mg/dL (ref 65–99)
POTASSIUM: 4 mmol/L (ref 3.5–5.1)
SODIUM: 140 mmol/L (ref 135–145)
Total Bilirubin: 0.9 mg/dL (ref 0.3–1.2)
Total Protein: 7.6 g/dL (ref 6.5–8.1)

## 2016-12-21 LAB — URINALYSIS, ROUTINE W REFLEX MICROSCOPIC
BILIRUBIN URINE: NEGATIVE
Glucose, UA: NEGATIVE mg/dL
HGB URINE DIPSTICK: NEGATIVE
Ketones, ur: 5 mg/dL — AB
NITRITE: NEGATIVE
PH: 5 (ref 5.0–8.0)
Protein, ur: NEGATIVE mg/dL
SPECIFIC GRAVITY, URINE: 1.026 (ref 1.005–1.030)

## 2016-12-21 LAB — CBC WITH DIFFERENTIAL/PLATELET
Basophils Absolute: 0 K/uL (ref 0.0–0.1)
Basophils Relative: 0 %
Eosinophils Absolute: 0.1 K/uL (ref 0.0–0.7)
Eosinophils Relative: 1 %
HCT: 46.1 % — ABNORMAL HIGH (ref 36.0–46.0)
Hemoglobin: 14.4 g/dL (ref 12.0–15.0)
Lymphocytes Relative: 8 %
Lymphs Abs: 0.9 K/uL (ref 0.7–4.0)
MCH: 29.5 pg (ref 26.0–34.0)
MCHC: 31.2 g/dL (ref 30.0–36.0)
MCV: 94.5 fL (ref 78.0–100.0)
Monocytes Absolute: 0.4 K/uL (ref 0.1–1.0)
Monocytes Relative: 3 %
Neutro Abs: 9.5 K/uL — ABNORMAL HIGH (ref 1.7–7.7)
Neutrophils Relative %: 88 %
Platelets: 255 K/uL (ref 150–400)
RBC: 4.88 MIL/uL (ref 3.87–5.11)
RDW: 13.4 % (ref 11.5–15.5)
WBC: 10.9 K/uL — ABNORMAL HIGH (ref 4.0–10.5)

## 2016-12-21 MED ORDER — CEFTRIAXONE SODIUM 1 G IJ SOLR
1.0000 g | Freq: Once | INTRAMUSCULAR | Status: AC
  Administered 2016-12-21: 1 g via INTRAVENOUS
  Filled 2016-12-21: qty 10

## 2016-12-21 MED ORDER — SODIUM CHLORIDE 0.9 % IV BOLUS (SEPSIS)
1000.0000 mL | Freq: Once | INTRAVENOUS | Status: AC
  Administered 2016-12-21: 1000 mL via INTRAVENOUS

## 2016-12-21 MED ORDER — CEPHALEXIN 500 MG PO CAPS: 500.0000 mg | ORAL_CAPSULE | Freq: Two times a day (BID) | ORAL | 0 refills | Status: DC

## 2016-12-21 NOTE — Discharge Instructions (Signed)
Your creatinine was elevated at 2.11 today. Please increase your fluid intake at home. Please follow-up with her primary care provider. Please take your antibiotics twice a day until complete.

## 2016-12-21 NOTE — ED Triage Notes (Signed)
BIB [facility] from home; pt lives with daughter.  Pt was walking down hallway at home and appeared weak, so she sat her down.  Has had cold-like symptoms x 3 days.  Pt has baseline dementia and doesn't eat normally anyway.  Pt has pacemaker, family unsure of type.  Per EMS she was very slow and somewhat unsteady while walking to stretcher.

## 2016-12-21 NOTE — ED Provider Notes (Signed)
By signing my name below, I, Margit Banda, attest that this documentation has been prepared under the direction and in the presence of Fuquan Wilson, Delice Bison, DO. Electronically Signed: Margit Banda, ED Scribe. 12/21/16. 3:05 AM.  TIME SEEN: 2:59 AM  CHIEF COMPLAINT: Weakness  HPI: [name] is a 76 y.o. female with a PMHx of dementia, and has a pacemaker for third-degree heart block, hypertension, atrial fibrillation not on anticoagulation who presents to the Emergency Department complaining of moderate generalized weakness that started earlier today. Per EMS, she was very slow and somewhat unsteady when walking to stretcher. Pt lives with her daughter. Associated sx include a nonproductive cough for the past 2-3 days. She isn't in any pain. She doesn't eat or drink a lot, normally. Last ate or drank ~ 9 pm. No recent falls or injuries. She has a cane that she uses occasionally. Pt denies fever, nausea, vomiting, and diarrhea. Denies chest pain or shortness of breath. No numbness, tingling or focal weakness.  ROS: Level V caveat secondary to dementia  PAST MEDICAL HISTORY/PAST SURGICAL HISTORY:  Past Medical History:  Diagnosis Date  . Atrioventricular block, complete (HCC)    s/p PPM  . Blood transfusion abn reaction or complication, no procedure mishap   . Blood transfusion without reported diagnosis   . Cataract   . Diastolic dysfunction 8/75/6433  . Diverticulosis 11/2005   colon, history  . Gastrointestinal hemorrhage 08/2005   Not felt to be a coumadin or ASA candidate  . Heart murmur   . History of Guillain-Barre syndrome    9/11  . Hypertension   . Hypertr obst cardiomyop   . Peptic ulcer disease 08/2005  . Permanent atrial fibrillation (HCC)    prior GI bleeding- not felt to be a candidate for ASA or coumadin, chadsvasc score is at least 4  . Personal history of colonic polyps - adenomas 11/02/2013  . UGI bleed 04/2009   MEDICATIONS:  Prior to Admission medications    Medication Sig Start Date End Date Taking? Authorizing Provider  acetaminophen (TYLENOL) 500 MG tablet Take 500 mg by mouth every 6 (six) hours as needed (pain).     [provider]  calcium-vitamin D 250-100 MG-UNIT per tablet Take 1 tablet by mouth 2 (two) times daily. 10/07/14   Zenia Resides, MD  donepezil (ARICEPT) 10 MG tablet TAKE ONE TABLET BY MOUTH AT BEDTIME 07/25/16   Zenia Resides, MD  ferrous sulfate 324 (65 FE) MG TBEC Take 1 tablet by mouth daily.    [provider]  hydrochlorothiazide (MICROZIDE) 12.5 MG capsule TAKE ONE CAPSULE BY MOUTH ONCE DAILY 12/18/16   Zenia Resides, MD  metoprolol succinate (TOPROL-XL) 50 MG 24 hr tablet TAKE ONE TABLET BY MOUTH ONCE DAILY. TAKE WITH OR IMMEDIATELY FOLLOWING A MEAL. 03/07/16   Zenia Resides, MD   ALLERGIES:  Allergies  Allergen Reactions  . Aspirin     REACTION: doesn't take due to trouble w/ ulcers  . Orphenadrine Citrate     UNKNOWN   SOCIAL HISTORY:  Social History  Substance Use Topics  . Smoking status: Never Smoker  . Smokeless tobacco: Never Used  . Alcohol use No   FAMILY HISTORY: Family History  Problem Relation Age of Onset  . Transient ischemic attack Father   . COPD Father   . Hypertension Daughter   . Breast cancer Sister   . Colon cancer Neg Hx   . Pancreatic cancer Neg Hx   . Stomach cancer  Neg Hx    EXAM: BP (!) 125/95 (BP Location: Right Arm)   Pulse 60   Temp 97.6 F (36.4 C) (Oral)   Resp 17   Ht 5\' 2"  (1.575 m)   Wt 118 lb (53.5 kg)   SpO2 98%   BMI 21.58 kg/m    CONSTITUTIONAL: Alert and pleasantly demented and responds appropriately to questions. Chronically ill-appearing, well-nourished, Elderly, Thin, in no distress, afebrile, pleasant HEAD: Normocephalic, atraumatic EYES: Conjunctivae clear, pupils appear equal, EOMI ENT: normal nose; moist mucous membranes NECK: Supple, no meningismus, no nuchal rigidity, no LAD  CARD: RRR; S1 and S2 appreciated; no  murmurs, no clicks, no rubs, no gallops RESP: Normal chest excursion without splinting or tachypnea; breath sounds clear and equal bilaterally; no wheezes, no rhonchi, no rales, no hypoxia or respiratory distress, speaking full sentences ABD/GI: Normal bowel sounds; non-distended; soft, non-tender, no rebound, no guarding, no peritoneal signs, no hepatosplenomegaly BACK:  The back appears normal and is non-tender to palpation, there is no CVA tenderness EXT: Normal ROM in all joints; non-tender to palpation; no edema; normal capillary refill; no cyanosis, no calf tenderness or swelling    SKIN: Normal color for age and race; warm; no rash NEURO: Moves all extremities equally, no pronator drift, strength 5/5 in all 4 extremities, cranial nerves II through XII intact, normal speech, sensation to light touch intact diffusely PSYCH: The patient's mood and manner are appropriate. Grooming and personal hygiene are appropriate.  MEDICAL DECISION MAKING: Patient here with generalized weakness. Nonfocal neurologic exam. Hemodynamically stable. Discussed with family the differential diagnosis includes infection, anemia, dehydration, electrolyte abnormality, ACS. Less likely stroke. We'll obtain labs, urine, chest x-ray, CT of the head. We'll give IV fluids. If workup is unremarkable family is comfortable with discharge home.  ED PROGRESS: Patient's labs unremarkable other than mild leukocytosis with left shift and is slowly rising creatinine over the past several years. Last creatinine was in 2017 and was 1.8. Today is 2.1. She has received a liter of IV fluids. Have discussed this with family and recommend close follow-up with her primary care provider and increased fluid intake at home.   6:20 AM  Pt has large leukocytes, too numerous to count white blood cells and few bacteria. Urine culture has been sent. Will treat with ceftriaxone and discharged on Keflex. Family is comfortable with taking patient home.  Have discussed with him the slowly rising creatinine which they were aware of. Discussed that she needs to have this rechecked in a week and they will push the fluids at home. Discussed return precautions.  At this time I do not feel she needs to be admitted to the hospital and family is comfortable with this plan and they agree.  At this time, I do not feel there is any life-threatening condition present. I have reviewed and discussed all results (EKG, imaging, lab, urine as appropriate) and exam findings with patient/family. I have reviewed nursing notes and appropriate previous records.  I feel the patient is safe to be discharged home without further emergent workup and can continue workup as an outpatient as needed. Discussed usual and customary return precautions. Patient/family verbalize understanding and are comfortable with this plan.  Outpatient follow-up has been provided if needed. All questions have been answered.      EKG Interpretation  Date/Time:  Friday December 21 2016 03:20:10 EDT Ventricular Rate:  69 PR Interval:    QRS Duration: 150 QT Interval:  427 QTC Calculation: 458 R Axis:   -  73 Text Interpretation:  VENTRICULAR PACED RHYTHM No significant change since last tracing Confirmed by Xandra Laramee, Cyril Mourning [phone]) on 12/21/2016 5:18:38 AM        I personally performed the services described in this documentation, which was scribed in my presence. The recorded information has been reviewed and is accurate.     Jathen Sudano, Delice Bison, DO 12/21/16 [phone]

## 2016-12-21 NOTE — ED Notes (Signed)
And family state they understand instructions.Home stable via wc.

## 2016-12-22 LAB — URINE CULTURE

## 2017-01-31 ENCOUNTER — Telehealth: Payer: Self-pay | Admitting: Cardiology

## 2017-01-31 ENCOUNTER — Ambulatory Visit (INDEPENDENT_AMBULATORY_CARE_PROVIDER_SITE_OTHER): Payer: Medicare Other | Admitting: *Deleted

## 2017-01-31 DIAGNOSIS — I442 Atrioventricular block, complete: Secondary | ICD-10-CM | POA: Diagnosis not present

## 2017-01-31 NOTE — Telephone Encounter (Signed)
LMOVM reminding pt to send remote transmission.   

## 2017-01-31 NOTE — Progress Notes (Signed)
Remote pacemaker transmission.   

## 2017-02-05 ENCOUNTER — Encounter: Payer: Self-pay | Admitting: Cardiology

## 2017-02-18 ENCOUNTER — Encounter: Payer: Medicare Other | Admitting: Internal Medicine

## 2017-02-19 ENCOUNTER — Encounter: Payer: Self-pay | Admitting: Internal Medicine

## 2017-02-19 ENCOUNTER — Telehealth: Payer: Self-pay | Admitting: Internal Medicine

## 2017-02-19 NOTE — Telephone Encounter (Signed)
error 

## 2017-02-23 LAB — CUP PACEART REMOTE DEVICE CHECK
Battery Impedance: 852 Ohm
Battery Remaining Longevity: 46 mo
Brady Statistic AP VP Percent: 79 %
Brady Statistic AS VP Percent: 21 %
Date Time Interrogation Session: 20180712175213
Implantable Lead Implant Date: 20031226
Implantable Lead Location: 753860
Implantable Lead Model: 5076
Implantable Lead Model: 5076
Implantable Pulse Generator Implant Date: 20120520
Lead Channel Impedance Value: 342 Ohm
Lead Channel Pacing Threshold Amplitude: 0.75 V
Lead Channel Setting Pacing Amplitude: 2 V
Lead Channel Setting Pacing Amplitude: 2.5 V
Lead Channel Setting Pacing Pulse Width: 1 ms
MDC IDC LEAD IMPLANT DT: 20031226
MDC IDC LEAD LOCATION: 753859
MDC IDC MSMT BATTERY VOLTAGE: 2.77 V
MDC IDC MSMT LEADCHNL RA PACING THRESHOLD PULSEWIDTH: 0.4 ms
MDC IDC MSMT LEADCHNL RV IMPEDANCE VALUE: 489 Ohm
MDC IDC MSMT LEADCHNL RV PACING THRESHOLD AMPLITUDE: 1.125 V
MDC IDC MSMT LEADCHNL RV PACING THRESHOLD PULSEWIDTH: 0.4 ms
MDC IDC SET LEADCHNL RV SENSING SENSITIVITY: 2.8 mV
MDC IDC STAT BRADY AP VS PERCENT: 0 %
MDC IDC STAT BRADY AS VS PERCENT: 0 %

## 2017-03-21 NOTE — Progress Notes (Deleted)
Electrophysiology Office Note Date: 03/21/2017  ID:  Sonya, [name] [birth year], MRN [id number]  PCP: Sonya Resides, MD Electrophysiologist: [name]  CC: Pacemaker follow-up  [name] is a 76 y.o. female seen today for Dr [name].  She presents today for routine electrophysiology followup.  Since last being seen in our clinic, the patient reports doing very well.  She denies chest pain, palpitations, dyspnea, PND, orthopnea, nausea, vomiting, dizziness, syncope, edema, weight gain, or early satiety.  Device History: MDT dual chamber PPM implanted 2003 for complete heart block; gen change 2012   Past Medical History:  Diagnosis Date  . Atrioventricular block, complete (HCC)    s/p PPM  . Blood transfusion abn reaction or complication, no procedure mishap   . Blood transfusion without reported diagnosis   . Cataract   . Diastolic dysfunction 2/99/3716  . Diverticulosis 11/2005   colon, history  . Gastrointestinal hemorrhage 08/2005   Not felt to be a coumadin or ASA candidate  . Heart murmur   . History of Guillain-Barre syndrome    9/11  . Hypertension   . Hypertr obst cardiomyop   . Peptic ulcer disease 08/2005  . Permanent atrial fibrillation (HCC)    prior GI bleeding- not felt to be a candidate for ASA or coumadin, chadsvasc score is at least 4  . Personal history of colonic polyps - adenomas 11/02/2013  . UGI bleed 04/2009   Past Surgical History:  Procedure Laterality Date  . [place]  . CARDIAC PACEMAKER PLACEMENT  2003/2004   , most recent generator change 5/12, MDT  . COLONOSCOPY    . FETAL BLOOD TRANSFUSION    . UPPER GASTROINTESTINAL ENDOSCOPY    . VAGOTOMY  1995    Current Outpatient Prescriptions  Medication Sig Dispense Refill  . acetaminophen (TYLENOL) 500 MG tablet Take 500 mg by mouth every 6 (six) hours as needed (pain).     . calcium-vitamin D 250-100 MG-UNIT per tablet Take 1 tablet by mouth 2 (two) times daily.     . cephALEXin (KEFLEX) 500 MG capsule Take 1 capsule (500 mg total) by mouth 2 (two) times daily. 14 capsule 0  . donepezil (ARICEPT) 10 MG tablet TAKE ONE TABLET BY MOUTH AT BEDTIME 90 tablet 3  . ferrous sulfate 324 (65 FE) MG TBEC Take 1 tablet by mouth daily.    . hydrochlorothiazide (MICROZIDE) 12.5 MG capsule TAKE ONE CAPSULE BY MOUTH ONCE DAILY 90 capsule 3  . metoprolol succinate (TOPROL-XL) 50 MG 24 hr tablet TAKE ONE TABLET BY MOUTH ONCE DAILY. TAKE WITH OR IMMEDIATELY FOLLOWING A MEAL. 90 tablet 3   No current facility-administered medications for this visit.     Allergies:   Aspirin and Orphenadrine citrate   Social History: Social History   Social History  . Marital status: Divorced    Spouse name: N/A  . Number of children: N/A  . Years of education: N/A   Occupational History  . Not on file.   Social History Main Topics  . Smoking status: Never Smoker  . Smokeless tobacco: Never Used  . Alcohol use No  . Drug use: No  . Sexual activity: No   Other Topics Concern  . Not on file   Social History Narrative   Lives with Daughter [name]. Has two other children, a son and daughter.    Family History: Family History  Problem Relation Age of Onset  . Transient ischemic attack Father   . COPD  Father   . Hypertension Daughter   . Breast cancer Sister   . Colon cancer Neg Hx   . Pancreatic cancer Neg Hx   . Stomach cancer Neg Hx      Review of Systems: All other systems reviewed and are otherwise negative except as noted above.   Physical Exam: VS:  There were no vitals taken for this visit. , BMI There is no height or weight on file to calculate BMI.  GEN- The patient is elderly and thin appearing, alert and oriented x 3 today.   HEENT: normocephalic, atraumatic; sclera clear, conjunctiva pink; hearing intact; oropharynx clear; neck supple Lungs- Clear to ausculation bilaterally, normal work of breathing.  No wheezes, rales, rhonchi Heart- Regular  rate and rhythm (paced) GI- soft, non-tender, non-distended, bowel sounds present Extremities- no clubbing, cyanosis, or edema; DP/PT/radial pulses 2+ bilaterally MS- no significant deformity or atrophy Skin- warm and dry, no rash or lesion; PPM pocket well healed Psych- euthymic mood, full affect Neuro- strength and sensation are intact  PPM Interrogation- reviewed in detail today,  See PACEART report  EKG:  EKG is ordered today. The ekg ordered today shows ***  Recent Labs: 12/21/2016: ALT 21; BUN 40; Creatinine, Ser 2.11; Hemoglobin 14.4; Platelets 255; Potassium 4.0; Sodium 140   Wt Readings from Last 3 Encounters:  12/21/16 118 lb (53.5 kg)  05/31/16 118 lb 12.8 oz (53.9 kg)  03/21/16 120 lb 3.2 oz (54.5 kg)     Other studies Reviewed: Additional studies/ records that were reviewed today include: Dr Jackalyn Lombard office notes  Assessment and Plan:  1.  Complete heart block Normal PPM function - pt device dependent today  See Pace Art report No changes today  2.  Persistent atrial fibrillation CHADS2VASC is 4 Not on [place] 2/2 GI bleeding She would prefer to continue as she is without pursuing LAAO therapy  3.  HCM Stable No change required today  4.  HTN Stable No change required today   Current medicines are reviewed at length with the patient today.   The patient does not have concerns regarding her medicines.  The following changes were made today:  none  Labs/ tests ordered today include:  No orders of the defined types were placed in this encounter.    Disposition:   Follow up with [facility], Dr [name] 1 year    Signed, Chanetta Marshall, NP 03/21/2017 9:49 AM  [facility] [address] [place] [address] [phone] (office) [phone] (fax)

## 2017-03-22 ENCOUNTER — Ambulatory Visit: Payer: Medicare Other | Admitting: Nurse Practitioner

## 2017-03-26 ENCOUNTER — Encounter: Payer: Self-pay | Admitting: Nurse Practitioner

## 2017-04-18 ENCOUNTER — Encounter: Payer: Self-pay | Admitting: Gastroenterology

## 2017-04-22 ENCOUNTER — Ambulatory Visit (INDEPENDENT_AMBULATORY_CARE_PROVIDER_SITE_OTHER): Payer: Medicare Other | Admitting: Internal Medicine

## 2017-04-22 ENCOUNTER — Encounter: Payer: Self-pay | Admitting: Internal Medicine

## 2017-04-22 ENCOUNTER — Encounter (INDEPENDENT_AMBULATORY_CARE_PROVIDER_SITE_OTHER): Payer: Self-pay

## 2017-04-22 DIAGNOSIS — I70209 Unspecified atherosclerosis of native arteries of extremities, unspecified extremity: Secondary | ICD-10-CM | POA: Diagnosis not present

## 2017-04-22 DIAGNOSIS — F0150 Vascular dementia without behavioral disturbance: Secondary | ICD-10-CM

## 2017-04-22 DIAGNOSIS — Z86010 Personal history of colonic polyps: Secondary | ICD-10-CM | POA: Diagnosis not present

## 2017-04-22 DIAGNOSIS — I7090 Unspecified atherosclerosis: Secondary | ICD-10-CM

## 2017-04-22 NOTE — Assessment & Plan Note (Signed)
Co-morbity affecting colonoscopy decision

## 2017-04-22 NOTE — Patient Instructions (Addendum)
  Follow up with Dr Gessner as needed.   I appreciate the opportunity to care for you. Carl Gessner, MD, FACG 

## 2017-04-22 NOTE — Assessment & Plan Note (Signed)
No more colonoscopy - age + co-morbidities

## 2017-04-22 NOTE — Progress Notes (Signed)
[name] 76 y.o. 02/27/1941 [id number]  Assessment & Plan:   Encounter Diagnoses  Name Primary?  . Personal history of colonic polyps - adenomas   . 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 Patient and daughter made decision w/ me. See me PRN  I appreciate the opportunity to care for this patient. PT:WSFKCL, Jamal Collin, MD    Subjective:   Chief Complaint: hx colon polyps  HPI Very nice elderly woman with hx adenomas 3 yrs aao at colonoscopy - has multi-infarct dementia and appetite off and losing weight. Daughter here and provides hx. No GI Sxs Wt Readings from Last 3 Encounters:  04/22/17 112 lb (50.8 kg)  12/21/16 118 lb (53.5 kg)  05/31/16 118 lb 12.8 oz (53.9 kg)   Not anemic  No GI Sxs Allergies  Allergen Reactions  . Aspirin     REACTION: doesn't take due to trouble w/ ulcers  . Orphenadrine Citrate     UNKNOWN   Current Meds  Medication Sig  . acetaminophen (TYLENOL) 500 MG tablet Take 500 mg by mouth every 6 (six) hours as needed (pain).   . calcium-vitamin D 250-100 MG-UNIT per tablet Take 1 tablet by mouth 2 (two) times daily.  . cephALEXin (KEFLEX) 500 MG capsule Take 1 capsule (500 mg total) by mouth 2 (two) times daily.  Marland Kitchen donepezil (ARICEPT) 10 MG tablet TAKE ONE TABLET BY MOUTH AT BEDTIME  . ferrous sulfate 324 (65 FE) MG TBEC Take 1 tablet by mouth daily.  . hydrochlorothiazide (MICROZIDE) 12.5 MG capsule TAKE ONE CAPSULE BY MOUTH ONCE DAILY  . metoprolol succinate (TOPROL-XL) 50 MG 24 hr tablet TAKE ONE TABLET BY MOUTH ONCE DAILY. TAKE WITH OR IMMEDIATELY FOLLOWING A MEAL.   Past Medical History:  Diagnosis Date  . Atrioventricular block, complete (HCC)    s/p PPM  . Blood transfusion abn reaction or complication, no procedure mishap   . Blood transfusion without reported diagnosis   . Cardiomyopathy ([place])   . Cataract   . CHF  (congestive heart failure) ([place])   . CKD (chronic kidney disease)   . Dementia   . Diastolic dysfunction 2/75/1700  . Diverticulosis 11/2005   colon, history  . Gastrointestinal hemorrhage 08/2005   Not felt to be a coumadin or ASA candidate  . GERD (gastroesophageal reflux disease)   . Gout   . Heart murmur   . History of Guillain-Barre syndrome    9/11  . Hypertension   . Hypertr obst cardiomyop   . IDA (iron deficiency anemia)   . Mild aortic sclerosis ([place])   . OA (osteoarthritis)   . Osteoporosis   . Peptic ulcer disease 08/2005  . Permanent atrial fibrillation (HCC)    prior GI bleeding- not felt to be a candidate for ASA or coumadin, chadsvasc score is at least 4  . Personal history of colonic polyps - adenomas 11/02/2013  . UGI bleed 04/2009   Past Surgical History:  Procedure Laterality Date  . [place]  . CARDIAC PACEMAKER PLACEMENT  2003/2004   , most recent generator change 5/12, MDT  . COLONOSCOPY    . FETAL BLOOD TRANSFUSION    . UPPER GASTROINTESTINAL ENDOSCOPY    . VAGOTOMY  1995     Review of Systems As per HPI  Objective:   Physical Exam BP 100/64   Pulse 60   Ht 5\' 2"  (1.575  m)   Wt 112 lb (50.8 kg)   BMI 20.49 kg/m   Elderly  bw NAD - alert but not oriented  15 minutes time spent with patient > half in counseling coordination of care

## 2017-05-02 ENCOUNTER — Ambulatory Visit (INDEPENDENT_AMBULATORY_CARE_PROVIDER_SITE_OTHER): Payer: Medicare Other | Admitting: *Deleted

## 2017-05-02 ENCOUNTER — Telehealth: Payer: Self-pay | Admitting: Cardiology

## 2017-05-02 DIAGNOSIS — I442 Atrioventricular block, complete: Secondary | ICD-10-CM

## 2017-05-02 NOTE — Telephone Encounter (Signed)
Confirmed remote transmission w/ pt daughter.   

## 2017-05-02 NOTE — Progress Notes (Signed)
Remote pacemaker transmission.   

## 2017-05-03 ENCOUNTER — Encounter: Payer: Self-pay | Admitting: Cardiology

## 2017-05-03 NOTE — Progress Notes (Signed)
Letter  

## 2017-05-13 LAB — CUP PACEART REMOTE DEVICE CHECK
Battery Remaining Longevity: 44 mo
Battery Voltage: 2.77 V
Brady Statistic AP VS Percent: 0 %
Date Time Interrogation Session: 20181011151151
Implantable Lead Implant Date: 20031226
Implantable Lead Implant Date: 20031226
Implantable Lead Location: 753859
Lead Channel Pacing Threshold Amplitude: 1.125 V
Lead Channel Pacing Threshold Pulse Width: 0.4 ms
Lead Channel Pacing Threshold Pulse Width: 0.4 ms
Lead Channel Setting Pacing Amplitude: 2 V
Lead Channel Setting Pacing Pulse Width: 1 ms
Lead Channel Setting Sensing Sensitivity: 2.8 mV
MDC IDC LEAD LOCATION: 753860
MDC IDC MSMT BATTERY IMPEDANCE: 928 Ohm
MDC IDC MSMT LEADCHNL RA IMPEDANCE VALUE: 343 Ohm
MDC IDC MSMT LEADCHNL RA PACING THRESHOLD AMPLITUDE: 0.75 V
MDC IDC MSMT LEADCHNL RV IMPEDANCE VALUE: 489 Ohm
MDC IDC PG IMPLANT DT: 20120520
MDC IDC SET LEADCHNL RV PACING AMPLITUDE: 2.5 V
MDC IDC STAT BRADY AP VP PERCENT: 78 %
MDC IDC STAT BRADY AS VP PERCENT: 22 %
MDC IDC STAT BRADY AS VS PERCENT: 0 %

## 2017-05-21 ENCOUNTER — Other Ambulatory Visit: Payer: Self-pay | Admitting: Family Medicine

## 2017-05-21 DIAGNOSIS — I10 Essential (primary) hypertension: Secondary | ICD-10-CM

## 2017-08-01 ENCOUNTER — Ambulatory Visit (INDEPENDENT_AMBULATORY_CARE_PROVIDER_SITE_OTHER): Payer: Medicare Other | Admitting: *Deleted

## 2017-08-01 ENCOUNTER — Telehealth: Payer: Self-pay | Admitting: Cardiology

## 2017-08-01 DIAGNOSIS — I442 Atrioventricular block, complete: Secondary | ICD-10-CM

## 2017-08-01 NOTE — Telephone Encounter (Signed)
LMOVM reminding pt to send remote transmission.   

## 2017-08-06 NOTE — Progress Notes (Signed)
Remote pacemaker transmission.   

## 2017-08-09 ENCOUNTER — Encounter: Payer: Self-pay | Admitting: Cardiology

## 2017-08-21 ENCOUNTER — Ambulatory Visit (INDEPENDENT_AMBULATORY_CARE_PROVIDER_SITE_OTHER): Payer: Medicare Other | Admitting: Family Medicine

## 2017-08-21 ENCOUNTER — Encounter: Payer: Self-pay | Admitting: Family Medicine

## 2017-08-21 ENCOUNTER — Other Ambulatory Visit: Payer: Self-pay

## 2017-08-21 DIAGNOSIS — I70209 Unspecified atherosclerosis of native arteries of extremities, unspecified extremity: Secondary | ICD-10-CM

## 2017-08-21 DIAGNOSIS — I5032 Chronic diastolic (congestive) heart failure: Secondary | ICD-10-CM

## 2017-08-21 DIAGNOSIS — I4821 Permanent atrial fibrillation: Secondary | ICD-10-CM

## 2017-08-21 DIAGNOSIS — I7090 Unspecified atherosclerosis: Principal | ICD-10-CM

## 2017-08-21 DIAGNOSIS — N184 Chronic kidney disease, stage 4 (severe): Secondary | ICD-10-CM

## 2017-08-21 DIAGNOSIS — I482 Chronic atrial fibrillation: Secondary | ICD-10-CM | POA: Diagnosis not present

## 2017-08-21 DIAGNOSIS — F0150 Vascular dementia without behavioral disturbance: Secondary | ICD-10-CM | POA: Diagnosis not present

## 2017-08-21 DIAGNOSIS — I10 Essential (primary) hypertension: Secondary | ICD-10-CM | POA: Diagnosis not present

## 2017-08-21 DIAGNOSIS — N183 Chronic kidney disease, stage 3 unspecified: Secondary | ICD-10-CM

## 2017-08-21 DIAGNOSIS — R634 Abnormal weight loss: Secondary | ICD-10-CM

## 2017-08-21 NOTE — Patient Instructions (Addendum)
I will call with blood test results. You need to eat more.  OK to use ensure for snacks. Stop the hydrochlorothiazide - a fluid and blood pressure pill.  I am worried that we may be over treating your high blood pressure. Double check your med list with mine See me in three months to make sure the weight is OK.

## 2017-08-22 ENCOUNTER — Encounter: Payer: Self-pay | Admitting: Family Medicine

## 2017-08-22 DIAGNOSIS — R634 Abnormal weight loss: Secondary | ICD-10-CM | POA: Insufficient documentation

## 2017-08-22 LAB — CMP14+EGFR
ALK PHOS: 55 IU/L (ref 39–117)
ALT: 18 IU/L (ref 0–32)
AST: 21 IU/L (ref 0–40)
Albumin/Globulin Ratio: 1.5 (ref 1.2–2.2)
Albumin: 4.1 g/dL (ref 3.5–4.8)
BUN/Creatinine Ratio: 13 (ref 12–28)
BUN: 29 mg/dL — AB (ref 8–27)
Bilirubin Total: 0.4 mg/dL (ref 0.0–1.2)
CO2: 23 mmol/L (ref 20–29)
CREATININE: 2.21 mg/dL — AB (ref 0.57–1.00)
Calcium: 9.5 mg/dL (ref 8.7–10.3)
Chloride: 103 mmol/L (ref 96–106)
GFR calc Af Amer: 24 mL/min/{1.73_m2} — ABNORMAL LOW (ref >59)
GFR calc non Af Amer: 21 mL/min/{1.73_m2} — ABNORMAL LOW (ref >59)
GLUCOSE: 97 mg/dL (ref 65–99)
Globulin, Total: 2.8 g/dL (ref 1.5–4.5)
Potassium: 4 mmol/L (ref 3.5–5.2)
SODIUM: 142 mmol/L (ref 134–144)
Total Protein: 6.9 g/dL (ref 6.0–8.5)

## 2017-08-22 LAB — LIPID PANEL
CHOL/HDL RATIO: 2.9 ratio (ref 0.0–4.4)
Cholesterol, Total: 144 mg/dL (ref 100–199)
HDL: 50 mg/dL (ref >39)
LDL Calculated: 78 mg/dL (ref 0–99)
Triglycerides: 82 mg/dL (ref 0–149)
VLDL Cholesterol Cal: 16 mg/dL (ref 5–40)

## 2017-08-22 LAB — CBC
HEMATOCRIT: 41.3 % (ref 34.0–46.6)
Hemoglobin: 13.3 g/dL (ref 11.1–15.9)
MCH: 29.9 pg (ref 26.6–33.0)
MCHC: 32.2 g/dL (ref 31.5–35.7)
MCV: 93 fL (ref 79–97)
Platelets: 316 10*3/uL (ref 150–379)
RBC: 4.45 x10E6/uL (ref 3.77–5.28)
RDW: 12.8 % (ref 12.3–15.4)
WBC: 7.9 10*3/uL (ref 3.4–10.8)

## 2017-08-22 LAB — TSH: TSH: 2.58 u[IU]/mL (ref 0.450–4.500)

## 2017-08-22 LAB — FERRITIN: FERRITIN: 484 ng/mL — AB (ref 15–150)

## 2017-08-22 NOTE — Assessment & Plan Note (Signed)
Likely due to dementia.  Encourage PO intake.  Add supplements.  Serum proteins OK.  Doubt uremia.

## 2017-08-22 NOTE — Progress Notes (Signed)
   Subjective:    Patient ID: [name], female    DOB: 1941-02-28, 77 y.o.   MRN: [id number]  HPI  Several issues. Wt loss: involuntary.  Only eating 1-2 meals per day.  "I just don't have an appetitite. Memory loss.  Seem not much worse than last year. Known CKD - due for recheck. Known diastolic CHF.  Denies DOE Known HBP.  Taking meds.  No lightheaded spells or syncope.      Review of Systems     Objective:   Physical Exam VS and wt noted.   Lungs clear Cardiac RRR without m or g Ext no edema.       Assessment & Plan:

## 2017-08-22 NOTE — Assessment & Plan Note (Signed)
Not rapidly progressive.  Doing well on donepezil.

## 2017-08-22 NOTE — Assessment & Plan Note (Addendum)
Either in sinus, paced or nicely rate controled.  Worried about over treatment of HBP.  Will drop hctz - Not Beta blocker which treats a fib.

## 2017-08-22 NOTE — Assessment & Plan Note (Signed)
Stable on current meds.  Wt loss has helped this problem.

## 2017-08-22 NOTE — Assessment & Plan Note (Signed)
Perhaps over treated.  Drop HCTZ

## 2017-08-22 NOTE — Assessment & Plan Note (Signed)
Slowly progressive.  BP well controled.

## 2017-09-02 LAB — CUP PACEART REMOTE DEVICE CHECK
Battery Impedance: 1087 Ohm
Brady Statistic AP VP Percent: 77 %
Brady Statistic AP VS Percent: 0 %
Brady Statistic AS VS Percent: 0 %
Date Time Interrogation Session: 20190110210300
Implantable Lead Implant Date: 20031226
Implantable Lead Implant Date: 20031226
Implantable Lead Location: 753859
Implantable Lead Model: 5076
Implantable Lead Model: 5076
Lead Channel Impedance Value: 347 Ohm
Lead Channel Impedance Value: 471 Ohm
Lead Channel Pacing Threshold Pulse Width: 0.4 ms
Lead Channel Setting Pacing Amplitude: 2 V
Lead Channel Setting Pacing Amplitude: 2.5 V
MDC IDC LEAD LOCATION: 753860
MDC IDC MSMT BATTERY REMAINING LONGEVITY: 39 mo
MDC IDC MSMT BATTERY VOLTAGE: 2.76 V
MDC IDC MSMT LEADCHNL RA PACING THRESHOLD AMPLITUDE: 0.75 V
MDC IDC MSMT LEADCHNL RV PACING THRESHOLD AMPLITUDE: 1.125 V
MDC IDC MSMT LEADCHNL RV PACING THRESHOLD PULSEWIDTH: 0.4 ms
MDC IDC PG IMPLANT DT: 20120520
MDC IDC SET LEADCHNL RV PACING PULSEWIDTH: 1 ms
MDC IDC SET LEADCHNL RV SENSING SENSITIVITY: 2.8 mV
MDC IDC STAT BRADY AS VP PERCENT: 23 %

## 2017-10-30 ENCOUNTER — Encounter: Payer: Self-pay | Admitting: Family Medicine

## 2017-10-30 ENCOUNTER — Ambulatory Visit (INDEPENDENT_AMBULATORY_CARE_PROVIDER_SITE_OTHER): Payer: Medicare Other | Admitting: Family Medicine

## 2017-10-30 ENCOUNTER — Other Ambulatory Visit: Payer: Self-pay

## 2017-10-30 DIAGNOSIS — I7090 Unspecified atherosclerosis: Secondary | ICD-10-CM

## 2017-10-30 DIAGNOSIS — I10 Essential (primary) hypertension: Secondary | ICD-10-CM

## 2017-10-30 DIAGNOSIS — N184 Chronic kidney disease, stage 4 (severe): Secondary | ICD-10-CM | POA: Diagnosis not present

## 2017-10-30 DIAGNOSIS — F0150 Vascular dementia without behavioral disturbance: Secondary | ICD-10-CM

## 2017-10-30 DIAGNOSIS — I482 Chronic atrial fibrillation: Secondary | ICD-10-CM | POA: Diagnosis not present

## 2017-10-30 DIAGNOSIS — I70209 Unspecified atherosclerosis of native arteries of extremities, unspecified extremity: Secondary | ICD-10-CM | POA: Diagnosis not present

## 2017-10-30 DIAGNOSIS — I4821 Permanent atrial fibrillation: Secondary | ICD-10-CM

## 2017-10-30 NOTE — Patient Instructions (Signed)
She is doing well.  I am glad her weight is up a couple of pounds.  Please make the appointment with the cardiologist. Also, please talk about wishes at the end of life.   See me in three months for a brief check like today.

## 2017-10-31 ENCOUNTER — Telehealth: Payer: Self-pay | Admitting: Cardiology

## 2017-10-31 ENCOUNTER — Encounter: Payer: Self-pay | Admitting: Family Medicine

## 2017-10-31 ENCOUNTER — Ambulatory Visit (INDEPENDENT_AMBULATORY_CARE_PROVIDER_SITE_OTHER): Payer: Medicare Other | Admitting: *Deleted

## 2017-10-31 DIAGNOSIS — I442 Atrioventricular block, complete: Secondary | ICD-10-CM

## 2017-10-31 NOTE — Progress Notes (Signed)
Remote pacemaker transmission.   

## 2017-10-31 NOTE — Assessment & Plan Note (Signed)
Slowly progressive.  Still functional and happy.  Daughter is pleased with current status.  Has days and nights mixed up.  I said OK to try melatonin.  Avoid sedatives due to risk of worsening dementia.

## 2017-10-31 NOTE — Assessment & Plan Note (Signed)
Stable on current meds.  Euvolemic.

## 2017-10-31 NOTE — Progress Notes (Signed)
   Subjective:    Patient ID: [name], female    DOB: 1941-02-09, 77 y.o.   MRN: [id number]  HPI Patient here for wt recheck.  Doing better.  Using ensure supplemental.  Feels fine.  Has gained 2 lbs.   Dementia is slowly progressive.   Jan blood work reviewed and reassuring. Asked if needs to go to cards.  Has perm A fib and pacer.  Yes, Cardiology appointment is necessary. Daughter has not had any end of life conversations.  No advanced directives.  Discussed the importance of this when the time comes.      Review of Systems     Objective:   Physical Exam Wt up 2lbs.   VS otherwise great. Lungs clear Cardiac RRR without m or g Abd benign Ext trace edema Neuro - still converses superficially.  Defers to daughter for anything that calls for memory.        Assessment & Plan:

## 2017-10-31 NOTE — Assessment & Plan Note (Signed)
Well controled on current meds 

## 2017-10-31 NOTE — Assessment & Plan Note (Signed)
Stable.  Should see cards for recheck.

## 2017-10-31 NOTE — Telephone Encounter (Signed)
Confirmed remote transmission w/ pt daughter.   

## 2017-11-06 ENCOUNTER — Encounter: Payer: Self-pay | Admitting: Cardiology

## 2017-11-21 DIAGNOSIS — Z01818 Encounter for other preprocedural examination: Secondary | ICD-10-CM | POA: Diagnosis not present

## 2017-11-21 DIAGNOSIS — H268 Other specified cataract: Secondary | ICD-10-CM | POA: Diagnosis not present

## 2017-11-21 DIAGNOSIS — H401122 Primary open-angle glaucoma, left eye, moderate stage: Secondary | ICD-10-CM | POA: Diagnosis not present

## 2017-12-09 ENCOUNTER — Other Ambulatory Visit: Payer: Self-pay | Admitting: Family Medicine

## 2017-12-09 LAB — CUP PACEART REMOTE DEVICE CHECK
Battery Impedance: 1193 Ohm
Battery Remaining Longevity: 36 mo
Battery Voltage: 2.76 V
Brady Statistic AP VS Percent: 0 %
Implantable Lead Implant Date: 20031226
Implantable Lead Location: 753859
Implantable Lead Location: 753860
Implantable Lead Model: 5076
Implantable Pulse Generator Implant Date: 20120520
Lead Channel Pacing Threshold Amplitude: 0.875 V
Lead Channel Pacing Threshold Amplitude: 1.125 V
Lead Channel Pacing Threshold Pulse Width: 0.4 ms
Lead Channel Setting Pacing Amplitude: 2 V
Lead Channel Setting Pacing Amplitude: 2.5 V
Lead Channel Setting Pacing Pulse Width: 1 ms
Lead Channel Setting Sensing Sensitivity: 2.8 mV
MDC IDC LEAD IMPLANT DT: 20031226
MDC IDC MSMT LEADCHNL RA IMPEDANCE VALUE: 334 Ohm
MDC IDC MSMT LEADCHNL RA PACING THRESHOLD PULSEWIDTH: 0.4 ms
MDC IDC MSMT LEADCHNL RV IMPEDANCE VALUE: 447 Ohm
MDC IDC SESS DTM: 20190411194750
MDC IDC STAT BRADY AP VP PERCENT: 76 %
MDC IDC STAT BRADY AS VP PERCENT: 23 %
MDC IDC STAT BRADY AS VS PERCENT: 0 %

## 2017-12-11 ENCOUNTER — Telehealth: Payer: Self-pay

## 2017-12-11 MED ORDER — TRAZODONE HCL 50 MG PO TABS: 25.0000 mg | ORAL_TABLET | Freq: Every evening | ORAL | 3 refills | Status: DC | PRN

## 2017-12-11 NOTE — Telephone Encounter (Signed)
Patient daughter, [name], left message on nurse line to tell PCP that Melatonin is not helping patient to sleep. She requests a medication be prescribed that will help her mother sleep if possible.   Left callback number of [phone].  Danley Danker, RN [facility] RN)

## 2017-12-11 NOTE — Telephone Encounter (Signed)
Will try trazodone

## 2017-12-21 ENCOUNTER — Encounter (HOSPITAL_COMMUNITY): Payer: Self-pay

## 2017-12-21 ENCOUNTER — Emergency Department (HOSPITAL_COMMUNITY)
Admission: EM | Admit: 2017-12-21 | Discharge: 2017-12-21 | Disposition: A | Discharge status: Home / Self Care | Payer: Medicare Other | Attending: Emergency Medicine | Admitting: Emergency Medicine

## 2017-12-21 ENCOUNTER — Other Ambulatory Visit: Payer: Self-pay

## 2017-12-21 ENCOUNTER — Emergency Department (HOSPITAL_COMMUNITY): Payer: Medicare Other

## 2017-12-21 DIAGNOSIS — T148XXA Other injury of unspecified body region, initial encounter: Secondary | ICD-10-CM | POA: Diagnosis not present

## 2017-12-21 DIAGNOSIS — F0390 Unspecified dementia without behavioral disturbance: Secondary | ICD-10-CM | POA: Insufficient documentation

## 2017-12-21 DIAGNOSIS — I4891 Unspecified atrial fibrillation: Secondary | ICD-10-CM | POA: Diagnosis not present

## 2017-12-21 DIAGNOSIS — N184 Chronic kidney disease, stage 4 (severe): Secondary | ICD-10-CM | POA: Diagnosis not present

## 2017-12-21 DIAGNOSIS — M5489 Other dorsalgia: Secondary | ICD-10-CM | POA: Diagnosis not present

## 2017-12-21 DIAGNOSIS — M2550 Pain in unspecified joint: Secondary | ICD-10-CM | POA: Diagnosis not present

## 2017-12-21 DIAGNOSIS — M79604 Pain in right leg: Secondary | ICD-10-CM | POA: Diagnosis not present

## 2017-12-21 DIAGNOSIS — I509 Heart failure, unspecified: Secondary | ICD-10-CM | POA: Diagnosis not present

## 2017-12-21 DIAGNOSIS — I129 Hypertensive chronic kidney disease with stage 1 through stage 4 chronic kidney disease, or unspecified chronic kidney disease: Secondary | ICD-10-CM | POA: Diagnosis not present

## 2017-12-21 DIAGNOSIS — Z8659 Personal history of other mental and behavioral disorders: Secondary | ICD-10-CM

## 2017-12-21 DIAGNOSIS — Z7401 Bed confinement status: Secondary | ICD-10-CM | POA: Diagnosis not present

## 2017-12-21 DIAGNOSIS — M25461 Effusion, right knee: Secondary | ICD-10-CM | POA: Diagnosis not present

## 2017-12-21 DIAGNOSIS — Z79899 Other long term (current) drug therapy: Secondary | ICD-10-CM | POA: Diagnosis not present

## 2017-12-21 DIAGNOSIS — M25551 Pain in right hip: Secondary | ICD-10-CM | POA: Diagnosis not present

## 2017-12-21 LAB — CBC
HCT: 39.5 % (ref 36.0–46.0)
Hemoglobin: 12.6 g/dL (ref 12.0–15.0)
MCH: 29.3 pg (ref 26.0–34.0)
MCHC: 31.9 g/dL (ref 30.0–36.0)
MCV: 91.9 fL (ref 78.0–100.0)
PLATELETS: 220 10*3/uL (ref 150–400)
RBC: 4.3 MIL/uL (ref 3.87–5.11)
RDW: 13.2 % (ref 11.5–15.5)
WBC: 11.7 10*3/uL — ABNORMAL HIGH (ref 4.0–10.5)

## 2017-12-21 LAB — BASIC METABOLIC PANEL
Anion gap: 10 (ref 5–15)
BUN: 29 mg/dL — AB (ref 6–20)
CO2: 24 mmol/L (ref 22–32)
Calcium: 9.5 mg/dL (ref 8.9–10.3)
Chloride: 107 mmol/L (ref 101–111)
Creatinine, Ser: 1.3 mg/dL — ABNORMAL HIGH (ref 0.44–1.00)
GFR calc Af Amer: 44 mL/min — ABNORMAL LOW (ref >60)
GFR, EST NON AFRICAN AMERICAN: 38 mL/min — AB (ref >60)
GLUCOSE: 107 mg/dL — AB (ref 65–99)
POTASSIUM: 4.5 mmol/L (ref 3.5–5.1)
Sodium: 141 mmol/L (ref 135–145)

## 2017-12-21 LAB — URINALYSIS, ROUTINE W REFLEX MICROSCOPIC
Bilirubin Urine: NEGATIVE
GLUCOSE, UA: NEGATIVE mg/dL
HGB URINE DIPSTICK: NEGATIVE
Ketones, ur: NEGATIVE mg/dL
Leukocytes, UA: NEGATIVE
Nitrite: NEGATIVE
PROTEIN: NEGATIVE mg/dL
Specific Gravity, Urine: 1.024 (ref 1.005–1.030)
pH: 5 (ref 5.0–8.0)

## 2017-12-21 MED ORDER — SODIUM CHLORIDE 0.9 % IV BOLUS
1000.0000 mL | Freq: Once | INTRAVENOUS | Status: AC
  Administered 2017-12-21: 1000 mL via INTRAVENOUS

## 2017-12-21 MED ORDER — ACETAMINOPHEN 500 MG PO TABS
1000.0000 mg | ORAL_TABLET | Freq: Once | ORAL | Status: AC
  Administered 2017-12-21: 1000 mg via ORAL
  Filled 2017-12-21: qty 2

## 2017-12-21 MED ORDER — TRAMADOL HCL 50 MG PO TABS
50.0000 mg | ORAL_TABLET | Freq: Once | ORAL | Status: AC
  Administered 2017-12-21: 50 mg via ORAL
  Filled 2017-12-21: qty 1

## 2017-12-21 NOTE — ED Notes (Signed)
Pt unable to sign for discharge 

## 2017-12-21 NOTE — ED Notes (Signed)
Pt is alert and oriented  To self./ Pt dtrs are at bedside and reports that pt was able to ambulate up until last Tues, and has been grimacing and yelling put in pain when they try to walk her at home. Family reports poor oral intake, and has not been sleeping well, per DTR.

## 2017-12-21 NOTE — Discharge Instructions (Addendum)
It was our pleasure to provide your ER care today - we hope that you feel better.  Your xrays were showed no acute fracture - arthritic/degenerative changes were noted.   The urine tests are normal, no infection.   Take acetaminophen as need for pain.   We have also made a home health referral to check on your progress, and offer additional assistance at home - you should be hearing from the home health agency in the next 1-2 days.   Also follow up with primary care doctor in the next few days.  Return to ER if worse, new symptoms, fevers, other concern.

## 2017-12-21 NOTE — ED Provider Notes (Signed)
[facility] DEPT Provider Note   CSN: [id number] Arrival date & time: 12/21/17  1452     History   Chief Complaint Chief Complaint  Patient presents with  . Joint Pain    HPI [name] is a 77 y.o. female.  Patient with hx dementia, with decreased ambulation in past 1-2 days, and appears to have pain with movement legs, especially right, in past couple days. No hx trauma or fall. Pt very limited historian - unable to describe or localize pain - level 5 caveat. No leg swelling or redness. No fever/chills. Normal appetite.   The history is provided by the patient and a relative. The history is limited by the condition of the patient.    Past Medical History:  Diagnosis Date  . Atrioventricular block, complete (HCC)    s/p PPM  . Blood transfusion abn reaction or complication, no procedure mishap   . Blood transfusion without reported diagnosis   . Cardiomyopathy ([place])   . Cataract   . CHF (congestive heart failure) ([place])   . CKD (chronic kidney disease)   . Dementia   . Diastolic dysfunction 7/62/8315  . Diverticulosis 11/2005   colon, history  . Gastrointestinal hemorrhage 08/2005   Not felt to be a coumadin or ASA candidate  . GERD (gastroesophageal reflux disease)   . Gout   . Heart murmur   . History of Guillain-Barre syndrome    9/11  . Hypertension   . Hypertr obst cardiomyop   . IDA (iron deficiency anemia)   . Mild aortic sclerosis ([place])   . OA (osteoarthritis)   . Osteoporosis   . Peptic ulcer disease 08/2005  . Permanent atrial fibrillation (HCC)    prior GI bleeding- not felt to be a candidate for ASA or coumadin, chadsvasc score is at least 4  . Personal history of colonic polyps - adenomas 11/02/2013  . UGI bleed 04/2009    Patient Active Problem List   Diagnosis Date Noted  . Weight loss, unintentional 08/22/2017  . CKD (chronic kidney disease) stage 4, GFR 15-29 ml/min (HCC) 05/31/2016  . Osteoporosis  10/07/2014  . 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 Personal history of colonic polyps - adenomas 11/02/2013  . Lower GI bleed 08/22/2013  . History of gout 12/07/2010  . Hx of Guillain-Barre syndrome 03/30/2010  . Essential hypertension 12/22/2008  . Diastolic CHF, chronic ([place]) 12/22/2008  . AV BLOCK, COMPLETE 12/22/2008  . Permanent atrial fibrillation ([place]) 12/22/2008  . PEPTIC ULCER DISEASE, HX OF 12/22/2008  . PACEMAKER, PERMANENT 12/22/2008  . Iron deficiency anemia 09/19/2006  . CARDIOMYOPATHY, IDIOPATHIC 09/19/2006  . GASTROESOPHAGEAL REFLUX, NO ESOPHAGITIS 09/19/2006  . OSTEOARTHRITIS, LOWER LEG 09/19/2006  . Aortic sclerosis ([place]) 09/19/2006    Past Surgical History:  Procedure Laterality Date  . [place]  . CARDIAC PACEMAKER PLACEMENT  2003/2004   , most recent generator change 5/12, MDT  . COLONOSCOPY    . FETAL BLOOD TRANSFUSION    . UPPER GASTROINTESTINAL ENDOSCOPY    . VAGOTOMY  1995     OB History   None      Home Medications    Prior to Admission medications   Medication Sig Start Date End Date Taking? Authorizing Provider  acetaminophen (TYLENOL) 500 MG tablet Take 500 mg by mouth every 6 (six) hours as needed (pain).     [provider]  calcium-vitamin D 250-100 MG-UNIT per tablet Take 1 tablet by mouth  2 (two) times daily. 10/07/14   Zenia Resides, MD  donepezil (ARICEPT) 10 MG tablet TAKE ONE TABLET BY MOUTH AT BEDTIME 12/09/17   Zenia Resides, MD  ferrous sulfate 324 (65 FE) MG TBEC Take 1 tablet by mouth daily.    [provider]  metoprolol succinate (TOPROL-XL) 50 MG 24 hr tablet TAKE ONE TABLET BY MOUTH ONCE DAILY WITH OR IMMEDIATELY FOLLOWING A MEAL 05/22/17   Hensel, Jamal Collin, MD  traZODone (DESYREL) 50 MG tablet Take 0.5-1 tablets (25-50 mg total) by mouth at bedtime as needed for sleep. 12/11/17   Zenia Resides, MD    Family History Family History  Problem  Relation Age of Onset  . Transient ischemic attack Father   . COPD Father   . Hypertension Daughter   . Breast cancer Sister   . Colon cancer Neg Hx   . Pancreatic cancer Neg Hx   . Stomach cancer Neg Hx     Social History Social History   Tobacco Use  . Smoking status: Never Smoker  . Smokeless tobacco: Never Used  Substance Use Topics  . Alcohol use: No  . Drug use: No     Allergies   Aspirin and Orphenadrine citrate   Review of Systems Review of Systems  Unable to perform ROS: Dementia  Constitutional: Negative for fever.  Respiratory: Negative for shortness of breath.   Gastrointestinal: Negative for vomiting.  Skin: Negative for rash and wound.  level 5 caveat, dementia - pt not answering ros questions.    Physical Exam Updated Vital Signs BP 134/80 (BP Location: Left Arm)   Pulse 64   Temp 98.1 F (36.7 C) (Oral)   Resp 18   Wt 47.6 kg (105 lb)   SpO2 98%   BMI 19.20 kg/m   Physical Exam  Constitutional: She appears well-developed and well-nourished.  HENT:  Head: Atraumatic.  Mouth/Throat: Oropharynx is clear and moist.  Eyes: Conjunctivae are normal. No scleral icterus.  Neck: Neck supple. No tracheal deviation present. No thyromegaly present.  Cardiovascular: Normal rate, regular rhythm, normal heart sounds and intact distal pulses. Exam reveals no gallop and no friction rub.  No murmur heard. Pulmonary/Chest: Effort normal and breath sounds normal. No respiratory distress.  Abdominal: Soft. Normal appearance and bowel sounds are normal. She exhibits no distension and no mass. There is no tenderness. There is no guarding.  Genitourinary:  Genitourinary Comments: No cva tenderness  Musculoskeletal: She exhibits no edema.  CTLS spine, non tender, aligned, no step off. Pain with rom at right knee and hip. Distal pulses palp bil extremities. No swelling to extremities. No skin changes, erythema or increased warmth.  Neurological: She is alert.    Awake and alert. Confused/hx dementia - mental status described as c/w baseline per family. Moves bil extremities purposefully.   Skin: Skin is warm and dry. No rash noted.  Psychiatric: She has a normal mood and affect.  Nursing note and vitals reviewed.    ED Treatments / Results  Labs (all labs ordered are listed, but only abnormal results are displayed) Results for orders placed or performed during the hospital encounter of 12/21/17  CBC  Result Value Ref Range   WBC 11.7 (H) 4.0 - 10.5 K/uL   RBC 4.30 3.87 - 5.11 MIL/uL   Hemoglobin 12.6 12.0 - 15.0 g/dL   HCT 39.5 36.0 - 46.0 %   MCV 91.9 78.0 - 100.0 fL   MCH 29.3 26.0 - 34.0 pg  MCHC 31.9 30.0 - 36.0 g/dL   RDW 13.2 11.5 - 15.5 %   Platelets 220 150 - 400 K/uL  Basic metabolic panel  Result Value Ref Range   Sodium 141 135 - 145 mmol/L   Potassium 4.5 3.5 - 5.1 mmol/L   Chloride 107 101 - 111 mmol/L   CO2 24 22 - 32 mmol/L   Glucose, Bld 107 (H) 65 - 99 mg/dL   BUN 29 (H) 6 - 20 mg/dL   Creatinine, Ser 1.30 (H) 0.44 - 1.00 mg/dL   Calcium 9.5 8.9 - 10.3 mg/dL   GFR calc non Af Amer 38 (L) >60 mL/min   GFR calc Af Amer 44 (L) >60 mL/min   Anion gap 10 5 - 15  Urinalysis, Routine w reflex microscopic  Result Value Ref Range   Color, Urine YELLOW YELLOW   APPearance CLEAR CLEAR   Specific Gravity, Urine 1.024 1.005 - 1.030   pH 5.0 5.0 - 8.0   Glucose, UA NEGATIVE NEGATIVE mg/dL   Hgb urine dipstick NEGATIVE NEGATIVE   Bilirubin Urine NEGATIVE NEGATIVE   Ketones, ur NEGATIVE NEGATIVE mg/dL   Protein, ur NEGATIVE NEGATIVE mg/dL   Nitrite NEGATIVE NEGATIVE   Leukocytes, UA NEGATIVE NEGATIVE   Dg Knee Complete 4 Views Right  Result Date: 12/21/2017 CLINICAL DATA:  Pain without trauma EXAM: RIGHT KNEE - COMPLETE 4+ VIEW COMPARISON:  None. FINDINGS: There is a small to moderate joint effusion. Tricompartmental degenerative changes. No acute fracture. IMPRESSION: Suprapatellar joint effusion. Tricompartmental  degenerative changes. Electronically Signed   By: Dorise Bullion III M.D   On: 12/21/2017 16:06   Dg Hip Unilat W Or W/o Pelvis 2-3 Views Right  Result Date: 12/21/2017 CLINICAL DATA:  Pain with palpation of the hips and lower extremities. History of dementia without ability to further localize the pain. No reported injury. EXAM: DG HIP (WITH OR WITHOUT PELVIS) 2-3V RIGHT COMPARISON:  Abdominal radiographs 08/21/2013. Left hip series 10/11/2009. FINDINGS: No acute fracture or right hip dislocation is identified allowing for suboptimal positioning related to external rotation of the lower extremities. Mild hip joint space narrowing is present bilaterally. Dystrophic calcifications are again noted centrally in the pelvis compatible with known fibroids. IMPRESSION: No acute osseous abnormality. Electronically Signed   By: Logan Bores M.D.   On: 12/21/2017 16:08    EKG None  Radiology Dg Knee Complete 4 Views Right  Result Date: 12/21/2017 CLINICAL DATA:  Pain without trauma EXAM: RIGHT KNEE - COMPLETE 4+ VIEW COMPARISON:  None. FINDINGS: There is a small to moderate joint effusion. Tricompartmental degenerative changes. No acute fracture. IMPRESSION: Suprapatellar joint effusion. Tricompartmental degenerative changes. Electronically Signed   By: Dorise Bullion III M.D   On: 12/21/2017 16:06   Dg Hip Unilat W Or W/o Pelvis 2-3 Views Right  Result Date: 12/21/2017 CLINICAL DATA:  Pain with palpation of the hips and lower extremities. History of dementia without ability to further localize the pain. No reported injury. EXAM: DG HIP (WITH OR WITHOUT PELVIS) 2-3V RIGHT COMPARISON:  Abdominal radiographs 08/21/2013. Left hip series 10/11/2009. FINDINGS: No acute fracture or right hip dislocation is identified allowing for suboptimal positioning related to external rotation of the lower extremities. Mild hip joint space narrowing is present bilaterally. Dystrophic calcifications are again noted centrally in  the pelvis compatible with known fibroids. IMPRESSION: No acute osseous abnormality. Electronically Signed   By: Logan Bores M.D.   On: 12/21/2017 16:08    Procedures Procedures (including critical care time)  Medications Ordered in ED Medications - No data to display   Initial Impression / Assessment and Plan / ED Course  I have reviewed the triage vital signs and the nursing notes.  Pertinent labs & imaging results that were available during my care of the patient were reviewed by me and considered in my medical decision making (see chart for details).  Labs and imaging ordered.   Reviewed nursing notes and prior charts for additional history.   Awaiting labs - pt/fam updated.  Ultram po.  Recheck, pt comfortable appearing. No distress. Afebrile.   Recheck resting quietly, no distress.  Labs reviewed - chem normal, renal fxn improved.  Imaging reviewed - neg acute.      Final Clinical Impressions(s) / ED Diagnoses   Final diagnoses:  None    ED Discharge Orders    None       Lajean Saver, MD 12/21/17 2136

## 2017-12-21 NOTE — ED Notes (Signed)
Bed: XM46 Expected date:  Expected time:  Means of arrival:  Comments: 77 yo back pain/leg pain

## 2017-12-21 NOTE — ED Notes (Signed)
[facility] called to transport pt 

## 2017-12-21 NOTE — ED Triage Notes (Signed)
Pt arrives via EMS from home. Pt is cared for by family, who reports that the pt has not been ambulating at baseline. Per family: Pt usually ambulates unassisted but since Tuesday has not been ambulating like normal. Pt has dementia. Family reported that there was no injury or fall. Pt grimaces when moved but cannot localize pain. Pt grimaces when bilateral hips, knee, and ankle joints palpated.

## 2017-12-21 NOTE — ED Notes (Signed)
Spoke With Provider about in and out cath, pt has severe pain when legs are touched. Dr. Coralyn Pear stated admin. NS and if pt unable to void in 61mins cath

## 2017-12-26 ENCOUNTER — Telehealth: Payer: Self-pay

## 2017-12-26 DIAGNOSIS — IMO0002 Reserved for concepts with insufficient information to code with codable children: Secondary | ICD-10-CM

## 2017-12-26 DIAGNOSIS — M1710 Unilateral primary osteoarthritis, unspecified knee: Secondary | ICD-10-CM

## 2017-12-26 MED ORDER — TRAMADOL HCL 50 MG PO TABS
50.0000 mg | ORAL_TABLET | Freq: Three times a day (TID) | ORAL | 1 refills | Status: DC | PRN
Start: 2017-12-26 — End: 2018-01-03

## 2017-12-26 NOTE — Telephone Encounter (Signed)
Seen in ER.  Abrupt increase in her pain.  Attributed to osteoarthritis.  I want to see, unclear to me why sudden increase.  They cannot get her to my office due to pain.  Will Rx with tramadol.  Daughter aware of potential increase in confusion.

## 2017-12-26 NOTE — Telephone Encounter (Signed)
Pts daughter called nurse line about her mother. Pts daughter stated her mother went to the ED on Saturday for severe pain. Per ER MD, pt has "arthritis" and was told to FU with PCP. Daughter states, however, they have no way to get her mother here for a visit. Pts daughter, [name], is requesting a call back from PCP.

## 2017-12-29 ENCOUNTER — Emergency Department (HOSPITAL_COMMUNITY): Payer: Medicare Other

## 2017-12-29 ENCOUNTER — Encounter (HOSPITAL_COMMUNITY): Payer: Self-pay | Admitting: Emergency Medicine

## 2017-12-29 ENCOUNTER — Inpatient Hospital Stay (HOSPITAL_COMMUNITY)
Admission: EM | Admit: 2017-12-29 | Discharge: 2018-01-03 | DRG: 603 | Disposition: A | Discharge status: Hospice | Payer: Medicare Other | Attending: Family Medicine | Admitting: Family Medicine

## 2017-12-29 DIAGNOSIS — E860 Dehydration: Secondary | ICD-10-CM | POA: Diagnosis not present

## 2017-12-29 DIAGNOSIS — I429 Cardiomyopathy, unspecified: Secondary | ICD-10-CM | POA: Diagnosis present

## 2017-12-29 DIAGNOSIS — L0390 Cellulitis, unspecified: Secondary | ICD-10-CM | POA: Diagnosis present

## 2017-12-29 DIAGNOSIS — Z66 Do not resuscitate: Secondary | ICD-10-CM | POA: Diagnosis present

## 2017-12-29 DIAGNOSIS — M79671 Pain in right foot: Secondary | ICD-10-CM

## 2017-12-29 DIAGNOSIS — N184 Chronic kidney disease, stage 4 (severe): Secondary | ICD-10-CM | POA: Diagnosis present

## 2017-12-29 DIAGNOSIS — Z7189 Other specified counseling: Secondary | ICD-10-CM

## 2017-12-29 DIAGNOSIS — I130 Hypertensive heart and chronic kidney disease with heart failure and stage 1 through stage 4 chronic kidney disease, or unspecified chronic kidney disease: Secondary | ICD-10-CM | POA: Diagnosis present

## 2017-12-29 DIAGNOSIS — F0390 Unspecified dementia without behavioral disturbance: Secondary | ICD-10-CM | POA: Diagnosis not present

## 2017-12-29 DIAGNOSIS — M810 Age-related osteoporosis without current pathological fracture: Secondary | ICD-10-CM | POA: Diagnosis not present

## 2017-12-29 DIAGNOSIS — E871 Hypo-osmolality and hyponatremia: Secondary | ICD-10-CM | POA: Diagnosis not present

## 2017-12-29 DIAGNOSIS — R64 Cachexia: Secondary | ICD-10-CM

## 2017-12-29 DIAGNOSIS — N390 Urinary tract infection, site not specified: Secondary | ICD-10-CM | POA: Diagnosis not present

## 2017-12-29 DIAGNOSIS — Z79899 Other long term (current) drug therapy: Secondary | ICD-10-CM | POA: Diagnosis not present

## 2017-12-29 DIAGNOSIS — Z515 Encounter for palliative care: Secondary | ICD-10-CM | POA: Diagnosis present

## 2017-12-29 DIAGNOSIS — F0150 Vascular dementia without behavioral disturbance: Secondary | ICD-10-CM | POA: Diagnosis present

## 2017-12-29 DIAGNOSIS — Z950 Presence of cardiac pacemaker: Secondary | ICD-10-CM

## 2017-12-29 DIAGNOSIS — I482 Chronic atrial fibrillation: Secondary | ICD-10-CM | POA: Diagnosis present

## 2017-12-29 DIAGNOSIS — Z5189 Encounter for other specified aftercare: Secondary | ICD-10-CM

## 2017-12-29 DIAGNOSIS — E46 Unspecified protein-calorie malnutrition: Secondary | ICD-10-CM | POA: Diagnosis present

## 2017-12-29 DIAGNOSIS — L03116 Cellulitis of left lower limb: Secondary | ICD-10-CM | POA: Diagnosis not present

## 2017-12-29 DIAGNOSIS — B9620 Unspecified Escherichia coli [E. coli] as the cause of diseases classified elsewhere: Secondary | ICD-10-CM | POA: Diagnosis present

## 2017-12-29 DIAGNOSIS — K219 Gastro-esophageal reflux disease without esophagitis: Secondary | ICD-10-CM | POA: Diagnosis not present

## 2017-12-29 DIAGNOSIS — R402441 Other coma, without documented Glasgow coma scale score, or with partial score reported, in the field [EMT or ambulance]: Secondary | ICD-10-CM | POA: Diagnosis not present

## 2017-12-29 DIAGNOSIS — S91301A Unspecified open wound, right foot, initial encounter: Secondary | ICD-10-CM | POA: Diagnosis not present

## 2017-12-29 DIAGNOSIS — I5032 Chronic diastolic (congestive) heart failure: Secondary | ICD-10-CM | POA: Diagnosis not present

## 2017-12-29 DIAGNOSIS — E440 Moderate protein-calorie malnutrition: Secondary | ICD-10-CM | POA: Diagnosis not present

## 2017-12-29 DIAGNOSIS — Z7401 Bed confinement status: Secondary | ICD-10-CM | POA: Diagnosis not present

## 2017-12-29 DIAGNOSIS — R52 Pain, unspecified: Secondary | ICD-10-CM | POA: Diagnosis not present

## 2017-12-29 DIAGNOSIS — G4700 Insomnia, unspecified: Secondary | ICD-10-CM | POA: Diagnosis not present

## 2017-12-29 DIAGNOSIS — L89610 Pressure ulcer of right heel, unstageable: Secondary | ICD-10-CM | POA: Diagnosis not present

## 2017-12-29 DIAGNOSIS — M5441 Lumbago with sciatica, right side: Secondary | ICD-10-CM

## 2017-12-29 DIAGNOSIS — L03115 Cellulitis of right lower limb: Secondary | ICD-10-CM | POA: Diagnosis present

## 2017-12-29 NOTE — ED Triage Notes (Signed)
Pt from home with [facility] for R heel pain and R hip pain; R heel has new drainage and darkened area to the heel; pt hx of dementia but at baseline per family; also states she took a sleeping pill tonight and is drowsy; pt family at bedside and states she was seen at [facility] last week and diagnosed with arthritis of the hip and has not walked in 2 wks

## 2017-12-30 ENCOUNTER — Inpatient Hospital Stay (HOSPITAL_COMMUNITY): Payer: Medicare Other

## 2017-12-30 ENCOUNTER — Other Ambulatory Visit: Payer: Self-pay

## 2017-12-30 DIAGNOSIS — L03115 Cellulitis of right lower limb: Secondary | ICD-10-CM

## 2017-12-30 DIAGNOSIS — M5441 Lumbago with sciatica, right side: Secondary | ICD-10-CM

## 2017-12-30 DIAGNOSIS — M79671 Pain in right foot: Secondary | ICD-10-CM | POA: Diagnosis not present

## 2017-12-30 DIAGNOSIS — Z515 Encounter for palliative care: Secondary | ICD-10-CM | POA: Diagnosis not present

## 2017-12-30 DIAGNOSIS — L89610 Pressure ulcer of right heel, unstageable: Secondary | ICD-10-CM | POA: Diagnosis present

## 2017-12-30 DIAGNOSIS — L0390 Cellulitis, unspecified: Secondary | ICD-10-CM | POA: Diagnosis present

## 2017-12-30 DIAGNOSIS — F0390 Unspecified dementia without behavioral disturbance: Secondary | ICD-10-CM | POA: Diagnosis not present

## 2017-12-30 DIAGNOSIS — Z950 Presence of cardiac pacemaker: Secondary | ICD-10-CM | POA: Diagnosis not present

## 2017-12-30 DIAGNOSIS — I482 Chronic atrial fibrillation: Secondary | ICD-10-CM | POA: Diagnosis present

## 2017-12-30 DIAGNOSIS — Z7189 Other specified counseling: Secondary | ICD-10-CM | POA: Diagnosis not present

## 2017-12-30 DIAGNOSIS — M549 Dorsalgia, unspecified: Secondary | ICD-10-CM | POA: Diagnosis not present

## 2017-12-30 DIAGNOSIS — I429 Cardiomyopathy, unspecified: Secondary | ICD-10-CM | POA: Diagnosis present

## 2017-12-30 DIAGNOSIS — I130 Hypertensive heart and chronic kidney disease with heart failure and stage 1 through stage 4 chronic kidney disease, or unspecified chronic kidney disease: Secondary | ICD-10-CM | POA: Diagnosis present

## 2017-12-30 DIAGNOSIS — E871 Hypo-osmolality and hyponatremia: Secondary | ICD-10-CM | POA: Diagnosis present

## 2017-12-30 DIAGNOSIS — E440 Moderate protein-calorie malnutrition: Secondary | ICD-10-CM | POA: Diagnosis not present

## 2017-12-30 DIAGNOSIS — M2550 Pain in unspecified joint: Secondary | ICD-10-CM | POA: Diagnosis not present

## 2017-12-30 DIAGNOSIS — Z66 Do not resuscitate: Secondary | ICD-10-CM | POA: Diagnosis present

## 2017-12-30 DIAGNOSIS — E46 Unspecified protein-calorie malnutrition: Secondary | ICD-10-CM | POA: Diagnosis present

## 2017-12-30 DIAGNOSIS — M7989 Other specified soft tissue disorders: Secondary | ICD-10-CM | POA: Diagnosis not present

## 2017-12-30 DIAGNOSIS — B9620 Unspecified Escherichia coli [E. coli] as the cause of diseases classified elsewhere: Secondary | ICD-10-CM | POA: Diagnosis present

## 2017-12-30 DIAGNOSIS — R64 Cachexia: Secondary | ICD-10-CM | POA: Diagnosis not present

## 2017-12-30 DIAGNOSIS — G4700 Insomnia, unspecified: Secondary | ICD-10-CM | POA: Diagnosis present

## 2017-12-30 DIAGNOSIS — E860 Dehydration: Secondary | ICD-10-CM | POA: Diagnosis present

## 2017-12-30 DIAGNOSIS — N184 Chronic kidney disease, stage 4 (severe): Secondary | ICD-10-CM | POA: Diagnosis present

## 2017-12-30 DIAGNOSIS — K219 Gastro-esophageal reflux disease without esophagitis: Secondary | ICD-10-CM | POA: Diagnosis present

## 2017-12-30 DIAGNOSIS — F0150 Vascular dementia without behavioral disturbance: Secondary | ICD-10-CM | POA: Diagnosis present

## 2017-12-30 DIAGNOSIS — Z79899 Other long term (current) drug therapy: Secondary | ICD-10-CM | POA: Diagnosis not present

## 2017-12-30 DIAGNOSIS — Z7401 Bed confinement status: Secondary | ICD-10-CM | POA: Diagnosis not present

## 2017-12-30 DIAGNOSIS — L03116 Cellulitis of left lower limb: Secondary | ICD-10-CM | POA: Diagnosis present

## 2017-12-30 DIAGNOSIS — N390 Urinary tract infection, site not specified: Secondary | ICD-10-CM | POA: Diagnosis present

## 2017-12-30 DIAGNOSIS — M810 Age-related osteoporosis without current pathological fracture: Secondary | ICD-10-CM | POA: Diagnosis present

## 2017-12-30 DIAGNOSIS — I5032 Chronic diastolic (congestive) heart failure: Secondary | ICD-10-CM | POA: Diagnosis present

## 2017-12-30 DIAGNOSIS — M25551 Pain in right hip: Secondary | ICD-10-CM | POA: Diagnosis not present

## 2017-12-30 LAB — COMPREHENSIVE METABOLIC PANEL
ALBUMIN: 2.9 g/dL — AB (ref 3.5–5.0)
ALK PHOS: 39 U/L (ref 38–126)
ALT: 11 U/L — ABNORMAL LOW (ref 14–54)
ANION GAP: 14 (ref 5–15)
AST: 33 U/L (ref 15–41)
BUN: 60 mg/dL — ABNORMAL HIGH (ref 6–20)
CO2: 20 mmol/L — ABNORMAL LOW (ref 22–32)
Calcium: 10.2 mg/dL (ref 8.9–10.3)
Chloride: 110 mmol/L (ref 101–111)
Creatinine, Ser: 1.67 mg/dL — ABNORMAL HIGH (ref 0.44–1.00)
GFR calc Af Amer: 33 mL/min — ABNORMAL LOW (ref >60)
GFR calc non Af Amer: 28 mL/min — ABNORMAL LOW (ref >60)
GLUCOSE: 118 mg/dL — AB (ref 65–99)
POTASSIUM: 4.8 mmol/L (ref 3.5–5.1)
SODIUM: 144 mmol/L (ref 135–145)
Total Bilirubin: 1.1 mg/dL (ref 0.3–1.2)
Total Protein: 7 g/dL (ref 6.5–8.1)

## 2017-12-30 LAB — CBC
HCT: 46.4 % — ABNORMAL HIGH (ref 36.0–46.0)
Hemoglobin: 13.5 g/dL (ref 12.0–15.0)
MCH: 28 pg (ref 26.0–34.0)
MCHC: 29.1 g/dL — AB (ref 30.0–36.0)
MCV: 96.1 fL (ref 78.0–100.0)
Platelets: 523 10*3/uL — ABNORMAL HIGH (ref 150–400)
RBC: 4.83 MIL/uL (ref 3.87–5.11)
RDW: 13 % (ref 11.5–15.5)
WBC: 15.2 10*3/uL — ABNORMAL HIGH (ref 4.0–10.5)

## 2017-12-30 LAB — CBC WITH DIFFERENTIAL/PLATELET
ABS IMMATURE GRANULOCYTES: 0.1 10*3/uL (ref 0.0–0.1)
Basophils Absolute: 0 10*3/uL (ref 0.0–0.1)
Basophils Relative: 0 %
Eosinophils Absolute: 0.1 10*3/uL (ref 0.0–0.7)
Eosinophils Relative: 1 %
HEMATOCRIT: 42.6 % (ref 36.0–46.0)
HEMOGLOBIN: 12.8 g/dL (ref 12.0–15.0)
Immature Granulocytes: 1 %
LYMPHS ABS: 0.4 10*3/uL — AB (ref 0.7–4.0)
LYMPHS PCT: 4 %
MCH: 28.6 pg (ref 26.0–34.0)
MCHC: 30 g/dL (ref 30.0–36.0)
MCV: 95.1 fL (ref 78.0–100.0)
MONO ABS: 1 10*3/uL (ref 0.1–1.0)
Monocytes Relative: 8 %
NEUTROS ABS: 10.6 10*3/uL — AB (ref 1.7–7.7)
Neutrophils Relative %: 86 %
Platelets: 501 10*3/uL — ABNORMAL HIGH (ref 150–400)
RBC: 4.48 MIL/uL (ref 3.87–5.11)
RDW: 13 % (ref 11.5–15.5)
WBC: 12.2 10*3/uL — ABNORMAL HIGH (ref 4.0–10.5)

## 2017-12-30 LAB — BASIC METABOLIC PANEL
Anion gap: 15 (ref 5–15)
BUN: 62 mg/dL — AB (ref 6–20)
CALCIUM: 10.6 mg/dL — AB (ref 8.9–10.3)
CHLORIDE: 110 mmol/L (ref 101–111)
CO2: 23 mmol/L (ref 22–32)
Creatinine, Ser: 1.65 mg/dL — ABNORMAL HIGH (ref 0.44–1.00)
GFR calc Af Amer: 33 mL/min — ABNORMAL LOW (ref >60)
GFR calc non Af Amer: 28 mL/min — ABNORMAL LOW (ref >60)
Glucose, Bld: 95 mg/dL (ref 65–99)
Potassium: 4.6 mmol/L (ref 3.5–5.1)
Sodium: 148 mmol/L — ABNORMAL HIGH (ref 135–145)

## 2017-12-30 LAB — MRSA PCR SCREENING: MRSA by PCR: NEGATIVE

## 2017-12-30 LAB — I-STAT CG4 LACTIC ACID, ED: LACTIC ACID, VENOUS: 1.35 mmol/L (ref 0.5–1.9)

## 2017-12-30 MED ORDER — POLYETHYLENE GLYCOL 3350 17 G PO PACK: 17.0000 g | PACK | Freq: Every day | ORAL | Status: DC | PRN

## 2017-12-30 MED ORDER — TRAZODONE HCL 50 MG PO TABS
25.0000 mg | ORAL_TABLET | Freq: Every evening | ORAL | Status: DC | PRN
  Administered 2017-12-30: 25 mg via ORAL
  Administered 2018-01-01: 50 mg via ORAL
  Filled 2017-12-30 (×2): qty 1

## 2017-12-30 MED ORDER — VANCOMYCIN HCL 500 MG IV SOLR
500.0000 mg | INTRAVENOUS | Status: DC
  Administered 2017-12-30 – 2017-12-31 (×2): 500 mg via INTRAVENOUS
  Filled 2017-12-30 (×4): qty 500

## 2017-12-30 MED ORDER — METOPROLOL TARTRATE 25 MG PO TABS
25.0000 mg | ORAL_TABLET | Freq: Two times a day (BID) | ORAL | Status: DC
  Administered 2017-12-30 – 2018-01-03 (×9): 25 mg via ORAL
  Filled 2017-12-30 (×12): qty 1

## 2017-12-30 MED ORDER — DONEPEZIL HCL 10 MG PO TABS
10.0000 mg | ORAL_TABLET | Freq: Every day | ORAL | Status: DC
  Administered 2017-12-30 – 2018-01-03 (×5): 10 mg via ORAL
  Filled 2017-12-30 (×6): qty 1

## 2017-12-30 MED ORDER — ACETAMINOPHEN 650 MG RE SUPP: 650.0000 mg | Freq: Four times a day (QID) | RECTAL | Status: DC | PRN

## 2017-12-30 MED ORDER — HEPARIN SODIUM (PORCINE) 5000 UNIT/ML IJ SOLN
5000.0000 [IU] | Freq: Three times a day (TID) | INTRAMUSCULAR | Status: DC
  Administered 2017-12-30 – 2018-01-03 (×15): 5000 [IU] via SUBCUTANEOUS
  Filled 2017-12-30 (×14): qty 1

## 2017-12-30 MED ORDER — TRAMADOL HCL 50 MG PO TABS
50.0000 mg | ORAL_TABLET | Freq: Two times a day (BID) | ORAL | Status: DC | PRN
  Administered 2017-12-31: 50 mg via ORAL
  Filled 2017-12-30: qty 1

## 2017-12-30 MED ORDER — ACETAMINOPHEN 325 MG PO TABS
650.0000 mg | ORAL_TABLET | Freq: Four times a day (QID) | ORAL | Status: DC | PRN
  Administered 2017-12-30 – 2018-01-02 (×3): 650 mg via ORAL
  Filled 2017-12-30 (×4): qty 2

## 2017-12-30 NOTE — Consult Note (Signed)
[facility] Nurse wound consult note Reason for Consult: Deep tissue injury to right heel.  Blood filled blister to left medial malleolus.  No osteomyelitis noted,  Wound type:pressure injury. unstageable Pressure Injury POA: Yes Measurement:Left heel:  2 cm x 2 cm intact maroon discoloration Left medial malleolus: blood filled blister 4 cm x 3.2 cm intact blistering Wound ZJQ:BHALPF heel  Blood filled blister Drainage (amount, consistency, odor) none Periwound:blistering and DTI to left heel Dressing procedure/placement/frequency:Offload pressure to left heel.  Keep blister intact.  If blister ruptures, apply mepitel silicone contact layer and cover with kerlix and tape.  If blister ruptures, may begin Prevalon offloading heel boot to right foot.  Will not follow at this time.  Please re-consult if needed.  Domenic Moras RN BSN [place] Pager [phone]

## 2017-12-30 NOTE — ED Provider Notes (Signed)
[facility] EMERGENCY DEPARTMENT Provider Note   CSN: [id number] Arrival date & time: 12/29/17  2154     History   Chief Complaint Chief Complaint  Patient presents with  . Leg Pain  . Hip Pain    HPI [name] is a 77 y.o. female. HPILevel 5 caveat due to dementia.  Patient presents with wound to right heel.  Over the last couple weeks went from fully ambulatory to bedbound.  Is been worked up for pain in her right hip and right knee.  X-rays done without a cause.  Today family members checked and found drainage on the bed and found a large swelling and tenderness to the right heel.  No fevers.  Has had somewhat decreased oral intake.  Past Medical History:  Diagnosis Date  . Atrioventricular block, complete (HCC)    s/p PPM  . Blood transfusion abn reaction or complication, no procedure mishap   . Blood transfusion without reported diagnosis   . Cardiomyopathy ([place])   . Cataract   . CHF (congestive heart failure) ([place])   . CKD (chronic kidney disease)   . Dementia   . Diastolic dysfunction 9/67/5916  . Diverticulosis 11/2005   colon, history  . Gastrointestinal hemorrhage 08/2005   Not felt to be a coumadin or ASA candidate  . GERD (gastroesophageal reflux disease)   . Gout   . Heart murmur   . History of Guillain-Barre syndrome    9/11  . Hypertension   . Hypertr obst cardiomyop   . IDA (iron deficiency anemia)   . Mild aortic sclerosis ([place])   . OA (osteoarthritis)   . Osteoporosis   . Peptic ulcer disease 08/2005  . Permanent atrial fibrillation (HCC)    prior GI bleeding- not felt to be a candidate for ASA or coumadin, chadsvasc score is at least 4  . Personal history of colonic polyps - adenomas 11/02/2013  . UGI bleed 04/2009    Patient Active Problem List   Diagnosis Date Noted  . Weight loss, unintentional 08/22/2017  . CKD (chronic kidney disease) stage 4, GFR 15-29 ml/min (HCC) 05/31/2016  . Osteoporosis 10/07/2014  .  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 Personal history of colonic polyps - adenomas 11/02/2013  . Lower GI bleed 08/22/2013  . History of gout 12/07/2010  . Hx of Guillain-Barre syndrome 03/30/2010  . Essential hypertension 12/22/2008  . Diastolic CHF, chronic ([place]) 12/22/2008  . AV BLOCK, COMPLETE 12/22/2008  . Permanent atrial fibrillation ([place]) 12/22/2008  . PEPTIC ULCER DISEASE, HX OF 12/22/2008  . PACEMAKER, PERMANENT 12/22/2008  . Iron deficiency anemia 09/19/2006  . CARDIOMYOPATHY, IDIOPATHIC 09/19/2006  . GASTROESOPHAGEAL REFLUX, NO ESOPHAGITIS 09/19/2006  . Osteoarthrosis involving lower leg 09/19/2006  . Aortic sclerosis ([place]) 09/19/2006    Past Surgical History:  Procedure Laterality Date  . [place]  . CARDIAC PACEMAKER PLACEMENT  2003/2004   , most recent generator change 5/12, MDT  . COLONOSCOPY    . FETAL BLOOD TRANSFUSION    . UPPER GASTROINTESTINAL ENDOSCOPY    . VAGOTOMY  1995     OB History   None      Home Medications    Prior to Admission medications   Medication Sig Start Date End Date Taking? Authorizing Provider  acetaminophen (TYLENOL) 500 MG tablet Take 500 mg by mouth every 6 (six) hours as needed (pain).     [provider]  calcium-vitamin D 250-100 MG-UNIT per tablet  Take 1 tablet by mouth 2 (two) times daily. 10/07/14   Zenia Resides, MD  donepezil (ARICEPT) 10 MG tablet TAKE ONE TABLET BY MOUTH AT BEDTIME 12/09/17   Zenia Resides, MD  ferrous sulfate 324 (65 FE) MG TBEC Take 1 tablet by mouth daily.    [provider]  metoprolol succinate (TOPROL-XL) 50 MG 24 hr tablet TAKE ONE TABLET BY MOUTH ONCE DAILY WITH OR IMMEDIATELY FOLLOWING A MEAL 05/22/17   Hensel, Jamal Collin, MD  traMADol (ULTRAM) 50 MG tablet Take 1 tablet (50 mg total) by mouth every 8 (eight) hours as needed for moderate pain. 12/26/17   Zenia Resides, MD  traZODone (DESYREL) 50 MG tablet Take 0.5-1  tablets (25-50 mg total) by mouth at bedtime as needed for sleep. 12/11/17   Zenia Resides, MD    Family History Family History  Problem Relation Age of Onset  . Transient ischemic attack Father   . COPD Father   . Hypertension Daughter   . Breast cancer Sister   . Colon cancer Neg Hx   . Pancreatic cancer Neg Hx   . Stomach cancer Neg Hx     Social History Social History   Tobacco Use  . Smoking status: Never Smoker  . Smokeless tobacco: Never Used  Substance Use Topics  . Alcohol use: No  . Drug use: No     Allergies   Aspirin and Orphenadrine citrate   Review of Systems Review of Systems  Unable to perform ROS: Dementia     Physical Exam Updated Vital Signs BP 136/60   Pulse 62   Temp (!) 97.5 F (36.4 C) (Axillary)   Resp 18   SpO2 99%   Physical Exam  Constitutional: She appears well-developed.  HENT:  Head: Normocephalic.  Neck: Neck supple.  Cardiovascular: Normal rate.  Pulmonary/Chest: She has no wheezes.  Abdominal: There is no tenderness.  Musculoskeletal: She exhibits tenderness.  Decreased range of motion right hip.  Tenderness to right knee with some swelling.  Bulla with discoloration to right heel.  Pulse intact in foot.  Neurological: She is alert.  Patient with baseline dementia.  Skin: Skin is warm. Capillary refill takes less than 2 seconds.       ED Treatments / Results  Labs (all labs ordered are listed, but only abnormal results are displayed) Labs Reviewed  COMPREHENSIVE METABOLIC PANEL - Abnormal; Notable for the following components:      Result Value   CO2 20 (*)    Glucose, Bld 118 (*)    BUN 60 (*)    Creatinine, Ser 1.67 (*)    Albumin 2.9 (*)    ALT 11 (*)    GFR calc non Af Amer 28 (*)    GFR calc Af Amer 33 (*)    All other components within normal limits  CBC WITH DIFFERENTIAL/PLATELET - Abnormal; Notable for the following components:   WBC 12.2 (*)    Platelets 501 (*)    Neutro Abs 10.6 (*)     Lymphs Abs 0.4 (*)    All other components within normal limits  I-STAT CG4 LACTIC ACID, ED  I-STAT CG4 LACTIC ACID, ED    EKG None  Radiology Dg Foot Complete Right  Result Date: 12/29/2017 CLINICAL DATA:  Wound check.  Right heel drainage. EXAM: RIGHT FOOT COMPLETE - 3+ VIEW COMPARISON:  None. FINDINGS: No bony destructive change, periosteal reaction or abnormal density to suggest osteomyelitis. No tracking soft tissue air  or radiopaque foreign body. Hammertoe deformities of the second through fifth digits limits assessment. No fracture. Mild midfoot osteoarthritis. Small plantar calcaneal spur. IMPRESSION: No acute osseous abnormality of the right foot. Soft tissue wound not well visualized radiographically. Electronically Signed   By: Jeb Levering M.D.   On: 12/29/2017 23:47    Procedures Procedures (including critical care time)  Medications Ordered in ED Medications - No data to display   Initial Impression / Assessment and Plan / ED Course  I have reviewed the triage vital signs and the nursing notes.  Pertinent labs & imaging results that were available during my care of the patient were reviewed by me and considered in my medical decision making (see chart for details).     Patient with new wound to right heel.  May be cellulitic.  White count is mildly elevated.  Recent x-rays of hip and knee.  X-ray does not show air or any bony changes.  However patient went from previously mobile to now bedbound.  With this and the possibility of infection I think patient would do better from admission to the hospital.  Will discuss with family practice.  Final Clinical Impressions(s) / ED Diagnoses   Final diagnoses:  Cellulitis of heel, right    ED Discharge Orders    None      Davonna Belling, MD 12/30/17 [phone]

## 2017-12-30 NOTE — ED Notes (Signed)
Attempted report x 2; name and call back number provided 

## 2017-12-30 NOTE — Progress Notes (Signed)
Pharmacy Antibiotic Note  [name] is a 77 y.o. female admitted on 12/29/2017 with wound infection.  Pharmacy has been consulted for Vancomycin dosing. Wound to the heel. WBC mildly elevated. Noted renal dysfunction and low body weight.  Plan: Vancomycin 500 mg IV q24h Trend WBC, temp, renal function  F/U infectious work-up Drug levels as indicated  Temp (24hrs), Avg:97.6 F (36.4 C), Min:97.5 F (36.4 C), Max:97.7 F (36.5 C)  Recent Labs  Lab 12/30/17 0001 12/30/17 0009  WBC 12.2*  --   CREATININE 1.67*  --   LATICACIDVEN  --  1.35    Estimated Creatinine Clearance: 20.5 mL/min (A) (by C-G formula based on SCr of 1.67 mg/dL (H)).    Allergies  Allergen Reactions  . Aspirin     REACTION: doesn't take due to trouble w/ ulcers  . Orphenadrine Citrate     UNKNOWN    [name] 12/30/2017 3:08 AM

## 2017-12-30 NOTE — Evaluation (Signed)
Clinical/Bedside Swallow Evaluation Patient Details  Name: [name] MRN: [id number] Date of Birth: 1940-12-08  Today's Date: 12/30/2017 Time: SLP Start Time (ACUTE ONLY): 8588 SLP Stop Time (ACUTE ONLY): 1305 SLP Time Calculation (min) (ACUTE ONLY): 20 min  Past Medical History:  Past Medical History:  Diagnosis Date  . Atrioventricular block, complete (HCC)    s/p PPM  . Blood transfusion abn reaction or complication, no procedure mishap   . Blood transfusion without reported diagnosis   . Cardiomyopathy ([place])   . Cataract   . CHF (congestive heart failure) ([place])   . CKD (chronic kidney disease)   . Dementia   . Diastolic dysfunction 11/22/7739  . Diverticulosis 11/2005   colon, history  . Gastrointestinal hemorrhage 08/2005   Not felt to be a coumadin or ASA candidate  . GERD (gastroesophageal reflux disease)   . Gout   . Heart murmur   . History of Guillain-Barre syndrome    9/11  . Hypertension   . Hypertr obst cardiomyop   . IDA (iron deficiency anemia)   . Mild aortic sclerosis ([place])   . OA (osteoarthritis)   . Osteoporosis   . Peptic ulcer disease 08/2005  . Permanent atrial fibrillation (HCC)    prior GI bleeding- not felt to be a candidate for ASA or coumadin, chadsvasc score is at least 4  . Personal history of colonic polyps - adenomas 11/02/2013  . UGI bleed 04/2009   Past Surgical History:  Past Surgical History:  Procedure Laterality Date  . [place]  . CARDIAC PACEMAKER PLACEMENT  2003/2004   , most recent generator change 5/12, MDT  . COLONOSCOPY    . FETAL BLOOD TRANSFUSION    . UPPER GASTROINTESTINAL ENDOSCOPY    . VAGOTOMY  1995   HPI:  77y/f patient with significant history of CHF and dementia presents with wound to right heel.  Over the last couple weeks went from fully ambulatory to bedbound.  Is been worked up for pain in her right hip and right knee.  X-rays done without a cause.  Today family members checked and found  drainage on the bed and found a large swelling and tenderness to the right heel.  No fevers.  Has had somewhat decreased oral intake and weight loss.    Assessment / Plan / Recommendation Clinical Impression  Patient lives with daughter and daughter was present for evaluation. She reports she has been feeding patient pureed foods at home. She feels she is no longer able to chew foods. Patient was presented with purees, thin liquids by cup and straw and ice chips. Patient had a 1-2 second oral hold with thin liquids, but would initiate a swallow with no s/s of aspiration. However with purees, patient held bolus orally until verbally cued to swallow.  No changes in breathing or vocal quality. Daughter also reported patient has little interest in eating and requires full assistance at home and cueing to swallow. Recommend Dys 1, thin liquid diet, medication crushed in puree. Patient will require full assistance with verbal cues to swallow. Due to advanced dementia swallowing strategies can't be taught. Education on diet and recommendations completed with daugter.  SLP Visit Diagnosis: Dysphagia, oropharyngeal phase (R13.12)    Aspiration Risk  Moderate aspiration risk;Risk for inadequate nutrition/hydration    Diet Recommendation Dysphagia 1 (Puree);Thin liquid   Liquid Administration via: Cup;Straw Medication Administration: Crushed with puree Supervision: Full supervision/cueing for compensatory strategies Compensations: Minimize environmental distractions;Slow rate;Small sips/bites Postural Changes:  Seated upright at 90 degrees;Remain upright for at least 30 minutes after po intake    Other  Recommendations Oral Care Recommendations: Oral care BID   Follow up Recommendations        Frequency and Duration min 1 x/week  1 week       Prognosis Prognosis for Safe Diet Advancement: Guarded Barriers to Reach Goals: Cognitive deficits      Swallow Study   General Date of Onset:  12/30/17 HPI: 77y/f patient with significant history of CHF and dementia presents with wound to right heel.  Over the last couple weeks went from fully ambulatory to bedbound.  Is been worked up for pain in her right hip and right knee.  X-rays done without a cause.  Today family members checked and found drainage on the bed and found a large swelling and tenderness to the right heel.  No fevers.  Has had somewhat decreased oral intake and weight loss.  Type of Study: Bedside Swallow Evaluation Previous Swallow Assessment: none Diet Prior to this Study: Regular;Thin liquids Temperature Spikes Noted: No Respiratory Status: Room air History of Recent Intubation: No Behavior/Cognition: Lethargic/Drowsy Oral Cavity Assessment: Dried secretions Oral Care Completed by SLP: Yes Oral Cavity - Dentition: Edentulous Vision: (unsure) Self-Feeding Abilities: Total assist Patient Positioning: Upright in bed Baseline Vocal Quality: Not observed Volitional Cough: (unable to elicit) Volitional Swallow: Unable to elicit    Oral/Motor/Sensory Function Overall Oral Motor/Sensory Function: Mild impairment Facial Symmetry: Within Functional Limits Facial Strength: Within Functional Limits Facial Sensation: Within Functional Limits Lingual ROM: (unable to fully assess due to lethargy)   Ice Chips Ice chips: Within functional limits Presentation: Spoon   Thin Liquid Thin Liquid: Within functional limits Presentation: Cup;Straw    Nectar Thick Nectar Thick Liquid: Not tested   Honey Thick Honey Thick Liquid: Not tested   Puree Puree: Within functional limits Presentation: Spoon   Solid   GO   Solid: Not tested       Charlynne Cousins Jaqwon Manfred, MA, CCC-SLP 12/30/2017 1:23 PM

## 2017-12-30 NOTE — ED Notes (Signed)
Attempted report x 1; name and call back number provided 

## 2017-12-30 NOTE — Plan of Care (Signed)

## 2017-12-30 NOTE — Progress Notes (Signed)
Pt had several MRI's ordered.  Pt has a pacemaker, specifically ADDRL1 pacemaker and it is NOT conditional for MRI and cannot be scanned.  Thank you.

## 2017-12-30 NOTE — H&P (Addendum)
[facility] Admission History and Physical Service Pager: [phone]  Patient name: [name] Medical record number: [id number] Date of birth: 1941-03-15 Age: 77 y.o. Gender: female  Primary Care Provider: Zenia Resides, MD Consultants: None Code Status: DNR  Chief Complaint: right foot pain and now refusing to walk  Assessment and Plan: [name] is a 77 y.o. female presenting with right foot pain that began about a few weeks ago. PMH is significant for dementia, HFpEF, HTN, and CKD IV.   Left Heel Cellulitis Acute. Vitals stable on arrival to ED. Afebrile and labs remarkable for WBC of 12.2.   Pt has lost ability to ambulate recently per history provided by her daughters. Imaging obtained in the ED and no signs of osteomyelitis on x-ray of the foot. Given her history, physical exam, and imaging most likely this is purulent cellulitis although osteomyelitis cannot be ruled out with an xray. No risk factors such as living in a nursing home or DM so staph/strep and MRSA coverage indicated.  Discussed with pharmacy and Vancomycin would be adequate.  She does not meet criteria for sepsis and clinically appeared well so no blood cultures obtained. Lactic acid was 1.35 supporting treatment of localized infection. - Admit to [place], attending Dr. Andria Frames - Vancomycin, dosing per pharmacy - Trend fever curve - Tramadol 50 mg Q12 for pain control - Tylenol prn  - daily CBC - wound care - vitals per unit routine  Dementia Chronic. On home donepezil. - Cont home med  HFpEF Chronic. Last ECHO was 12/2012 LVEF 65% mild LVH. On home Metoprolol XL 50mg  daily - continue Metoprolol 25mg  BID  HTN Chronic. Well controlled on admission at 132-136 SBP and DBP 60-90 - continue home Metoprolol  CKD IV Chronic. Scr at admission 1.67. Baseline appears to be 1.8-2.1.  - Daily BMP   FEN/GI: Reg diet Prophylaxis: Heparin  Disposition: admit to medsurg,  attending Dr. Andria Frames   History of Present Illness:  [name] is a 77 y.o. female presenting with right foot pain that began about a few weeks ago. Patient has dementia and is unable to provide history of present illness herself. Patient history is given by her two daughters, neither of which has legal power of attorney. They state a few weeks ago she began having increased pain and complains of right sided hip, knee, and foot pain. She was taken to the ED and got X-rays which were unremarkable except to show she had arthritis, which is not a new diagnosis. Over the past few days [name] has been refusing to walk and this morning her daughers found her in bed with a large discolored, swollen area on the heel area of her right foot. They also noticed some brown-yellowish discharge from the area. They state she has still been eating and drinking but less.  Review Of Systems: Per HPI with the following additions:  Review of Systems  Unable to perform ROS: Mental acuity    Patient Active Problem List   Diagnosis Date Noted  . Cellulitis 12/30/2017  . Weight loss, unintentional 08/22/2017  . CKD (chronic kidney disease) stage 4, GFR 15-29 ml/min (HCC) 05/31/2016  . Osteoporosis 10/07/2014  . 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 Personal history of colonic polyps - adenomas 11/02/2013  . Lower GI bleed 08/22/2013  . History of gout 12/07/2010  . Hx of Guillain-Barre syndrome 03/30/2010  . Essential hypertension 12/22/2008  . Diastolic CHF, chronic ([place]) 12/22/2008  .  AV BLOCK, COMPLETE 12/22/2008  . Permanent atrial fibrillation ([place]) 12/22/2008  . PEPTIC ULCER DISEASE, HX OF 12/22/2008  . PACEMAKER, PERMANENT 12/22/2008  . Iron deficiency anemia 09/19/2006  . CARDIOMYOPATHY, IDIOPATHIC 09/19/2006  . GASTROESOPHAGEAL REFLUX, NO ESOPHAGITIS 09/19/2006  . Osteoarthrosis involving lower leg 09/19/2006  . Aortic sclerosis ([place]) 09/19/2006    Past Medical  History: Past Medical History:  Diagnosis Date  . Atrioventricular block, complete (HCC)    s/p PPM  . Blood transfusion abn reaction or complication, no procedure mishap   . Blood transfusion without reported diagnosis   . Cardiomyopathy ([place])   . Cataract   . CHF (congestive heart failure) ([place])   . CKD (chronic kidney disease)   . Dementia   . Diastolic dysfunction 10/29/8117  . Diverticulosis 11/2005   colon, history  . Gastrointestinal hemorrhage 08/2005   Not felt to be a coumadin or ASA candidate  . GERD (gastroesophageal reflux disease)   . Gout   . Heart murmur   . History of Guillain-Barre syndrome    9/11  . Hypertension   . Hypertr obst cardiomyop   . IDA (iron deficiency anemia)   . Mild aortic sclerosis ([place])   . OA (osteoarthritis)   . Osteoporosis   . Peptic ulcer disease 08/2005  . Permanent atrial fibrillation (HCC)    prior GI bleeding- not felt to be a candidate for ASA or coumadin, chadsvasc score is at least 4  . Personal history of colonic polyps - adenomas 11/02/2013  . UGI bleed 04/2009    Past Surgical History: Past Surgical History:  Procedure Laterality Date  . [place]  . CARDIAC PACEMAKER PLACEMENT  2003/2004   , most recent generator change 5/12, MDT  . COLONOSCOPY    . FETAL BLOOD TRANSFUSION    . UPPER GASTROINTESTINAL ENDOSCOPY    . VAGOTOMY  1995    Social History: Social History   Tobacco Use  . Smoking status: Never Smoker  . Smokeless tobacco: Never Used  Substance Use Topics  . Alcohol use: No  . Drug use: No   Additional social history: None Please also refer to relevant sections of EMR.  Family History: Family History  Problem Relation Age of Onset  . Transient ischemic attack Father   . COPD Father   . Hypertension Daughter   . Breast cancer Sister   . Colon cancer Neg Hx   . Pancreatic cancer Neg Hx   . Stomach cancer Neg Hx     Allergies and Medications: Allergies  Allergen Reactions  .  Aspirin     REACTION: doesn't take due to trouble w/ ulcers  . Orphenadrine Citrate     UNKNOWN   No current facility-administered medications on file prior to encounter.    Current Outpatient Medications on File Prior to Encounter  Medication Sig Dispense Refill  . acetaminophen (TYLENOL) 500 MG tablet Take 500 mg by mouth every 6 (six) hours as needed (pain).     . calcium-vitamin D 250-100 MG-UNIT per tablet Take 1 tablet by mouth 2 (two) times daily.    Marland Kitchen donepezil (ARICEPT) 10 MG tablet TAKE ONE TABLET BY MOUTH AT BEDTIME 90 tablet 3  . ferrous sulfate 324 (65 FE) MG TBEC Take 1 tablet by mouth daily.    . metoprolol succinate (TOPROL-XL) 50 MG 24 hr tablet TAKE ONE TABLET BY MOUTH ONCE DAILY WITH OR IMMEDIATELY FOLLOWING A MEAL 90 tablet 3  . traMADol (ULTRAM) 50 MG tablet  Take 1 tablet (50 mg total) by mouth every 8 (eight) hours as needed for moderate pain. 90 tablet 1  . traZODone (DESYREL) 50 MG tablet Take 0.5-1 tablets (25-50 mg total) by mouth at bedtime as needed for sleep. 30 tablet 3    Objective: BP 136/60   Pulse 62   Temp (!) 97.5 F (36.4 C) (Axillary)   Resp 18   SpO2 99%  Exam: Gen: alert and oriented to name but no place or time, NAD HEENT: Normocephalic, atraumatic, PERRLA, EOMI CV: RRR no MRG  Resp: clear to auscultation Abd: non-distended, non-tender, soft, +bs MSK: Moves all four extremities Ext: no clubbing, cyanosis, or edema Neuro: CN II-XII grossly intact, gross sensation intact Skin: warm, dry, intact, no rashes; large bullous dusky colored area on the heel of the right foot, dorsalis pedis pulses intact     Labs and Imaging: CBC BMET  Recent Labs  Lab 12/30/17 0001  WBC 12.2*  HGB 12.8  HCT 42.6  PLT 501*   Recent Labs  Lab 12/30/17 0001  NA 144  K 4.8  CL 110  CO2 20*  BUN 60*  CREATININE 1.67*  GLUCOSE 118*  CALCIUM 10.2     Lactic Acid: 1.35  DG Right Foot: FINDINGS: No bony destructive change, periosteal reaction or  abnormal density to suggest osteomyelitis. IMPRESSION: No acute osseous abnormality of the right foot. Soft tissue wound not well visualized radiographically.  DG Right Knee: IMPRESSION: Suprapatellar joint effusion. Tricompartmental degenerative changes.  DG Right Hip: IMPRESSION: No acute osseous abnormality.  Nuala Alpha, DO 12/30/2017, 1:57 AM PGY-1, [facility]  I have seen and evaluated the above patient with Dr. Garlan Fillers and agree with his documentation.  I am including my edits in blue.   Lovenia Kim MD  [place] PGY-2

## 2017-12-30 NOTE — Progress Notes (Signed)
Family Medicine Progress Note  Has had marked decline last two weeks. Would like to get MRI right foot, hip, lumbar spine. Unfortunately has pacemaker which is incompatible. Will get CT w/o contrast for these areas. Acknowledge that CT foot is not standard of care imaging modality but will provide some useful information regarding how deep cellulitis goes.  Will also ask palliative care to evaluate patient. Patient refusing food for last couple of weeks. Now requiring dysphagia I diet. Seems to be approaching end stages of dementia. Otherwise management per Dr. Reesa Chew and Dr. Lowella Bandy H&P.  Guadalupe Dawn MD PGY-1 Family Medicine Resident

## 2017-12-30 NOTE — ED Notes (Signed)
Pt hitting staff when trying to obtain bp. Pt family trying to calm pt down but pt very combative. Pt refused vitals

## 2017-12-30 NOTE — Progress Notes (Signed)
Received call from MRI that Pt cannot be scanned d/t pacemaker not conditional for MRI. MD notified.

## 2017-12-31 DIAGNOSIS — M79671 Pain in right foot: Secondary | ICD-10-CM

## 2017-12-31 DIAGNOSIS — Z7189 Other specified counseling: Secondary | ICD-10-CM

## 2017-12-31 DIAGNOSIS — Z515 Encounter for palliative care: Secondary | ICD-10-CM

## 2017-12-31 LAB — CBC WITH DIFFERENTIAL/PLATELET
Abs Immature Granulocytes: 0.2 K/uL — ABNORMAL HIGH (ref 0.0–0.1)
Basophils Absolute: 0 K/uL (ref 0.0–0.1)
Basophils Relative: 0 %
Eosinophils Absolute: 0.1 K/uL (ref 0.0–0.7)
Eosinophils Relative: 0 %
HCT: 44.5 % (ref 36.0–46.0)
Hemoglobin: 13.2 g/dL (ref 12.0–15.0)
Immature Granulocytes: 1 %
Lymphocytes Relative: 3 %
Lymphs Abs: 0.6 K/uL — ABNORMAL LOW (ref 0.7–4.0)
MCH: 28.4 pg (ref 26.0–34.0)
MCHC: 29.7 g/dL — ABNORMAL LOW (ref 30.0–36.0)
MCV: 95.7 fL (ref 78.0–100.0)
Monocytes Absolute: 1.1 K/uL — ABNORMAL HIGH (ref 0.1–1.0)
Monocytes Relative: 6 %
Neutro Abs: 15.1 K/uL — ABNORMAL HIGH (ref 1.7–7.7)
Neutrophils Relative %: 90 %
Platelets: 556 K/uL — ABNORMAL HIGH (ref 150–400)
RBC: 4.65 MIL/uL (ref 3.87–5.11)
RDW: 13.2 % (ref 11.5–15.5)
WBC: 17 K/uL — ABNORMAL HIGH (ref 4.0–10.5)

## 2017-12-31 LAB — BASIC METABOLIC PANEL
Anion gap: 11 (ref 5–15)
BUN: 69 mg/dL — ABNORMAL HIGH (ref 6–20)
CHLORIDE: 112 mmol/L — AB (ref 101–111)
CO2: 25 mmol/L (ref 22–32)
Calcium: 10.3 mg/dL (ref 8.9–10.3)
Creatinine, Ser: 1.62 mg/dL — ABNORMAL HIGH (ref 0.44–1.00)
GFR calc non Af Amer: 29 mL/min — ABNORMAL LOW (ref >60)
GFR, EST AFRICAN AMERICAN: 34 mL/min — AB (ref >60)
Glucose, Bld: 114 mg/dL — ABNORMAL HIGH (ref 65–99)
POTASSIUM: 4.8 mmol/L (ref 3.5–5.1)
SODIUM: 148 mmol/L — AB (ref 135–145)

## 2017-12-31 MED ORDER — TRAMADOL HCL 50 MG PO TABS: 100.0000 mg | ORAL_TABLET | Freq: Two times a day (BID) | ORAL | Status: DC | PRN

## 2017-12-31 MED ORDER — TRAMADOL HCL 50 MG PO TABS
50.0000 mg | ORAL_TABLET | Freq: Four times a day (QID) | ORAL | Status: DC | PRN
  Administered 2018-01-02: 50 mg via ORAL
  Filled 2017-12-31: qty 1

## 2017-12-31 MED ORDER — TRAMADOL HCL 50 MG PO TABS
50.0000 mg | ORAL_TABLET | Freq: Once | ORAL | Status: AC
  Administered 2017-12-31: 50 mg via ORAL
  Filled 2017-12-31: qty 1

## 2017-12-31 MED ORDER — FENTANYL CITRATE (PF) 100 MCG/2ML IJ SOLN
12.5000 ug | INTRAMUSCULAR | Status: DC | PRN
  Administered 2017-12-31 – 2018-01-01 (×3): 12.5 ug via INTRAVENOUS
  Filled 2017-12-31 (×3): qty 2

## 2017-12-31 MED ORDER — SODIUM CHLORIDE 0.45 % IV SOLN
INTRAVENOUS | Status: AC
  Administered 2017-12-31: 11:00:00 via INTRAVENOUS

## 2017-12-31 MED ORDER — FENTANYL CITRATE (PF) 100 MCG/2ML IJ SOLN
12.5000 ug | INTRAMUSCULAR | Status: DC | PRN
  Administered 2017-12-31: 12.5 ug via INTRAVENOUS
  Filled 2017-12-31: qty 2

## 2017-12-31 NOTE — Progress Notes (Signed)
Chart reviewed and patient assessment complete. Goals of care discussed with daughters at bedside, [name] and Levada Dy. Introduced MOST form. Daughters confirm their wishes against heroic measures including resuscitation/life support/feeding tube at EOL. They agree with continuing supportive measures and attempt to treat the treatable. They are concerned about her pain. Discussed hospice options. [name] speaks of the importance of keeping her mother home and is interested in hospice services at home on discharge. RN CM notified.   Discussed with attending. PRN fentanyl ordered. Will follow.   Full note to follow.   NO CHARGE  Ihor Dow, FNP-C Palliative Medicine Team  Phone: [phone] Fax: [phone]

## 2017-12-31 NOTE — Progress Notes (Signed)
Patient chart reviewed and UA/UCx not yet collected. Overnight RN called, reported not signed out regarding need for UA/UCx so no collection. Patient has purewick, plan to collect when patient urinates. Discussed I&O cath if necessary to prevent further delay of changing ABX from vanc to unasyn. RN voiced appreciation for this communication.  Bufford Lope, DO PGY-2, [facility] 12/31/2017 9:41 PM

## 2017-12-31 NOTE — Consult Note (Signed)
Consultation Note Date: 12/31/17  Patient Name: [name]  DOB: [birth year]  MRN: [id number]  Age / Sex: 77 y.o., female  PCP: Zenia Resides, MD Referring Physician: Zenia Resides, MD  Reason for Consultation: Establishing goals of care and Terminal Care  HPI/Patient Profile: 77 y.o. female  with past medical history of dementia, CHF, HTN, CKD stage IV, osteoarthritis, pacemaker, cardiomyopathy, heart murmur, atrial fibrillation, GERD, and Guillain-barre syndrome admitted on 12/29/2017 with right foot pain and inability to walk for two weeks. Imaging in ED negative for osteomyelitis or fracture. Right heel wound possibly cellulitis versus deep tissue injury. Antibiotics initiated. WBC's increasing. Urine culture pending. Also with hyponatremia secondary to dehydration. Underlying dementia. Palliative medicine consultation for goals of care.    Clinical Assessment and Goals of Care: I have reviewed medical records, discussed with care team, and met with patient and two daughters [name] and Levada Dy) at bedside to discuss diagnosis, prognosis, GOC, EOL wishes, disposition and options.  Introduced Palliative Medicine as specialized medical care for people living with serious illness. It focuses on providing relief from the symptoms and stress of a serious illness. The goal is to improve quality of life for both the patient and the family.  We discussed a brief life review of the patient. Patient raised three children on her own, two daughters and one son. They describe her as an active and hardworking individual. Diagnosed with dementia about 4 years ago. Prior to hospitalization, living at home with [name] as primary care provider. Daughters share that her health has been declining in the last few weeks with increasing confusion/wandering and insomnia. Also with declining functional and nutritional status.    Discussed hospital diagnoses and interventions. Educated on disease trajectory of dementia. [name] and Levada Dy share that they have a good understanding of expectations with progressive dementia.   I attempted to elicit values and goals of care important to the patient. [name] becomes very tearful. [name] shares that she wishes to keep her mother home. She has worked in Psychologist, sport and exercise and will do everything she can to keep Ms. K[place] home with 24 hour care.   Advanced directives, concepts specific to code status, artifical feeding and hydration, and rehospitalization were considered and discussed. The patient does not have a documented living will or HCPOA. Explained to daughters that they are automatically HCPOA's. They tell me their mother never spoke of her wishes regarding heroic interventions. [name] and Levada Dy agree that heroic measures at EOL would not benefit her quality of life. They confirm their wishes against resuscitation, life support, or feeding tube, understanding this would only prolong the inevitable with progressive dementia. Introduced MOST form and explained importance of documenting wishes for their mother.   The difference between aggressive medical intervention and comfort care was considered in light of the patient's goals of care. Daughters agree that they do not wish to see her suffer. They are most worried about her pain control and comfort. They do wish to continue medical management for wound/possible UTI including antibiotic.Treat the  treatable.   Hospice and Palliative Care services outpatient were explained and offered. Educated on hospice options, philosophy, and eligibility. Educated on goal of comfort, quality, and dignity at EOL with focus on symptom management and preventing re-hospitalization. Daughters interested in outpatient hospice services at home when she is stable for discharge.  We discussed her pain management. They do not feel the tramadol is  helping. Also, per nursing, she pocketed pill this morning. Discussed with attending, who will order prn IV fentanyl.   Questions and concerns were addressed.  Hard Choices booklet left for review. PMT contact information given. Emotional/spiritual support provided.    SUMMARY OF RECOMMENDATIONS    The patient does not have a documented living will or HCPOA. Daughters wish against heroic interventions at EOL including no resuscitation, life support, or feeding tube. Introduced MOST form and explained importance of documenting their wishes for their mother when they are ready.   DNR/DNI. Continue current interventions/treat the treatable.   Discussed disease trajectory of dementia. Daughters have a good understanding of this and that she is likely now end-stage dementia.  Discussed palliative versus hospice options. It is VERY important for family to keep Ms. [place] home with daughter. Daughters interested in outpatient hospice services when medically ready for discharge.   PMT will follow.   Code Status/Advance Care Planning:  DNR  Symptom Management:   Attending to initiate Fentany IV prn.  May need low dose fentanyl patch.  Palliative Prophylaxis:   Aspiration, Delirium Protocol, Frequent Pain Assessment, Oral Care and Turn Reposition  Psycho-social/Spiritual:   Desire for further Chaplaincy support:yes  Additional Recommendations: Caregiving  Support/Resources and Education on Hospice  Prognosis:   Unable to determine: guarded with progressive dementia and declining functional, cognitive, and nutritional status.  Discharge Planning: Home with Hospice      Primary Diagnoses: Present on Admission: . Cellulitis   I have reviewed the medical record, interviewed the patient and family, and examined the patient. The following aspects are pertinent.  Past Medical History:  Diagnosis Date  . Atrioventricular block, complete (HCC)    s/p PPM  . Blood transfusion  abn reaction or complication, no procedure mishap   . Blood transfusion without reported diagnosis   . Cardiomyopathy ([place])   . Cataract   . CHF (congestive heart failure) ([place])   . CKD (chronic kidney disease)   . Dementia   . Diastolic dysfunction 8/85/0277  . Diverticulosis 11/2005   colon, history  . Gastrointestinal hemorrhage 08/2005   Not felt to be a coumadin or ASA candidate  . GERD (gastroesophageal reflux disease)   . Gout   . Heart murmur   . History of Guillain-Barre syndrome    9/11  . Hypertension   . Hypertr obst cardiomyop   . IDA (iron deficiency anemia)   . Mild aortic sclerosis ([place])   . OA (osteoarthritis)   . Osteoporosis   . Peptic ulcer disease 08/2005  . Permanent atrial fibrillation (HCC)    prior GI bleeding- not felt to be a candidate for ASA or coumadin, chadsvasc score is at least 4  . Personal history of colonic polyps - adenomas 11/02/2013  . UGI bleed 04/2009   Social History   Socioeconomic History  . Marital status: Divorced    Spouse name: Not on file  . Number of children: Not on file  . Years of education: Not on file  . Highest education level: Not on file  Occupational History  . Not on file  Social  Needs  . Financial resource strain: Not on file  . Food insecurity:    Worry: Not on file    Inability: Not on file  . Transportation needs:    Medical: Not on file    Non-medical: Not on file  Tobacco Use  . Smoking status: Never Smoker  . Smokeless tobacco: Never Used  Substance and Sexual Activity  . Alcohol use: No  . Drug use: No  . Sexual activity: Never  Lifestyle  . Physical activity:    Days per week: Not on file    Minutes per session: Not on file  . Stress: Not on file  Relationships  . Social connections:    Talks on phone: Not on file    Gets together: Not on file    Attends religious service: Not on file    Active member of club or organization: Not on file    Attends meetings of clubs or organizations: Not  on file    Relationship status: Not on file  Other Topics Concern  . Not on file  Social History Narrative   Lives with Daughter [name]. Has two other children, a son and daughter.   Family History  Problem Relation Age of Onset  . Transient ischemic attack Father   . COPD Father   . Hypertension Daughter   . Breast cancer Sister   . Colon cancer Neg Hx   . Pancreatic cancer Neg Hx   . Stomach cancer Neg Hx    Scheduled Meds: . donepezil  10 mg Oral QHS  . heparin  5,000 Units Subcutaneous Q8H  . metoprolol tartrate  25 mg Oral BID   Continuous Infusions: . cefTRIAXone (ROCEPHIN)  IV Stopped (01/01/18 0228)   PRN Meds:.acetaminophen **OR** acetaminophen, fentaNYL (SUBLIMAZE) injection, polyethylene glycol, traMADol, traZODone Medications Prior to Admission:  Prior to Admission medications   Medication Sig Start Date End Date Taking? Authorizing Provider  acetaminophen (TYLENOL) 500 MG tablet Take 500 mg by mouth every 6 (six) hours as needed (pain).    Yes [provider]  calcium-vitamin D 250-100 MG-UNIT per tablet Take 1 tablet by mouth 2 (two) times daily. 10/07/14  Yes Hensel, Jamal Collin, MD  donepezil (ARICEPT) 10 MG tablet TAKE ONE TABLET BY MOUTH AT BEDTIME 12/09/17  Yes Hensel, Jamal Collin, MD  ferrous sulfate 324 (65 FE) MG TBEC Take 1 tablet by mouth daily.   Yes [provider]  metoprolol succinate (TOPROL-XL) 50 MG 24 hr tablet TAKE ONE TABLET BY MOUTH ONCE DAILY WITH OR IMMEDIATELY FOLLOWING A MEAL 05/22/17  Yes Hensel, Jamal Collin, MD  traMADol (ULTRAM) 50 MG tablet Take 1 tablet (50 mg total) by mouth every 8 (eight) hours as needed for moderate pain. 12/26/17  Yes Zenia Resides, MD  traZODone (DESYREL) 50 MG tablet Take 0.5-1 tablets (25-50 mg total) by mouth at bedtime as needed for sleep. 12/11/17  Yes Hensel, Jamal Collin, MD   Allergies  Allergen Reactions  . Aspirin     REACTION: doesn't take due to trouble w/ ulcers  . Orphenadrine Citrate      UNKNOWN   Review of Systems  Unable to perform ROS: Dementia   Physical Exam  Constitutional: She is easily aroused. She appears ill.  HENT:  Head: Normocephalic and atraumatic.  Pulmonary/Chest: No accessory muscle usage. No tachypnea. No respiratory distress.  Neurological: She is easily aroused.  Wakes to voice. Does not answer questions or follow commands.  Skin: Skin is warm and dry.  Right heel deep tissue injury  Psychiatric: Her speech is delayed. Cognition and memory are impaired. She is inattentive.  Nursing note and vitals reviewed.  Vital Signs: BP 119/87   Pulse 86   Temp 97.6 F (36.4 C) (Axillary)   Resp 13   Wt 49.1 kg (108 lb 3.9 oz)   SpO2 100%   BMI 20.45 kg/m  Pain Scale: Faces   Pain Score: Asleep   SpO2: SpO2: 100 % O2 Device:SpO2: 100 % O2 Flow Rate: .   IO: Intake/output summary:   Intake/Output Summary (Last 24 hours) at 01/01/2018 3343 Last data filed at 01/01/2018 5686 Gross per 24 hour  Intake 33.33 ml  Output 330 ml  Net -296.67 ml    LBM: Last BM Date: (PTA) Baseline Weight: Weight: 49.1 kg (108 lb 3.9 oz) Most recent weight: Weight: 49.1 kg (108 lb 3.9 oz)     Palliative Assessment/Data: PPS 30%   Flowsheet Rows     Most Recent Value  Intake Tab  Referral Department  Hospitalist  Unit at Time of Referral  Med/Surg Unit  Palliative Care Primary Diagnosis  Neurology  Palliative Care Type  New Palliative care  Reason for referral  Clarify Goals of Care  Date first seen by Palliative Care  12/31/17  Clinical Assessment  Palliative Performance Scale Score  30%  Psychosocial & Spiritual Assessment  Palliative Care Outcomes  Patient/Family meeting held?  Yes  Who was at the meeting?  two daughters  Palliative Care Outcomes  Clarified goals of care, Counseled regarding hospice, Provided psychosocial or spiritual support, ACP counseling assistance, Improved pain interventions, Improved non-pain symptom therapy, Provided end of  life care assistance      Time In: 1300 Time Out: 1410 Time Total: 55mn Greater than 50%  of this time was spent counseling and coordinating care related to the above assessment and plan.  Signed by:  MIhor Dow FNP-C Palliative Medicine Team  Phone: [phone]: [phone]  Please contact Palliative Medicine Team phone at [phone] questions and concerns.  For individual provider: See AShea Evans

## 2017-12-31 NOTE — Progress Notes (Addendum)
Family Medicine Teaching Service Daily Progress Note Intern Pager: [phone]  Patient name: [name] Medical record number: [id number] Date of birth: [birth year] Age: 77 y.o. Gender: female  Primary Care Provider: Zenia Resides, MD Consultants: palliative care Code Status: dnr/dni  Pt Overview and Major Events to Date:  6/10 admitted to fpts, palliative consulted, CT R foot, hip, and lumbar spine  Assessment and Plan: [name] is a 77 y.o. female presenting with right foot pain that began about a few weeks ago. PMH is significant for dementia, HFpEF, HTN, and CKD IV.   Left Heel swelling  leukocytosis Unclear if cellulitis or perhaps pressure blister. WBC continues to increase on antibiotics but has remained afebrile. Could be some aspect of hemoconcentration as wbc and plt continue to increase. hgb stable but increased from las on 6/2. Unfortunately could not get MRI foot 2/2 incompatible pacemaker but ct of R foot, lumbar spine and R hip did not show any findings consistent with acute inability to use right foot. There was soft tissue swelling of right foot noted but no signs of osteo. Right hip with arthritis changes and lumbar with minimal foraminal stenosis. The other area to consider is the patient's end-stage dementia. Inability to walk fits into a larger picture of precipitous cognitive decline for this patient. Now not eating much food and has days where she is barely interactive. Will ask palliative care to evaluate for a goals of care discussion. Will also get urine culture to continue evaluation for leukocytosis. Given that wbc continues to increase will switch from vanc to ceftriaxone to cover for urinary flora. - vital signs per floor routine - Discontinue vancomycin - ceftriaxone 2g daily - trend fever course, wbc - tramadol 50mg  q 12 hours for pain control - daily cbc - wound care - follow up palliative recs - heparin 5000U for dvt ppx - follow up urine  cultiure  Dementia Chronic. On home donepezil. Palliative care has been consulted for goals of care discussion. Suspect patient is nearing the end of her life 2/2 end stage dementia. Speech evaluated, dysphagia 1 diet appropriate for patient. - continue aracept - dysphagia 1 diet - follow up palliative care recs  HFpEF Chronic. Last ECHO was 12/2012 LVEF 65% mild LVH. On home Metoprolol XL 50mg  daily - continue Metoprolol 25mg  BID  HTN Well controlled since admission. BP 130/92 am of 6/11. - continue home Metoprolol  CKD IV Chronic. Scr at admission 1.67. Baseline appears to be 1.8-2.1.  - Daily BMP  Hyponatremia  Uremia Likely secondary to dehydration from poor PO intake. Will give 78mL/hr of continuous 1/2 normal saline for 12 hours.  FEN/GI: dysphagia 1 diet PPx: heparin 5000U  Disposition: pending clinical course  Subjective:   Doing well this am. More awake and alert. Did not sleep last night after CT scan.  Objective: Temp:  [97.6 F (36.4 C)-98.2 F (36.8 C)] 97.6 F (36.4 C) (06/11 0617) Pulse Rate:  [67-78] 78 (06/11 0617) Resp:  [15-18] 15 (06/11 0617) BP: (126-130)/(83-92) 130/92 (06/11 0617) SpO2:  [95 %-100 %] 95 % (06/11 0617) Physical Exam: General: awake, alert, able to say 2-3 word answers such as "thank you". Thin, elderly AA female resting comfortably in bed. Cardiovascular: rrr, n m/r/g. Respiratory: lungs clear to ausculation bilaterally, no increased work of breathing Abdomen: soft, non-tender, non-distended. Right foot: Improving edema of right ankle and right heel. Still with large blister on right ankle but has improved slightly.  Laboratory: Recent Labs  Lab 12/30/17  0001 12/30/17 0459 12/31/17 0609  WBC 12.2* 15.2* 17.0*  HGB 12.8 13.5 13.2  HCT 42.6 46.4* 44.5  PLT 501* 523* 556*   Recent Labs  Lab 12/30/17 0001 12/30/17 0459 12/31/17 0609  NA 144 148* 148*  K 4.8 4.6 4.8  CL 110 110 112*  CO2 20* 23 25  BUN 60* 62* 69*   CREATININE 1.67* 1.65* 1.62*  CALCIUM 10.2 10.6* 10.3  PROT 7.0  --   --   BILITOT 1.1  --   --   ALKPHOS 39  --   --   ALT 11*  --   --   AST 33  --   --   GLUCOSE 118* 95 114*    Imaging/Diagnostic Tests: EXAM: CT OF THE RIGHT HIP WITHOUT CONTRAST  TECHNIQUE: Multidetector CT imaging of the right hip was performed according to the standard protocol. Multiplanar CT image reconstructions were also generated.  COMPARISON:  None.  FINDINGS: Bones/Joint/Cartilage  Subchondral cystic change of the femoral head likely related to full-thickness chondral fissures of the femoral head cartilage. No loss of sphericity of the femoral head or flattening. Moderate joint space narrowing of the right hip joint along the weight-bearing portion. No joint effusion or fracture.  Ligaments  Suboptimally assessed by CT.  Muscles and Tendons  No focal soft tissue mass or mineralization. No significant muscle atrophy.  Soft tissues  Fibroid uterus with coarse calcifications noted.  IMPRESSION: 1. Osteoarthritis of the right hip. Subchondral cystic change of the femoral head more often represents stigmata of full-thickness cartilaginous loss. 2. Moderate joint space narrowing of the right hip would also be in keeping with chondrosis and chondral thinning. 3. Calcified uterine fibroids.  CLINICAL DATA:  Chronic right foot pain, inflammatory arthropathy is suspected. ------------------------------------------------------------------------------------------ EXAM: CT OF THE RIGHT FOOT WITHOUT CONTRAST  TECHNIQUE: Multidetector CT imaging of the right foot was performed according to the standard protocol. Multiplanar CT image reconstructions were also generated.  COMPARISON:  Foot radiographs from 12/29/2017  FINDINGS: The study is limited by patient motion artifacts which simulate fracture deformities on reformats.  Bones/Joint/Cartilage  Osteopenic  appearance of the included ankle and foot. No frank bone destruction, fracture nor significant erosive change.  Ligaments  Suboptimally assessed by CT.  Muscles and Tendons  Generalized atrophy.  Intact tendons crossing the ankle joint.  Soft tissues  Soft tissue swelling along the medial aspect of the hindfoot.  IMPRESSION: Soft tissue swelling along the medial aspect of the hindfoot. Osteopenic appearance of the bones without acute fracture, bone destruction nor specific arthropathy. ----------------------------------------------------------------------------------------- EXAM: CT LUMBAR SPINE WITHOUT CONTRAST  TECHNIQUE: Multidetector CT imaging of the lumbar spine was performed without intravenous contrast administration. Multiplanar CT image reconstructions were also generated.  COMPARISON:  None.  FINDINGS: Segmentation: 5 lumbar type vertebrae.  Alignment: Grade 1 anterolisthesis at L4-L5 secondary to facet hypertrophy.  Vertebrae: No acute fracture.  Paraspinal and other soft tissues: Mild calcific aortic atherosclerosis. Visualized retroperitoneal organs are normal.  Disc levels: No spinal canal stenosis. There is mild bilateral L4-L5 neural foraminal stenosis due to pseudo disc bulge in the context of anterolisthesis. No other foraminal stenosis.  IMPRESSION: Grade 1 anterolisthesis at L4-L5 secondary to severe facet hypertrophy, with resultant mild bilateral neural foraminal stenosis. No acute abnormality.   Guadalupe Dawn, MD 12/31/2017, 8:57 AM PGY-1, [place] Intern pager: [phone], text pages welcome

## 2017-12-31 NOTE — Progress Notes (Deleted)
To MRI for shoulder

## 2018-01-01 DIAGNOSIS — M79671 Pain in right foot: Secondary | ICD-10-CM

## 2018-01-01 DIAGNOSIS — Z515 Encounter for palliative care: Secondary | ICD-10-CM

## 2018-01-01 LAB — URINALYSIS, ROUTINE W REFLEX MICROSCOPIC
Bilirubin Urine: NEGATIVE
GLUCOSE, UA: NEGATIVE mg/dL
Ketones, ur: NEGATIVE mg/dL
NITRITE: NEGATIVE
Protein, ur: 100 mg/dL — AB
SPECIFIC GRAVITY, URINE: 1.018 (ref 1.005–1.030)
WBC, UA: >50 WBC/hpf — ABNORMAL HIGH (ref 0–5)
pH: 5 (ref 5.0–8.0)

## 2018-01-01 LAB — COMPREHENSIVE METABOLIC PANEL
ALK PHOS: 45 U/L (ref 38–126)
ALT: 14 U/L (ref 14–54)
AST: 26 U/L (ref 15–41)
Albumin: 2.7 g/dL — ABNORMAL LOW (ref 3.5–5.0)
Anion gap: 10 (ref 5–15)
BUN: 55 mg/dL — ABNORMAL HIGH (ref 6–20)
CALCIUM: 10 mg/dL (ref 8.9–10.3)
CO2: 24 mmol/L (ref 22–32)
CREATININE: 1.34 mg/dL — AB (ref 0.44–1.00)
Chloride: 110 mmol/L (ref 101–111)
GFR, EST AFRICAN AMERICAN: 42 mL/min — AB (ref >60)
GFR, EST NON AFRICAN AMERICAN: 37 mL/min — AB (ref >60)
Glucose, Bld: 101 mg/dL — ABNORMAL HIGH (ref 65–99)
Potassium: 4.3 mmol/L (ref 3.5–5.1)
SODIUM: 144 mmol/L (ref 135–145)
Total Bilirubin: 0.5 mg/dL (ref 0.3–1.2)
Total Protein: 7.3 g/dL (ref 6.5–8.1)

## 2018-01-01 LAB — CBC WITH DIFFERENTIAL/PLATELET
Abs Immature Granulocytes: 0.2 10*3/uL — ABNORMAL HIGH (ref 0.0–0.1)
BASOS ABS: 0.1 10*3/uL (ref 0.0–0.1)
BASOS PCT: 0 %
EOS ABS: 0.1 10*3/uL (ref 0.0–0.7)
EOS PCT: 1 %
HEMATOCRIT: 44.5 % (ref 36.0–46.0)
Hemoglobin: 13.2 g/dL (ref 12.0–15.0)
IMMATURE GRANULOCYTES: 1 %
LYMPHS ABS: 0.6 10*3/uL — AB (ref 0.7–4.0)
Lymphocytes Relative: 4 %
MCH: 28.3 pg (ref 26.0–34.0)
MCHC: 29.7 g/dL — AB (ref 30.0–36.0)
MCV: 95.3 fL (ref 78.0–100.0)
Monocytes Absolute: 1 10*3/uL (ref 0.1–1.0)
Monocytes Relative: 7 %
NEUTROS PCT: 87 %
Neutro Abs: 12.7 10*3/uL — ABNORMAL HIGH (ref 1.7–7.7)
PLATELETS: 557 10*3/uL — AB (ref 150–400)
RBC: 4.67 MIL/uL (ref 3.87–5.11)
RDW: 13.2 % (ref 11.5–15.5)
WBC: 14.6 10*3/uL — AB (ref 4.0–10.5)

## 2018-01-01 MED ORDER — DIPHENHYDRAMINE HCL 50 MG/ML IJ SOLN
12.5000 mg | Freq: Three times a day (TID) | INTRAMUSCULAR | Status: DC | PRN
  Administered 2018-01-02: 12.5 mg via INTRAVENOUS
  Filled 2018-01-01: qty 1

## 2018-01-01 MED ORDER — MELATONIN 5 MG PO TABS
5.0000 mg | ORAL_TABLET | Freq: Every day | ORAL | Status: DC
  Filled 2018-01-01: qty 1

## 2018-01-01 MED ORDER — MELATONIN 3 MG PO TABS
6.0000 mg | ORAL_TABLET | Freq: Every day | ORAL | Status: DC
  Administered 2018-01-01 – 2018-01-03 (×3): 6 mg via ORAL
  Filled 2018-01-01 (×3): qty 2

## 2018-01-01 MED ORDER — SODIUM CHLORIDE 0.9 % IV SOLN
1.0000 g | Freq: Every day | INTRAVENOUS | Status: DC
  Administered 2018-01-01 (×2): 1 g via INTRAVENOUS
  Filled 2018-01-01 (×2): qty 10

## 2018-01-01 MED ORDER — QUETIAPINE FUMARATE 25 MG PO TABS
25.0000 mg | ORAL_TABLET | Freq: Every day | ORAL | Status: DC
  Administered 2018-01-01 – 2018-01-03 (×3): 25 mg via ORAL
  Filled 2018-01-01 (×3): qty 1

## 2018-01-01 NOTE — Progress Notes (Addendum)
Family Medicine Teaching Service Daily Progress Note Intern Pager: [phone]  Patient name: [name] Medical record number: [id number] Date of birth: 1940-10-08 Age: 77 y.o. Gender: female  Primary Care Provider: Zenia Resides, MD Consultants: palliative care Code Status: dnr/dni  Pt Overview and Major Events to Date:  6/10 admitted to fpts, palliative consulted, CT R foot, hip, and lumbar spine 6/11 palliative care consulted, switched to ceftriaxone  Assessment and Plan: [name] is a 77 y.o. female presenting with right foot pain that began about a few weeks ago. PMH is significant for dementia, HFpEF, HTN, and CKD IV.   Left Heel swelling  ? Urinary tract infection  leukocytosis Foot swelling decreasing, leukocytosis down from 17 to 14.6. Urine culture with large LE and mod hgb. Urine culture still pending. Switching from vancomycin to ceftriaxone to cover typical UTI pathogens. Will continue this antibiotic for at least 24 more hours given improvement. Will follow urine cultures and narrow when those results become available. Palliative care has seen patient. Appreciate their input. Family still considering MOST form. Do want infection treated at this time. Increased pain control to IV fentanyl 12.5mg  q 4 hours prn, has been well controlled on this regimen. - vital signs per floor routine - Continue ceftriaxone 2g daily (6/11- ) - fentanyl 12.5 mcg q 4 hours prn - daily cbc - wound care - follow up palliative recs - heparin 5000U for dvt ppx - follow up urine culture  Dementia On home donepezil. Palliative care has been consulted for goals of care discussion. Family open to outpatient hospice buy still contemplating MOST form. Suspect patient is nearing the end of her life 2/2 end stage dementia. Speech evaluated, dysphagia 1 diet appropriate for patient. Will switch from trazadone to seroquel for better sleep and night and to improve her sleep/wake cycle. -  continue aracept - dysphagia 1 diet - follow up palliative care recs - seroquel 25mg  nightly - melatonin 5mg  tabs  HFpEF Chronic. Last ECHO was 12/2012 LVEF 65% mild LVH. On home Metoprolol XL 50mg  daily - continue Metoprolol 25mg  BID  HTN Well controlled since admission. BP 119/87 am of 6/12. - continue home Metoprolol  CKD IIIb Improving. CKD stage IIIb on 6/12 bmp. Cr improved from 1.6 to 1.34.  - Daily BMP  Hyponatremia  Uremia Improved with limited run of fluids. Na down to 144 from 148, bun down to 55 from 69 on 6/12 bmp. - daily bmp  FEN/GI: dysphagia 1 diet PPx: heparin 5000U  Disposition: pending clinical course  Subjective:   Sleepy but easily arouseable this am. Pain well controlled.  Objective: Temp:  [97.6 F (36.4 C)-98.2 F (36.8 C)] 97.6 F (36.4 C) (06/11 1946) Pulse Rate:  [61-86] 86 (06/12 1054) Resp:  [13] 13 (06/11 1946) BP: (119-124)/(82-87) 119/87 (06/12 1054) SpO2:  [100 %] 100 % (06/11 1946) Physical Exam: General: sleep but easily arouseable AA female resting comfortably in bed, no distress Cardiovascular: regular rate rhytm, no murmurs/rubs/gallops. Respiratory: L CTAB, no increased WOB,  Abdomen: soft, non-tender, non-distended. Right foot: Improving edema of right ankle and right heel. Still with large blister on right ankle but continues to improve.  Laboratory: Recent Labs  Lab 12/30/17 0459 12/31/17 0609 01/01/18 0759  WBC 15.2* 17.0* 14.6*  HGB 13.5 13.2 13.2  HCT 46.4* 44.5 44.5  PLT 523* 556* 557*   Recent Labs  Lab 12/30/17 0001 12/30/17 0459 12/31/17 0609 01/01/18 0759  NA 144 148* 148* 144  K 4.8 4.6 4.8  4.3  CL 110 110 112* 110  CO2 20* 23 25 24   BUN 60* 62* 69* 55*  CREATININE 1.67* 1.65* 1.62* 1.34*  CALCIUM 10.2 10.6* 10.3 10.0  PROT 7.0  --   --  7.3  BILITOT 1.1  --   --  0.5  ALKPHOS 39  --   --  45  ALT 11*  --   --  14  AST 33  --   --  26  GLUCOSE 118* 95 114* 101*    Sonya Dawn,  MD 01/01/2018, 12:06 PM PGY-1, [place] Intern pager: [phone], text pages welcome

## 2018-01-01 NOTE — Progress Notes (Signed)
Daily Progress Note   Patient Name: [name]       Date: 01/01/2018 DOB: 07/10/1941  Age: 77 y.o. MRN#: [id number] Attending Physician: Zenia Resides, MD Primary Care Physician: Zenia Resides, MD Admit Date: 12/29/2017  Reason for Consultation/Follow-up: Establishing goals of care  Subjective: RN at bedside giving patient pain medication. Per family, she has been moaning in pain. Appears comfortable during my visit.    GOC:  Daughters, [name] and Levada Dy, at bedside. She did eat majority of breakfast. They tell me she was awake almost all night. They do feel IV fentanyl helps relieve her pain for a short period of time. She took PO trazodone last night but did not sleep. Discussed adding low dose Seroquel to help with sleep. Daughters agreeable. They wish to wait on consideration of fentanyl patch for now.   Levada Dy looking through MOST form during my visit. She seems overwhelmed. [name] asks to review again. [name] and Levada Dy both not their heads no in regards to heroic interventions at EOL including resuscitation, life support, or feeding tube. Daughters are not ready to complete MOST form. I reassured them to not stress over completing MOST form if they are not ready and explained that it is most important that they are here and making decisions together.   Again discussed hospice services at home on discharge. They are hopeful antibiotics will improve mental status.   Answered questions/concerns. Emotional support provided.   Length of Stay: 2  Current Medications: Scheduled Meds:  . donepezil  10 mg Oral QHS  . heparin  5,000 Units Subcutaneous Q8H  . metoprolol tartrate  25 mg Oral BID    Continuous Infusions: . cefTRIAXone (ROCEPHIN)  IV Stopped (01/01/18 0228)     PRN Meds: acetaminophen **OR** acetaminophen, fentaNYL (SUBLIMAZE) injection, polyethylene glycol, traMADol, traZODone  Physical Exam  Constitutional: She is easily aroused. She appears ill.  HENT:  Head: Normocephalic and atraumatic.  Pulmonary/Chest: No accessory muscle usage. No tachypnea. No respiratory distress.  Neurological: She is easily aroused.  Skin: Skin is warm and dry.  Psychiatric: She is noncommunicative. She is inattentive.  Nursing note and vitals reviewed.          Vital Signs: BP 119/87   Pulse 86   Temp 97.6 F (36.4 C) (Axillary)   Resp 13  Wt 49.1 kg (108 lb 3.9 oz)   SpO2 100%   BMI 20.45 kg/m  SpO2: SpO2: 100 % O2 Device: O2 Device: Room Air O2 Flow Rate:    Intake/output summary:   Intake/Output Summary (Last 24 hours) at 01/01/2018 1055 Last data filed at 01/01/2018 0900 Gross per 24 hour  Intake 83.33 ml  Output 330 ml  Net -246.67 ml   LBM: Last BM Date: (PTA) Baseline Weight: Weight: 49.1 kg (108 lb 3.9 oz) Most recent weight: Weight: 49.1 kg (108 lb 3.9 oz)       Palliative Assessment/Data: PPS 30%   Flowsheet Rows     Most Recent Value  Intake Tab  Referral Department  Hospitalist  Unit at Time of Referral  Med/Surg Unit  Palliative Care Primary Diagnosis  Neurology  Palliative Care Type  New Palliative care  Reason for referral  Clarify Goals of Care  Date first seen by Palliative Care  12/31/17  Clinical Assessment  Palliative Performance Scale Score  30%  Psychosocial & Spiritual Assessment  Palliative Care Outcomes  Patient/Family meeting held?  Yes  Who was at the meeting?  two daughters  Palliative Care Outcomes  Clarified goals of care, Counseled regarding hospice, Provided psychosocial or spiritual support, ACP counseling assistance, Improved pain interventions, Improved non-pain symptom therapy, Provided end of life care assistance      Patient Active Problem List   Diagnosis Date Noted  . Palliative care  by specialist   . Pain of right heel   . Cellulitis 12/30/2017  . Cellulitis of heel, right   . Dementia without behavioral disturbance   . Protein-calorie malnutrition, moderate ([place])   . Inanition ([place])   . Right-sided low back pain with right-sided sciatica   . Weight loss, unintentional 08/22/2017  . CKD (chronic kidney disease) stage 4, GFR 15-29 ml/min (HCC) 05/31/2016  . Osteoporosis 10/07/2014  . Goals of care, counseling/discussion 09/29/2014  . 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 Personal history of colonic polyps - adenomas 11/02/2013  . Lower GI bleed 08/22/2013  . History of gout 12/07/2010  . Hx of Guillain-Barre syndrome 03/30/2010  . Essential hypertension 12/22/2008  . Diastolic CHF, chronic ([place]) 12/22/2008  . AV BLOCK, COMPLETE 12/22/2008  . Permanent atrial fibrillation ([place]) 12/22/2008  . PEPTIC ULCER DISEASE, HX OF 12/22/2008  . PACEMAKER, PERMANENT 12/22/2008  . Iron deficiency anemia 09/19/2006  . CARDIOMYOPATHY, IDIOPATHIC 09/19/2006  . GASTROESOPHAGEAL REFLUX, NO ESOPHAGITIS 09/19/2006  . Osteoarthrosis involving lower leg 09/19/2006  . Aortic sclerosis ([place]) 09/19/2006    Palliative Care Assessment & Plan   Patient Profile: 77 y.o. female  with past medical history of dementia, CHF, HTN, CKD stage IV, osteoarthritis, pacemaker, cardiomyopathy, heart murmur, atrial fibrillation, GERD, and Guillain-barre syndrome admitted on 12/29/2017 with right foot pain and inability to walk for two weeks. Imaging in ED negative for osteomyelitis or fracture. Right heel wound possibly cellulitis versus deep tissue injury. Antibiotics initiated. WBC's increasing. Urine culture pending. Also with hyponatremia secondary to dehydration. Underlying dementia. Palliative medicine consultation for goals of care.    Assessment: Left heeling swelling--cellulitis verus deep tissue injury Leukocytosis Dementia HFpEF Hyponatremia CKD IV Right heel  pain  Recommendations/Plan:  Daughters clear on wishes against heroic interventions including resuscitation, life support, feeding tubes. Considering completing MOST form.   Watchful waiting. Treat the treatable and continue antibiotics.   Symptom management  Continue prn Fentanyl IV and Tramadol PO for pain  D/c trazodone. Start Seroquel 25mg  PO  HS  Daughters interested in hospice services at home when medically stable for discharge. RN CM notified.   Code Status: DNR/DNI   Code Status Orders  (From admission, onward)        Start     Ordered   12/30/17 0301  Do not attempt resuscitation (DNR)  Continuous    Question Answer Comment  In the event of cardiac or respiratory ARREST Do not call a "code blue"   In the event of cardiac or respiratory ARREST Do not perform Intubation, CPR, defibrillation or ACLS   In the event of cardiac or respiratory ARREST Use medication by any route, position, wound care, and other measures to relive pain and suffering. May use oxygen, suction and manual treatment of airway obstruction as needed for comfort.      12/30/17 0300    Code Status History    Date Active Date Inactive Code Status Order ID Comments User Context   08/22/2013 1417 08/24/2013 1903 Full Code [id number]  Timmothy Euler, MD Inpatient       Prognosis:   Unable to determine: guarded to poor with progressive dementia and functional, cognitive, and nutritional status decline.  Discharge Planning:  To Be Determined  Care plan was discussed with patient, daughters, Dr. Kris Mouton, RN  Thank you for allowing the Palliative Medicine Team to assist in the care of this patient.   Time In: 1055 Time Out: 1130 Total Time 52min Prolonged Time Billed  no      Greater than 50%  of this time was spent counseling and coordinating care related to the above assessment and plan.  Ihor Dow, FNP-C Palliative Medicine Team  Phone: [phone] Fax: [phone]  Please  contact Palliative Medicine Team phone at [phone] for questions and concerns.

## 2018-01-01 NOTE — Plan of Care (Signed)
  Problem: Safety: Goal: Ability to remain free from injury will improve Outcome: Progressing   Problem: Pain Managment: Goal: General experience of comfort will improve Outcome: Progressing   

## 2018-01-02 LAB — BASIC METABOLIC PANEL
ANION GAP: 8 (ref 5–15)
Anion gap: 8 (ref 5–15)
BUN: 52 mg/dL — AB (ref 6–20)
BUN: 48 mg/dL — ABNORMAL HIGH (ref 6–20)
CALCIUM: 9 mg/dL (ref 8.9–10.3)
CALCIUM: 9.3 mg/dL (ref 8.9–10.3)
CHLORIDE: 113 mmol/L — AB (ref 101–111)
CO2: 22 mmol/L (ref 22–32)
CO2: 23 mmol/L (ref 22–32)
CREATININE: 1.33 mg/dL — AB (ref 0.44–1.00)
Chloride: 114 mmol/L — ABNORMAL HIGH (ref 101–111)
Creatinine, Ser: 1.29 mg/dL — ABNORMAL HIGH (ref 0.44–1.00)
GFR calc Af Amer: 43 mL/min — ABNORMAL LOW (ref >60)
GFR calc Af Amer: 44 mL/min — ABNORMAL LOW (ref >60)
GFR calc non Af Amer: 37 mL/min — ABNORMAL LOW (ref >60)
GFR calc non Af Amer: 38 mL/min — ABNORMAL LOW (ref >60)
GLUCOSE: 77 mg/dL (ref 65–99)
GLUCOSE: 93 mg/dL (ref 65–99)
Potassium: 6.2 mmol/L — ABNORMAL HIGH (ref 3.5–5.1)
Potassium: 4.1 mmol/L (ref 3.5–5.1)
Sodium: 143 mmol/L (ref 135–145)
Sodium: 145 mmol/L (ref 135–145)

## 2018-01-02 LAB — CBC WITH DIFFERENTIAL/PLATELET
Abs Immature Granulocytes: 0.2 10*3/uL — ABNORMAL HIGH (ref 0.0–0.1)
Basophils Absolute: 0 10*3/uL (ref 0.0–0.1)
Basophils Relative: 0 %
EOS ABS: 0.1 10*3/uL (ref 0.0–0.7)
Eosinophils Relative: 1 %
HCT: 38.1 % (ref 36.0–46.0)
Hemoglobin: 11.3 g/dL — ABNORMAL LOW (ref 12.0–15.0)
IMMATURE GRANULOCYTES: 1 %
LYMPHS ABS: 0.9 10*3/uL (ref 0.7–4.0)
Lymphocytes Relative: 7 %
MCH: 28.5 pg (ref 26.0–34.0)
MCHC: 29.7 g/dL — ABNORMAL LOW (ref 30.0–36.0)
MCV: 96.2 fL (ref 78.0–100.0)
MONO ABS: 1 10*3/uL (ref 0.1–1.0)
MONOS PCT: 8 %
Neutro Abs: 10.7 10*3/uL — ABNORMAL HIGH (ref 1.7–7.7)
Neutrophils Relative %: 83 %
Platelets: UNDETERMINED 10*3/uL (ref 150–400)
RBC: 3.96 MIL/uL (ref 3.87–5.11)
RDW: 13.2 % (ref 11.5–15.5)
WBC: 12.9 10*3/uL — ABNORMAL HIGH (ref 4.0–10.5)

## 2018-01-02 MED ORDER — MORPHINE SULFATE 10 MG/5ML PO SOLN
2.5000 mg | ORAL | Status: DC | PRN
  Administered 2018-01-02 – 2018-01-03 (×3): 2.5 mg via ORAL
  Filled 2018-01-02 (×5): qty 2

## 2018-01-02 MED ORDER — DIPHENHYDRAMINE HCL 12.5 MG/5ML PO ELIX
12.5000 mg | ORAL_SOLUTION | Freq: Three times a day (TID) | ORAL | Status: DC | PRN
  Administered 2018-01-02 – 2018-01-03 (×2): 12.5 mg via ORAL
  Filled 2018-01-02 (×2): qty 10

## 2018-01-02 MED ORDER — FOSFOMYCIN TROMETHAMINE 3 G PO PACK
3.0000 g | PACK | Freq: Once | ORAL | Status: AC
  Administered 2018-01-02: 3 g via ORAL
  Filled 2018-01-02: qty 3

## 2018-01-02 MED ORDER — FENTANYL 12 MCG/HR TD PT72: 12.5000 ug | MEDICATED_PATCH | TRANSDERMAL | Status: DC

## 2018-01-02 NOTE — Progress Notes (Signed)
Daily Progress Note   Patient Name: [name]       Date: 01/02/2018 DOB: 28-Feb-1941  Age: 77 y.o. MRN#: [id number] Attending Physician: Zenia Resides, MD Primary Care Physician: Zenia Resides, MD Admit Date: 12/29/2017  Reason for Consultation/Follow-up: Establishing goals of care  Subjective/GOC: Patient resting comfortably during my visit.  Daughter, [name] at bedside. Patient received first dose of Seroquel and seemed to have a better night, per daughter. She did not require pain medication overnight. Recently given prn benadryl for itching which [name] feels is related to heparin injections. Ms. Offner had bites for breakfast.   Answered questions for [name]. She understands she can call with any concerns.   Length of Stay: 3  Current Medications: Scheduled Meds:  . donepezil  10 mg Oral QHS  . heparin  5,000 Units Subcutaneous Q8H  . Melatonin  6 mg Oral QHS  . metoprolol tartrate  25 mg Oral BID  . QUEtiapine  25 mg Oral QHS    Continuous Infusions: . cefTRIAXone (ROCEPHIN)  IV Stopped (01/02/18 0408)    PRN Meds: acetaminophen **OR** acetaminophen, diphenhydrAMINE, fentaNYL (SUBLIMAZE) injection, polyethylene glycol, traMADol  Physical Exam  Constitutional: She is easily aroused. She appears ill.  HENT:  Head: Normocephalic and atraumatic.  Pulmonary/Chest: No accessory muscle usage. No tachypnea. No respiratory distress.  Neurological: She is easily aroused.  Skin: Skin is warm and dry.  Psychiatric: She is noncommunicative. She is inattentive.  Nursing note and vitals reviewed.          Vital Signs: BP 114/70 (BP Location: Left Arm)   Pulse 60   Temp (!) 97.3 F (36.3 C) (Axillary)   Resp 12   Wt 49.1 kg (108 lb 3.9 oz)   SpO2 96%   BMI  20.45 kg/m  SpO2: SpO2: 96 % O2 Device: O2 Device: Room Air O2 Flow Rate:    Intake/output summary:   Intake/Output Summary (Last 24 hours) at 01/02/2018 1040 Last data filed at 01/02/2018 0700 Gross per 24 hour  Intake 205 ml  Output 400 ml  Net -195 ml   LBM: Last BM Date: (PTA) Baseline Weight: Weight: 49.1 kg (108 lb 3.9 oz) Most recent weight: Weight: 49.1 kg (108 lb 3.9 oz)       Palliative Assessment/Data: PPS 30%   Flowsheet Rows  Most Recent Value  Intake Tab  Referral Department  Hospitalist  Unit at Time of Referral  Med/Surg Unit  Palliative Care Primary Diagnosis  Neurology  Palliative Care Type  New Palliative care  Reason for referral  Clarify Goals of Care  Date first seen by Palliative Care  12/31/17  Clinical Assessment  Palliative Performance Scale Score  30%  Psychosocial & Spiritual Assessment  Palliative Care Outcomes  Patient/Family meeting held?  Yes  Who was at the meeting?  two daughters  Palliative Care Outcomes  Clarified goals of care, Counseled regarding hospice, Provided psychosocial or spiritual support, ACP counseling assistance, Improved pain interventions, Improved non-pain symptom therapy, Provided end of life care assistance      Patient Active Problem List   Diagnosis Date Noted  . Palliative care by specialist   . Pain of right heel   . Cellulitis 12/30/2017  . Cellulitis of heel, right   . Dementia without behavioral disturbance   . Protein-calorie malnutrition, moderate ([place])   . Inanition ([place])   . Right-sided low back pain with right-sided sciatica   . Weight loss, unintentional 08/22/2017  . CKD (chronic kidney disease) stage 4, GFR 15-29 ml/min (HCC) 05/31/2016  . Osteoporosis 10/07/2014  . Goals of care, counseling/discussion 09/29/2014  . 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 Personal history of colonic polyps - adenomas 11/02/2013  . Lower GI bleed 08/22/2013  . History of gout  12/07/2010  . Hx of Guillain-Barre syndrome 03/30/2010  . Essential hypertension 12/22/2008  . Diastolic CHF, chronic ([place]) 12/22/2008  . AV BLOCK, COMPLETE 12/22/2008  . Permanent atrial fibrillation ([place]) 12/22/2008  . PEPTIC ULCER DISEASE, HX OF 12/22/2008  . PACEMAKER, PERMANENT 12/22/2008  . Iron deficiency anemia 09/19/2006  . CARDIOMYOPATHY, IDIOPATHIC 09/19/2006  . GASTROESOPHAGEAL REFLUX, NO ESOPHAGITIS 09/19/2006  . Osteoarthrosis involving lower leg 09/19/2006  . Aortic sclerosis ([place]) 09/19/2006    Palliative Care Assessment & Plan   Patient Profile: 77 y.o. female  with past medical history of dementia, CHF, HTN, CKD stage IV, osteoarthritis, pacemaker, cardiomyopathy, heart murmur, atrial fibrillation, GERD, and Guillain-barre syndrome admitted on 12/29/2017 with right foot pain and inability to walk for two weeks. Imaging in ED negative for osteomyelitis or fracture. Right heel wound possibly cellulitis versus deep tissue injury. Antibiotics initiated. WBC's increasing. Urine culture pending. Also with hyponatremia secondary to dehydration. Underlying dementia. Palliative medicine consultation for goals of care.    Assessment: Left heeling swelling--cellulitis verus deep tissue injury Leukocytosis Dementia HFpEF Hyponatremia CKD IV Right heel pain  Recommendations/Plan:  Daughters clear on wishes against heroic interventions including resuscitation, life support, feeding tubes. Considering completing MOST form.   Watchful waiting. Treat the treatable and continue antibiotics.   Symptom management  Continue prn Fentanyl IV and Tramadol PO for pain  Seroquel 25mg  PO HS  Benadryl 12.5mg  PO q8h prn itching  Daughters interested in hospice services at home when medically stable for discharge. RN CM notified.   Code Status: DNR/DNI   Code Status Orders  (From admission, onward)        Start     Ordered   12/30/17 0301  Do not attempt resuscitation (DNR)   Continuous    Question Answer Comment  In the event of cardiac or respiratory ARREST Do not call a "code blue"   In the event of cardiac or respiratory ARREST Do not perform Intubation, CPR, defibrillation or ACLS   In the event of cardiac or respiratory ARREST Use medication  by any route, position, wound care, and other measures to relive pain and suffering. May use oxygen, suction and manual treatment of airway obstruction as needed for comfort.      12/30/17 0300    Code Status History    Date Active Date Inactive Code Status Order ID Comments User Context   08/22/2013 1417 08/24/2013 1903 Full Code [id number]  Timmothy Euler, MD Inpatient       Prognosis:   Unable to determine: guarded to poor with progressive dementia and functional, cognitive, and nutritional status decline.  Discharge Planning:  To Be Determined  Care plan was discussed with daughter  Thank you for allowing the Palliative Medicine Team to assist in the care of this patient.   Time In: 1025 Time Out: 1040 Total Time 98min Prolonged Time Billed  no      Greater than 50%  of this time was spent counseling and coordinating care related to the above assessment and plan.  Ihor Dow, FNP-C Palliative Medicine Team  Phone: [phone] Fax: [phone]  Please contact Palliative Medicine Team phone at [phone] for questions and concerns.

## 2018-01-02 NOTE — Progress Notes (Signed)
Family Medicine Teaching Service Daily Progress Note Intern Pager: [phone]  Patient name: Alexsis Kathman Medical record number: [id number] Date of birth: 13-Apr-1941 Age: 77 y.o. Gender: female  Primary Care Provider: Zenia Resides, MD Consultants: palliative care Code Status: dnr/dni  Pt Overview and Major Events to Date:  6/10 admitted to fpts, palliative consulted, CT R foot, hip, and lumbar spine 6/11 palliative care consulted, switched to ceftriaxone  Assessment and Plan: Jayelyn Barno is a 77 y.o. female presenting with right foot pain that began about a few weeks ago. PMH is significant for dementia, HFpEF, HTN, and CKD IV.   Left Heel swelling  ? Urinary tract infection  leukocytosis Foot swelling decreasing, leukocytosis down from 14.6 to 13. 70 k CFU of ecoli. Continue ctx until sensitivities back. Switching fentanyl to morphine liquids. - vital signs per floor routine - Continue ceftriaxone 2g daily (6/11- ) - morphine 2.5mg  q4 hours prn - daily cbc - wound care - follow up palliative recs - heparin 5000U for dvt ppx - follow up urine culture for sensitivities  Dementia On home donepezil. Palliative care has been consulted for goals of care discussion. Family open to outpatient hospice buy still contemplating MOST form. Suspect patient is nearing the end of her life 2/2 end stage dementia. Speech evaluated, dysphagia 1 diet appropriate for patient. Will switch from trazadone to seroquel for better sleep and night and to improve her sleep/wake cycle. - continue aracept - dysphagia 1 diet - follow up palliative care recs - seroquel 25mg  nightly - melatonin 5mg  tabs  HFpEF Chronic. Last ECHO was 12/2012 LVEF 65% mild LVH. On home Metoprolol XL 50mg  daily - continue Metoprolol 25mg  BID  HTN Well controlled since admission. BP 119/87 am of 6/12. - continue home Metoprolol  CKD IIIb Improving. CKD stage IIIb on 6/12 bmp. Cr improved from 1.6 to 1.34.  -  Daily BMP  Hyponatremia  Uremia Improved with limited run of fluids. Na down to 144 from 148, bun down to 55 from 69 on 6/12 bmp. - daily bmp  FEN/GI: dysphagia 1 diet PPx: heparin 5000U  Disposition: pending clinical course  Subjective:   Easily arousable. No complaints this am.  Objective: Temp:  [97.3 F (36.3 C)-99 F (37.2 C)] 99 F (37.2 C) (06/13 1403) Pulse Rate:  [60-64] 60 (06/13 1403) Resp:  [12-16] 16 (06/13 1403) BP: (112-118)/(62-77) 113/69 (06/13 1403) SpO2:  [96 %-100 %] 100 % (06/13 1403) Physical Exam: General: sleeping but easily arouseable African American female resting comfortably in bed, no distress Cardiovascular: rrr, no m/r/g Respiratory: L CTAB, no increased WOB,  Abdomen: soft, non-tender, non-distended. Right foot: Improving edema of right ankle and right heel. Still with large blister on right ankle but continues to improve.  Laboratory: Recent Labs  Lab 12/31/17 0609 01/01/18 0759 01/02/18 0544  WBC 17.0* 14.6* 12.9*  HGB 13.2 13.2 11.3*  HCT 44.5 44.5 38.1  PLT 556* 557* PLATELET CLUMPS NOTED ON SMEAR, UNABLE TO ESTIMATE   Recent Labs  Lab 12/30/17 0001  01/01/18 0759 01/02/18 0544 01/02/18 1105  NA 144   < > 144 143 145  K 4.8   < > 4.3 6.2* 4.1  CL 110   < > 110 113* 114*  CO2 20*   < > 24 22 23   BUN 60*   < > 55* 52* 48*  CREATININE 1.67*   < > 1.34* 1.33* 1.29*  CALCIUM 10.2   < > 10.0 9.0 9.3  PROT 7.0  --  7.3  --   --   BILITOT 1.1  --  0.5  --   --   ALKPHOS 39  --  45  --   --   ALT 11*  --  14  --   --   AST 33  --  26  --   --   GLUCOSE 118*   < > 101* 77 93   < > = values in this interval not displayed.    Guadalupe Dawn, MD 01/02/2018, 5:20 PM PGY-1, [place] Intern pager: [phone], text pages welcome

## 2018-01-02 NOTE — Care Management Important Message (Signed)
Important Message  Patient Details  Name: [name] MRN: [id number] Date of Birth: [birth year]   Medicare Important Message Given:  Yes    [name] [address] 01/02/2018, 2:59 PM

## 2018-01-02 NOTE — Consult Note (Signed)
[facility] CM Primary Care Navigator  01/02/2018  Buffi Ewton 12-03-1940 [id number]   Met with patient and daughter [name]) at the bedside to identify possible discharge needs. Daughter reportsthat patient was having"increased pain to right leg/ hip and large wound blister to right heel" that had led to this admission. (left heel cellulitis, leukocytosis)  Patient's daughterendorses Dr.William Hensel with [place] as theprimary care provider.   [facility] [address] to obtain medications without any difficulty.   Patient's daughter- [name] has been managingher medications at home using "pill box" system filled weekly.  Patient's daughters Bronson Curb and [name]) have beendriving andproviding transportationto herdoctors'appointments.  According to daughter,she lives with patient and serves as the primary caregiver for her at home.   Anticipated discharge plan is pending clinical improvement per MD, but daughter was interested in hospice services at home on discharge, per Palliative care note.  No further care management needs noted at this point.  [facility] care management information provided for future needs thatpatient may have.   For additional questions please contact:  Edwena Felty A. Beda Dula, BSN, RN-BC [facility] PRIMARY CARE Navigator Cell: [phone]

## 2018-01-03 ENCOUNTER — Telehealth: Payer: Self-pay

## 2018-01-03 DIAGNOSIS — Z515 Encounter for palliative care: Secondary | ICD-10-CM

## 2018-01-03 LAB — CBC
HEMATOCRIT: 39.2 % (ref 36.0–46.0)
HEMOGLOBIN: 11.8 g/dL — AB (ref 12.0–15.0)
MCH: 28.6 pg (ref 26.0–34.0)
MCHC: 30.1 g/dL (ref 30.0–36.0)
MCV: 94.9 fL (ref 78.0–100.0)
Platelets: 492 10*3/uL — ABNORMAL HIGH (ref 150–400)
RBC: 4.13 MIL/uL (ref 3.87–5.11)
RDW: 13.2 % (ref 11.5–15.5)
WBC: 15.4 10*3/uL — ABNORMAL HIGH (ref 4.0–10.5)

## 2018-01-03 LAB — URINE CULTURE: Culture: 70000 — AB

## 2018-01-03 MED ORDER — MORPHINE SULFATE 10 MG/5ML PO SOLN: 2.5000 mg | ORAL | 0 refills | Status: AC | PRN

## 2018-01-03 MED ORDER — POLYETHYLENE GLYCOL 3350 17 G PO PACK: 17.0000 g | PACK | Freq: Every day | ORAL | 0 refills | Status: AC | PRN

## 2018-01-03 MED ORDER — QUETIAPINE FUMARATE 25 MG PO TABS: 25.0000 mg | ORAL_TABLET | Freq: Every day | ORAL | 0 refills | Status: AC

## 2018-01-03 NOTE — Discharge Summary (Signed)
[facility] Discharge Summary  Patient name: [name] Medical record number: [id number] Date of birth: [birth year] Age: 77 y.o. Gender: female Date of Admission: 12/29/2017  Date of Discharge: 01/03/2018 Admitting Physician: Zenia Resides, MD  Primary Care Provider: Zenia Resides, MD Consultants: palliative care  Indication for Hospitalization: inability to walk  Discharge Diagnoses/Problem List:  Left heel swelling Dementia HFpEF HTN CKD IIIb Hyponatremia Uremia Protein calorie malnutrition  Disposition: home hospice  Discharge Condition: stable  Discharge Exam: General: sleeping but easily arouseable African American female resting comfortably in bed, no distress Cardiovascular: rrr, no m/r/g Respiratory: L CTAB, no increased WOB,  Abdomen: soft, non-tender, non-distended. Right foot: Improving edema of right ankle and right heel. Still with large blister on right ankle but continues to improve.  Brief Hospital Course:  UTI 77 year old who presented on 6/10 due to 2 week history of cognitive functional decline and inability to use right foot. Patient found to have large blister on right heel. Initially started on vanc and zosyn as she met sepsis criteria. Urinalysis take at admission grew out 70k CFU of e coli and patient was transitioned to ctx on 6/11. It was found to be pansenstive so she was given a dose of fosfomycin on 6/14. He treatment was considered complete at that time.  Dementia Patient noted to be refusing food and not being interested in eating. She spent most of her time sleeping and resting comfortably. Felt to be in end-stages of dementia. She was seen by palliative care and was deemed to be a candidate for home hospice. She was sent home on morphine liquid for pain control, seroquel to help with nighttime sleep/agitation.  Pressure wound on right heel Improved over course of hospitalization. Wound care evaluated.  Recommended keeping pressure off and keeping it wrapped.   Issues for Follow Up:  1. Home with home hospice 2. Continue to follow mental status 3. Ensure pain well controlled  Significant Procedures: none  Significant Labs and Imaging:  Recent Labs  Lab 01/01/18 0759 01/02/18 0544 01/03/18 0447  WBC 14.6* 12.9* 15.4*  HGB 13.2 11.3* 11.8*  HCT 44.5 38.1 39.2  PLT 557* PLATELET CLUMPS NOTED ON SMEAR, UNABLE TO ESTIMATE 492*   Recent Labs  Lab 12/30/17 0001 12/30/17 0459 12/31/17 0609 01/01/18 0759 01/02/18 0544 01/02/18 1105  NA 144 148* 148* 144 143 145  K 4.8 4.6 4.8 4.3 6.2* 4.1  CL 110 110 112* 110 113* 114*  CO2 20* '23 25 24 22 23  '$ GLUCOSE 118* 95 114* 101* 77 93  BUN 60* 62* 69* 55* 52* 48*  CREATININE 1.67* 1.65* 1.62* 1.34* 1.33* 1.29*  CALCIUM 10.2 10.6* 10.3 10.0 9.0 9.3  ALKPHOS 39  --   --  45  --   --   AST 33  --   --  26  --   --   ALT 11*  --   --  14  --   --   ALBUMIN 2.9*  --   --  2.7*  --   --      Results/Tests Pending at Time of Discharge:   Discharge Medications:  Allergies as of 01/03/2018      Reactions   Aspirin    REACTION: doesn't take due to trouble w/ ulcers   Orphenadrine Citrate    UNKNOWN      Medication List    STOP taking these medications   traMADol 50 MG tablet Commonly known as:  ULTRAM   traZODone 50 MG tablet Commonly known as:  DESYREL     TAKE these medications   acetaminophen 500 MG tablet Commonly known as:  TYLENOL Take 500 mg by mouth every 6 (six) hours as needed (pain).   calcium-vitamin D 250-100 MG-UNIT tablet Take 1 tablet by mouth 2 (two) times daily.   donepezil 10 MG tablet Commonly known as:  ARICEPT TAKE ONE TABLET BY MOUTH AT BEDTIME   ferrous sulfate 324 (65 Fe) MG Tbec Take 1 tablet by mouth daily.   metoprolol succinate 50 MG 24 hr tablet Commonly known as:  TOPROL-XL TAKE ONE TABLET BY MOUTH ONCE DAILY WITH OR IMMEDIATELY FOLLOWING A MEAL   morphine 10 MG/5ML solution Take  1.3 mLs (2.6 mg total) by mouth every 4 (four) hours as needed for severe pain.   polyethylene glycol packet Commonly known as:  MIRALAX / GLYCOLAX Take 17 g by mouth daily as needed for mild constipation.   QUEtiapine 25 MG tablet Commonly known as:  SEROQUEL Take 1 tablet (25 mg total) by mouth at bedtime.            Durable Medical Equipment  (From admission, onward)        Start     Ordered   01/03/18 1047  For home use only DME Hospital bed  Once    Question Answer Comment  The above medical condition requires: Patient requires the ability to reposition frequently   Head must be elevated greater than: 30 degrees   Bed type Semi-electric      01/03/18 1048      Discharge Instructions: Please refer to Patient Instructions section of EMR for full details.  Patient was counseled important signs and symptoms that should prompt return to medical care, changes in medications, dietary instructions, activity restrictions, and follow up appointments.   Follow-Up Appointments: Follow-up Information    [facility] Follow up.   Why:  A representative from [facility] will contact you after your discharge from the hospital  Contact information: [place]          Guadalupe Dawn, MD 01/03/2018, 2:30 PM PGY-1, [place]

## 2018-01-03 NOTE — Progress Notes (Signed)
[facility] has picked up pt and transporting home.

## 2018-01-03 NOTE — Telephone Encounter (Signed)
Amber with [facility] left message stating patient being discharged with Hospice Services. Patient has requested at PCP be listed as attending physician of record. Needs to know if PCP agrees.   Call back is [phone]  Danley Danker, RN [facility])

## 2018-01-03 NOTE — Care Management Note (Signed)
Case Management Note  Patient Details  Name: Omaya Nieland MRN: [id number] Date of Birth: [birth year]  Subjective/Objective:                    Action/Plan:  Case manager spoke with patient's daughter [name] concerning discharge plan. Family has decided to take their mom home with Hospice services. Case manager provided choice of Hospice agencies. Referral was called to [facility]. CM spoke with Amy, Hospice Liaison. Case Manager will fax discharge summary and DNR to Hospice at [phone]. Patient will discharge home after bed has been delivered. Patient will need to be transported home via [facility]. Should it be after 5pm bedside RN will need to call [phone], press 1 then select option 3.      Expected Discharge Date:    01/03/18              Expected Discharge Plan:  Home w Hospice Care  In-House Referral:  Hospice / Palliative Care  Discharge planning Services  CM Consult  Post Acute Care Choice:  Hospice Choice offered to:  Adult Children  DME Arranged:  Hospice Equipment Package A DME Agency:  Other - Comment  HH Arranged:  NA HH Agency:  [facility]  Status of Service:  Completed, signed off  If discussed at [place] of Stay Meetings, dates discussed:    Additional Comments:  Ninfa Meeker, RN 01/03/2018, 12:01 PM

## 2018-01-03 NOTE — Progress Notes (Addendum)
[facility] Liaison:  RN visit at 11:00am.  Notified by Skip Mayer, of patient/family request for [facility] at home after discharge.  Chart and patient information are being reviewed with [facility] physician.  Hospice eligibility approved by Dr. Karie Georges.  Writer spoke with Levada Dy and [name], daughters, at bedside to initiate education related to hospice philosophy, services and team approach to care.  Family verbalized understanding of information given.  Per discussion, plan is for discharge to home by [facility] on 01/03/18.  Please send signed and completed DNR form home with patient/family.  Patient will need prescriptions for discharge comfort medications.  DME needs have been discussed, patient currently has the following equipment in the home:  Walker and cane.  Patient/family requests the following DME for delivery to the home hospital bed 1/2 rails, OBT, 3 in 1.  [facility] equipment manager, Frann Rider, has been notified and will contact [facility] to arrange delivery to the home.  The home address has been verified and is correct in the chart.  [name], daughter, is the family member to contact to arrange time of delivery at [phone].  [facility] is aware of the above.  Completed discharge summary will need to be faxed to [facility] at [phone], when final.  Please notify [facility] when patient is ready to leave the unit at discharge.  (Call [phone] or [phone] if its after 5pm.)  [facility] information and contact numbers have been given to daughters during visit.  Above information shared with Manuela Schwartz [facility]. Please call with any hospice related questions.  Thank you for the referral.  Edyth Gunnels, RN, [facility] Liaison [phone]  All hospital liaisons are now on [place].

## 2018-01-03 NOTE — Progress Notes (Signed)
SLP Cancellation Note  Patient Details Name: [name] MRN: [id number] DOB: 03-05-1941   Cancelled treatment:       Reason Eval/Treat Not Completed: Other (comment). Given plan of care and d/c home with hospice no further SLP needs at this time. Diet is appropriate, education completed last session.    Jaquel Glassburn, Katherene Ponto 01/03/2018, 2:40 PM

## 2018-01-03 NOTE — Progress Notes (Signed)
Daily Progress Note   Patient Name: [name]       Date: 01/03/2018 DOB: Nov 10, 1940  Age: 77 y.o. MRN#: [id number] Attending Physician: Zenia Resides, MD Primary Care Physician: Zenia Resides, MD Admit Date: 12/29/2017  Reason for Consultation/Follow-up: Establishing goals of care  Subjective/GOC: Patient resting comfortably during my visit. No s/s of distress.    Daughter, [name], at bedside. [name] slept well last night with Seroquel and Melatonin. [name] was able to get her to eat some breakfast. We discussed the plan for discharge today. Educated again on role of hospice services and philosophy. Educated on symptom management medications to ensure relief from pain or suffering. Answered questions and concerns. Emotional support provided to this wonderful daughter who's most important goal is to care for her mother at home at EOL.   Length of Stay: 4  Current Medications: Scheduled Meds:  . donepezil  10 mg Oral QHS  . heparin  5,000 Units Subcutaneous Q8H  . Melatonin  6 mg Oral QHS  . metoprolol tartrate  25 mg Oral BID  . QUEtiapine  25 mg Oral QHS    Continuous Infusions:   PRN Meds: acetaminophen **OR** acetaminophen, diphenhydrAMINE, morphine, polyethylene glycol, traMADol  Physical Exam  Constitutional: She is easily aroused. She appears ill.  HENT:  Head: Normocephalic and atraumatic.  Pulmonary/Chest: No accessory muscle usage. No tachypnea. No respiratory distress.  Neurological: She is easily aroused.  Skin: Skin is warm and dry.  Psychiatric: She is noncommunicative. She is inattentive.  Nursing note and vitals reviewed.          Vital Signs: BP 121/72 (BP Location: Left Arm)   Pulse 64   Temp 99 F (37.2 C) (Oral)   Resp 16   Ht 5'  1" (1.549 m)   Wt 49.1 kg (108 lb 3.9 oz)   SpO2 100%   BMI 20.45 kg/m  SpO2: SpO2: 100 % O2 Device: O2 Device: Room Air O2 Flow Rate:    Intake/output summary:   Intake/Output Summary (Last 24 hours) at 01/03/2018 1029 Last data filed at 01/03/2018 0700 Gross per 24 hour  Intake 300 ml  Output 400 ml  Net -100 ml   LBM: Last BM Date: (PTA) Baseline Weight: Weight: 49.1 kg (108 lb 3.9 oz) Most recent weight: Weight: 49.1 kg (  108 lb 3.9 oz)       Palliative Assessment/Data: PPS 30%   Flowsheet Rows     Most Recent Value  Intake Tab  Referral Department  Hospitalist  Unit at Time of Referral  Med/Surg Unit  Palliative Care Primary Diagnosis  Neurology  Palliative Care Type  New Palliative care  Reason for referral  Clarify Goals of Care  Date first seen by Palliative Care  12/31/17  Clinical Assessment  Palliative Performance Scale Score  30%  Psychosocial & Spiritual Assessment  Palliative Care Outcomes  Patient/Family meeting held?  Yes  Who was at the meeting?  two daughters  Palliative Care Outcomes  Clarified goals of care, Counseled regarding hospice, Provided psychosocial or spiritual support, ACP counseling assistance, Improved pain interventions, Improved non-pain symptom therapy, Provided end of life care assistance      Patient Active Problem List   Diagnosis Date Noted  . Palliative care by specialist   . Pain of right heel   . Cellulitis 12/30/2017  . Cellulitis of heel, right   . Dementia without behavioral disturbance   . Protein-calorie malnutrition, moderate ([place])   . Inanition ([place])   . Right-sided low back pain with right-sided sciatica   . Weight loss, unintentional 08/22/2017  . CKD (chronic kidney disease) stage 4, GFR 15-29 ml/min (HCC) 05/31/2016  . Osteoporosis 10/07/2014  . Goals of care, counseling/discussion 09/29/2014  . 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 Personal history of colonic polyps - adenomas  11/02/2013  . Lower GI bleed 08/22/2013  . History of gout 12/07/2010  . Hx of Guillain-Barre syndrome 03/30/2010  . Essential hypertension 12/22/2008  . Diastolic CHF, chronic ([place]) 12/22/2008  . AV BLOCK, COMPLETE 12/22/2008  . Permanent atrial fibrillation ([place]) 12/22/2008  . PEPTIC ULCER DISEASE, HX OF 12/22/2008  . PACEMAKER, PERMANENT 12/22/2008  . Iron deficiency anemia 09/19/2006  . CARDIOMYOPATHY, IDIOPATHIC 09/19/2006  . GASTROESOPHAGEAL REFLUX, NO ESOPHAGITIS 09/19/2006  . Osteoarthrosis involving lower leg 09/19/2006  . Aortic sclerosis ([place]) 09/19/2006    Palliative Care Assessment & Plan   Patient Profile: 77 y.o. female  with past medical history of dementia, CHF, HTN, CKD stage IV, osteoarthritis, pacemaker, cardiomyopathy, heart murmur, atrial fibrillation, GERD, and Guillain-barre syndrome admitted on 12/29/2017 with right foot pain and inability to walk for two weeks. Imaging in ED negative for osteomyelitis or fracture. Right heel wound possibly cellulitis versus deep tissue injury. Antibiotics initiated. WBC's increasing. Urine culture pending. Also with hyponatremia secondary to dehydration. Underlying dementia. Palliative medicine consultation for goals of care.    Assessment: Left heeling swelling--cellulitis verus deep tissue injury Leukocytosis Dementia HFpEF Hyponatremia CKD IV Right heel pain  Recommendations/Plan:  Daughters clear on wishes against heroic interventions including resuscitation, life support, feeding tubes.  Daughters have a good understanding of expectations of progressive dementia and that she is end-stage.   Plan is for discharge home with hospice services.   Recommend continuing Seroquel HS and Roxanol prn at home with hospice.   Code Status: DNR/DNI   Code Status Orders  (From admission, onward)        Start     Ordered   12/30/17 0301  Do not attempt resuscitation (DNR)  Continuous    Question Answer Comment  In the  event of cardiac or respiratory ARREST Do not call a "code blue"   In the event of cardiac or respiratory ARREST Do not perform Intubation, CPR, defibrillation or ACLS   In the event of cardiac  or respiratory ARREST Use medication by any route, position, wound care, and other measures to relive pain and suffering. May use oxygen, suction and manual treatment of airway obstruction as needed for comfort.      12/30/17 0300    Code Status History    Date Active Date Inactive Code Status Order ID Comments User Context   08/22/2013 1417 08/24/2013 1903 Full Code [id number]  Timmothy Euler, MD Inpatient       Prognosis:   Likely weeks-months with progressive dementia and functional, cognitive, and nutritional status decline.  Discharge Planning:  Home with Hospice  Care plan was discussed with daughter  Thank you for allowing the Palliative Medicine Team to assist in the care of this patient.   Time In: 1015 Time Out: 1030 Total Time 52min Prolonged Time Billed  no      Greater than 50%  of this time was spent counseling and coordinating care related to the above assessment and plan.  Ihor Dow, FNP-C Palliative Medicine Team  Phone: [phone] Fax: [phone]  Please contact Palliative Medicine Team phone at [phone] for questions and concerns.

## 2018-01-03 NOTE — Progress Notes (Signed)
Called [facility] to transport patient home.

## 2018-01-06 NOTE — Telephone Encounter (Signed)
Amber calls back to check status.  They have a nurse who can go out to see pt today if Dr. Andria Frames is agreeable to being the physician to sign her orders.  Please call Amber back and let her know. [place], Salome Spotted, CMA

## 2018-01-06 NOTE — Telephone Encounter (Signed)
Amber calls back and needs an answer so that the nurse can go out today.   Spoke with Dr. Nori Riis who gave the verbal. Rashed Edler, Salome Spotted, [place]

## 2018-01-16 ENCOUNTER — Telehealth: Payer: Self-pay

## 2018-01-16 MED ORDER — FLUCONAZOLE 150 MG PO TABS: 150.0000 mg | ORAL_TABLET | Freq: Once | ORAL | 0 refills | Status: AC

## 2018-01-16 NOTE — Telephone Encounter (Signed)
Tonya with [facility] called to report patient has a white vaginal discharge with itching and wants PCPs recommendation for treatment.  Call back [phone]  Danley Danker, RN [facility] [employer])

## 2018-01-16 NOTE — Telephone Encounter (Signed)
Poke with Tanya.  Will Rx empirically for yeast vaginitis

## 2018-01-27 ENCOUNTER — Telehealth: Payer: Self-pay

## 2018-01-27 NOTE — Telephone Encounter (Signed)
Crystal with [employer] [facility] called to let PCP know patient has passed away and ask if PCP will sign death certificate. Spoke with PCP and he will sign. Crystal aware and will bring it by.  Danley Danker, RN [facility] RN)

## 2018-01-28 ENCOUNTER — Telehealth: Payer: Self-pay | Admitting: Family Medicine

## 2018-01-28 NOTE — Telephone Encounter (Signed)
Death Certificate was dropped off and has been placed in PCP's box for completion. Please use blue or black ink, (no highlights, strike throughs or white out) Please return to [place] once completed.

## 2018-01-29 NOTE — Telephone Encounter (Signed)
Done

## 2018-01-30 ENCOUNTER — Telehealth: Payer: Self-pay | Admitting: Cardiology

## 2018-01-30 ENCOUNTER — Encounter: Payer: Medicare Other | Admitting: *Deleted

## 2018-01-30 NOTE — Telephone Encounter (Signed)
Confirmed remote transmission w/ pt daughter. Pt daughter informed me that patient passed away on Saturday February 12, 2018.

## 2018-02-20 DEATH — deceased

## 2019-03-24 DEATH — deceased

## 2019-05-03 IMAGING — CT CT FOOT*R* W/O CM
1 of 4 series · 7 of 14 positions shown, 9 images · non-contrast
Comparison: Foot radiographs from 12/29/2017

CLINICAL DATA: Chronic right foot pain, inflammatory arthropathy is
suspected.

EXAM:
CT OF THE RIGHT FOOT WITHOUT CONTRAST
TECHNIQUE: Multidetector CT imaging of the right foot was performed according
to the standard protocol. Multiplanar CT image reconstructions were
also generated.

[Series 5: footuhr 0.6 u90u · axial · 0.33mm/px · z∈[+48,+220]mm · 7 of 573 slices shown, 9 images]
[im 72/573  soft-tissue]
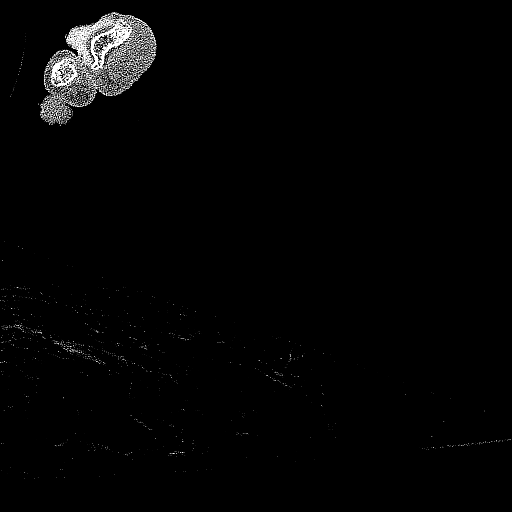
[im 72/573  bone]
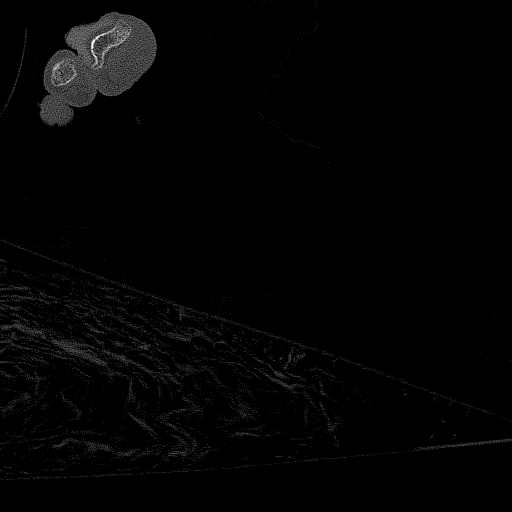
[im 144/573  bone]
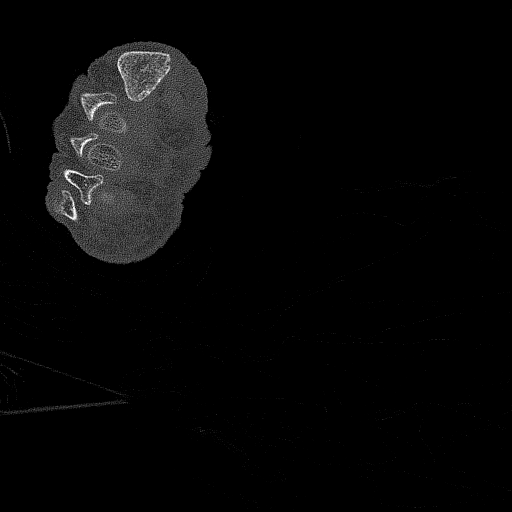
[im 215/573  bone]
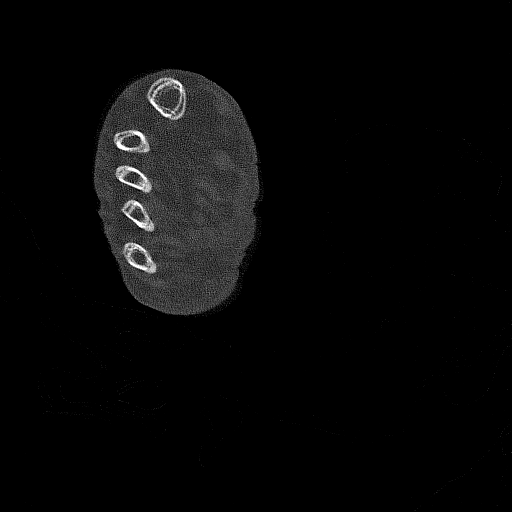
[im 287/573  bone]
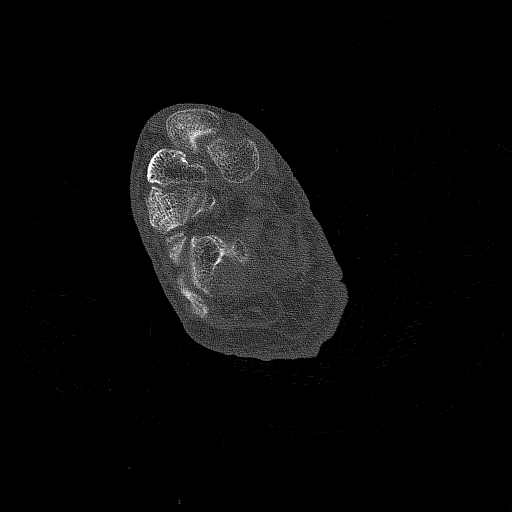
[im 358/573  soft-tissue]
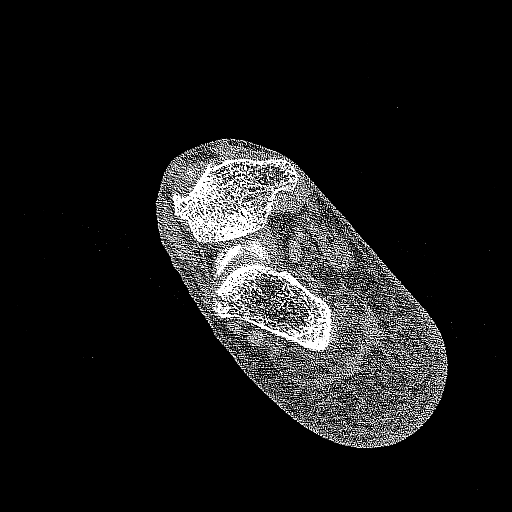
[im 358/573  bone]
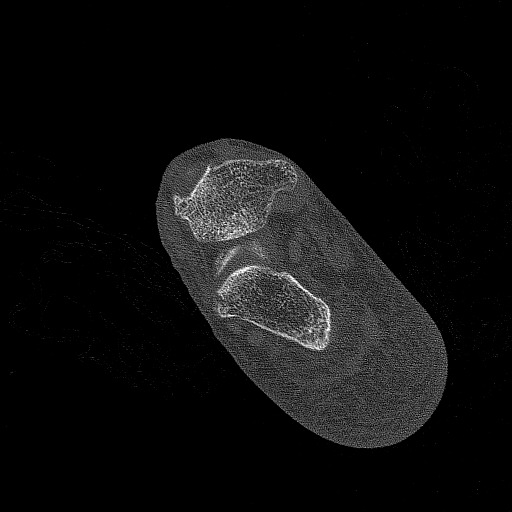
[im 430/573  bone]
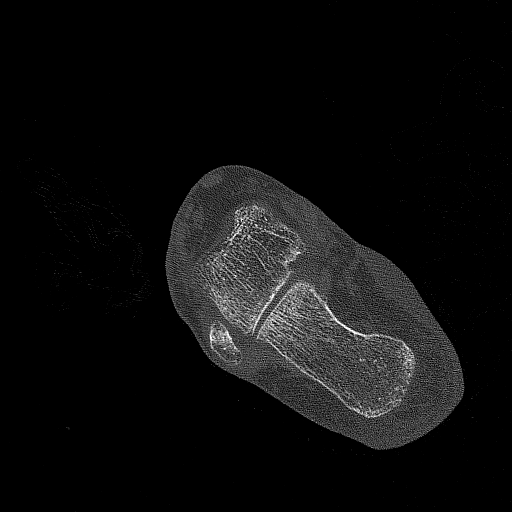
[im 501/573  bone]
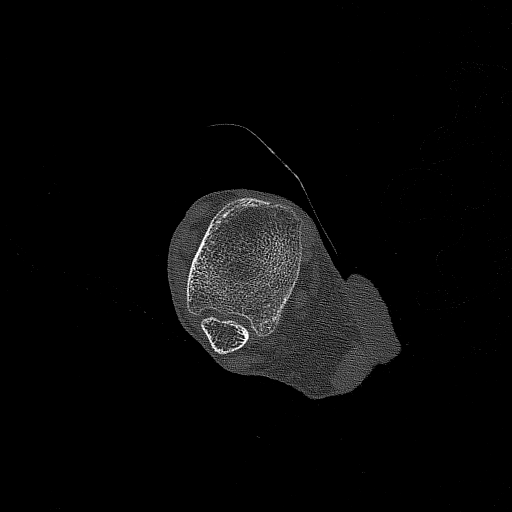

[7 of 14 positions shown; findings below may reference images not displayed]

FINDINGS: The study is limited by patient motion artifacts which simulate
fracture deformities on reformats..

Bones/Joint/Cartilage

Osteopenic appearance of the included ankle and foot. No frank bone
destruction, fracture nor significant erosive change.

Ligaments

Suboptimally assessed by CT.

Muscles and Tendons

Generalized atrophy.  Intact tendons crossing the ankle joint.

Soft tissues

Soft tissue swelling along the medial aspect of the hindfoot.
IMPRESSION: Soft tissue swelling along the medial aspect of the hindfoot.
Osteopenic appearance of the bones without acute fracture, bone
destruction nor specific arthropathy.

## 2019-05-03 IMAGING — CT CT HIP*R* W/O CM
2 series · 10 of 14 positions shown, 12 images · non-contrast
Comparison: None.

CLINICAL DATA: Right hip pain

EXAM:
CT OF THE RIGHT HIP WITHOUT CONTRAST
TECHNIQUE: Multidetector CT imaging of the right hip was performed according to
the standard protocol. Multiplanar CT image reconstructions were
also generated.

[Series 5: l spine 2.0 st · axial · 0.29mm/px · z∈[+1142,+1272]mm · 5 of 99 slices shown, 7 images]
[im 17/99  soft-tissue]
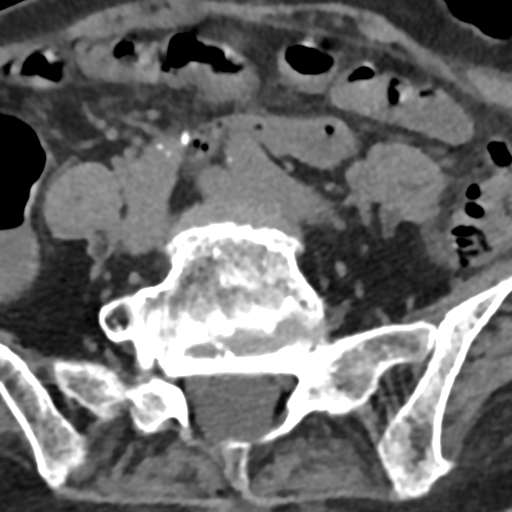
[im 17/99  bone]
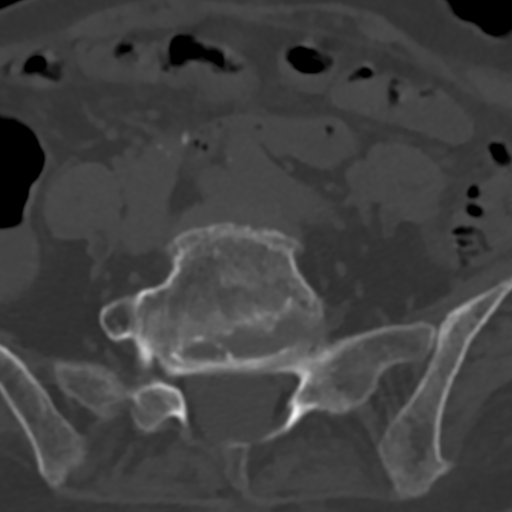
[im 33/99  bone]
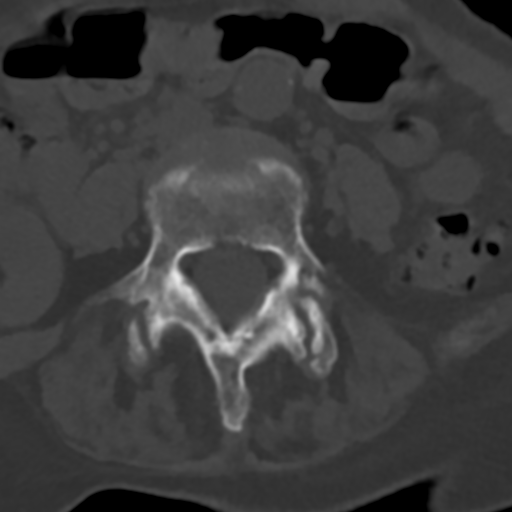
[im 50/99  bone]
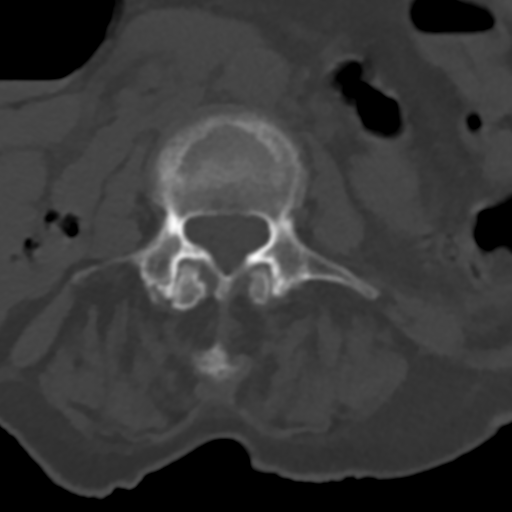
[im 66/99  bone]
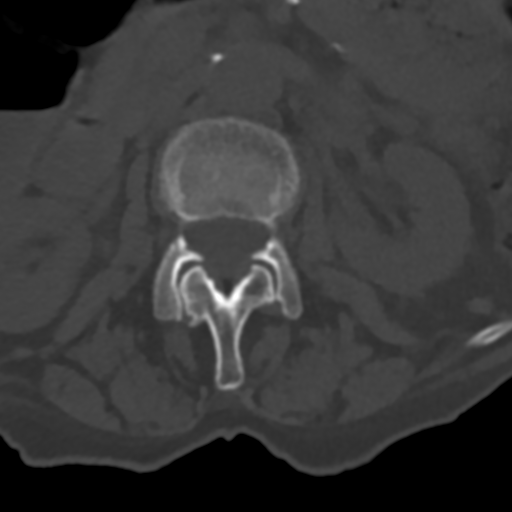
[im 82/99  soft-tissue]
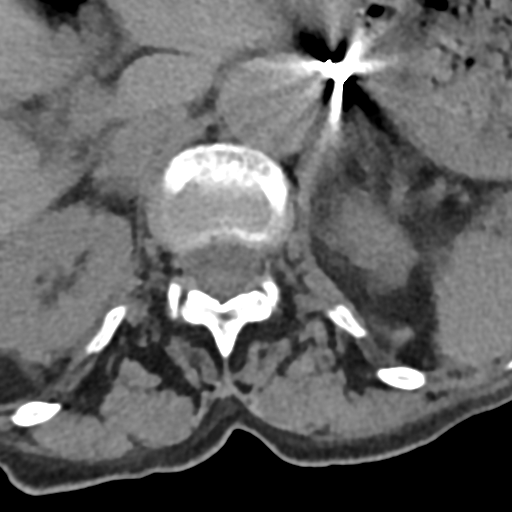
[im 82/99  bone]
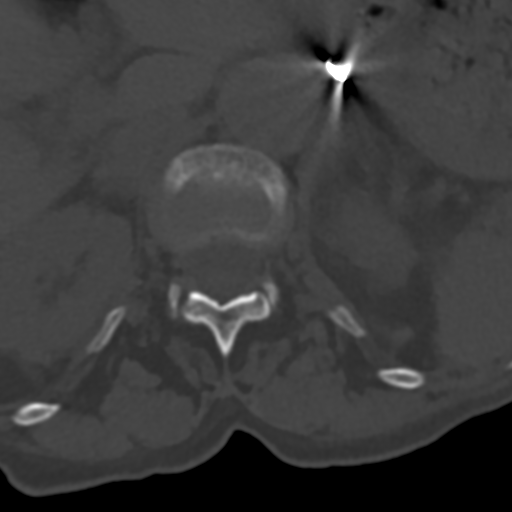

[Series 6: pelvis 2.0 st · axial · 0.34mm/px · z∈[+942,+1062]mm · 5 of 91 slices shown]
[im 16/91  bone]
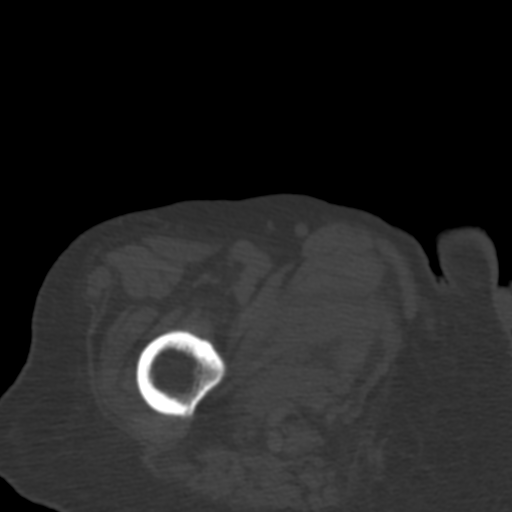
[im 31/91  bone]
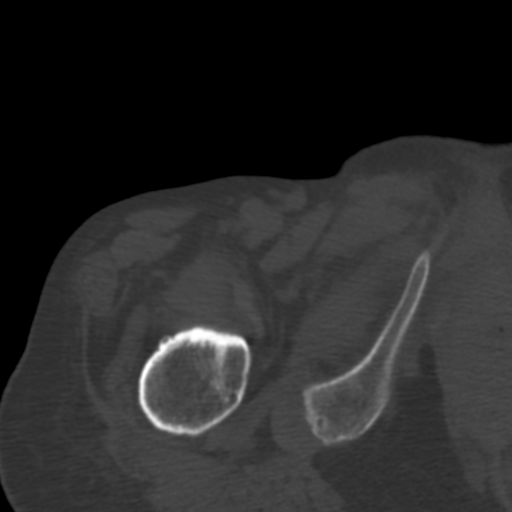
[im 46/91  bone]
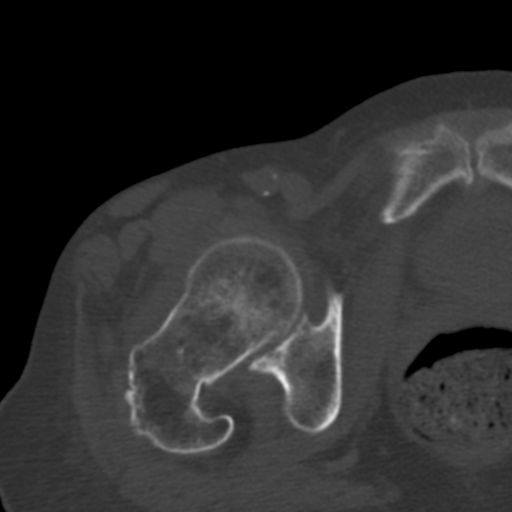
[im 61/91  bone]
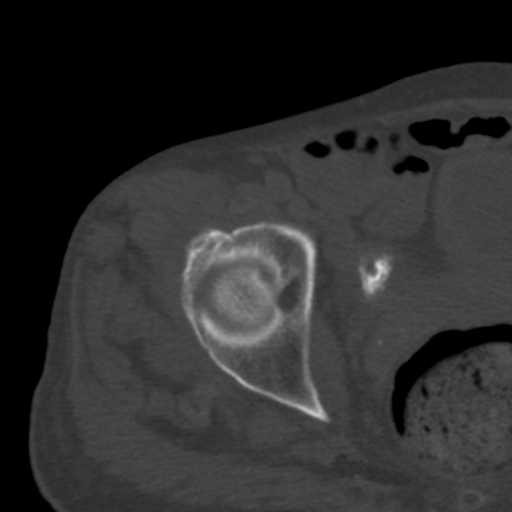
[im 76/91  bone]
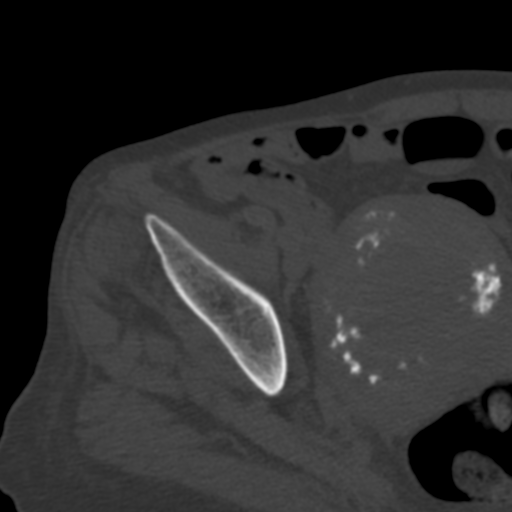

[10 of 14 positions shown; findings below may reference images not displayed]

FINDINGS: Bones/Joint/Cartilage

Subchondral cystic change of the femoral head likely related to
full-thickness chondral fissures of the femoral head cartilage. No
loss of sphericity of the femoral head or flattening. Moderate joint
space narrowing of the right hip joint along the weight-bearing
portion. No joint effusion or fracture.

Ligaments

Suboptimally assessed by CT.

Muscles and Tendons

No focal soft tissue mass or mineralization. No significant muscle
atrophy.

Soft tissues

Fibroid uterus with coarse calcifications noted.
IMPRESSION: 1. Osteoarthritis of the right hip. Subchondral cystic change of the
femoral head more often represents stigmata of full-thickness
cartilaginous loss.
2. Moderate joint space narrowing of the right hip would also be in
keeping with chondrosis and chondral thinning.
3. Calcified uterine fibroids.
